# Patient Record
Sex: Female | Born: 1955
Health system: Southern US, Community
[De-identification: ages and names within clinical notes are randomized; demographics above are authoritative.]

## PROBLEM LIST (undated history)

## (undated) DIAGNOSIS — E559 Vitamin D deficiency, unspecified: Secondary | ICD-10-CM

## (undated) DIAGNOSIS — N6310 Unspecified lump in the right breast, unspecified quadrant: Secondary | ICD-10-CM

## (undated) DIAGNOSIS — F32A Depression, unspecified: Secondary | ICD-10-CM

## (undated) DIAGNOSIS — I739 Peripheral vascular disease, unspecified: Secondary | ICD-10-CM

## (undated) DIAGNOSIS — E039 Hypothyroidism, unspecified: Secondary | ICD-10-CM

## (undated) DIAGNOSIS — I10 Essential (primary) hypertension: Secondary | ICD-10-CM

## (undated) DIAGNOSIS — E119 Type 2 diabetes mellitus without complications: Secondary | ICD-10-CM

## (undated) DIAGNOSIS — F419 Anxiety disorder, unspecified: Secondary | ICD-10-CM

## (undated) DIAGNOSIS — C801 Malignant (primary) neoplasm, unspecified: Secondary | ICD-10-CM

## (undated) DIAGNOSIS — G629 Polyneuropathy, unspecified: Secondary | ICD-10-CM

## (undated) DIAGNOSIS — E785 Hyperlipidemia, unspecified: Secondary | ICD-10-CM

## (undated) DIAGNOSIS — F329 Major depressive disorder, single episode, unspecified: Secondary | ICD-10-CM

## (undated) DIAGNOSIS — K5792 Diverticulitis of intestine, part unspecified, without perforation or abscess without bleeding: Secondary | ICD-10-CM

## (undated) HISTORY — DX: Vitamin D deficiency, unspecified: E55.9

## (undated) HISTORY — DX: Polyneuropathy, unspecified: G62.9

## (undated) HISTORY — PX: BREAST EXCISIONAL BIOPSY: SUR124

## (undated) HISTORY — DX: Malignant (primary) neoplasm, unspecified: C80.1

## (undated) HISTORY — DX: Peripheral vascular disease, unspecified: I73.9

## (undated) HISTORY — PX: CHOLECYSTECTOMY: SHX55

## (undated) HISTORY — DX: Diverticulitis of intestine, part unspecified, without perforation or abscess without bleeding: K57.92

---

## 1997-07-28 ENCOUNTER — Ambulatory Visit (HOSPITAL_COMMUNITY): Admission: RE | Admit: 1997-07-28 | Discharge: 1997-07-28 | Payer: Self-pay | Admitting: Gynecology

## 1998-05-20 ENCOUNTER — Other Ambulatory Visit: Admission: RE | Admit: 1998-05-20 | Discharge: 1998-05-20 | Payer: Self-pay | Admitting: Gynecology

## 1999-05-26 ENCOUNTER — Encounter: Admission: RE | Admit: 1999-05-26 | Discharge: 1999-05-26 | Payer: Self-pay | Admitting: Gynecology

## 1999-05-26 ENCOUNTER — Encounter: Payer: Self-pay | Admitting: Gynecology

## 1999-05-26 ENCOUNTER — Other Ambulatory Visit: Admission: RE | Admit: 1999-05-26 | Discharge: 1999-05-26 | Payer: Self-pay | Admitting: Gynecology

## 1999-06-02 ENCOUNTER — Encounter: Admission: RE | Admit: 1999-06-02 | Discharge: 1999-06-02 | Payer: Self-pay | Admitting: Gynecology

## 1999-06-02 ENCOUNTER — Encounter: Payer: Self-pay | Admitting: Gynecology

## 2000-06-21 ENCOUNTER — Encounter: Payer: Self-pay | Admitting: Gynecology

## 2000-06-21 ENCOUNTER — Other Ambulatory Visit: Admission: RE | Admit: 2000-06-21 | Discharge: 2000-06-21 | Payer: Self-pay | Admitting: Gynecology

## 2000-06-21 ENCOUNTER — Encounter: Admission: RE | Admit: 2000-06-21 | Discharge: 2000-06-21 | Payer: Self-pay | Admitting: Gynecology

## 2001-08-01 ENCOUNTER — Encounter: Payer: Self-pay | Admitting: Gynecology

## 2001-08-01 ENCOUNTER — Encounter: Admission: RE | Admit: 2001-08-01 | Discharge: 2001-08-01 | Payer: Self-pay | Admitting: Gynecology

## 2001-08-01 ENCOUNTER — Other Ambulatory Visit: Admission: RE | Admit: 2001-08-01 | Discharge: 2001-08-01 | Payer: Self-pay | Admitting: Gynecology

## 2001-11-13 ENCOUNTER — Ambulatory Visit (HOSPITAL_COMMUNITY): Admission: RE | Admit: 2001-11-13 | Discharge: 2001-11-13 | Payer: Self-pay | Admitting: Gastroenterology

## 2002-08-21 ENCOUNTER — Encounter: Admission: RE | Admit: 2002-08-21 | Discharge: 2002-08-21 | Payer: Self-pay | Admitting: Gynecology

## 2002-08-21 ENCOUNTER — Encounter: Payer: Self-pay | Admitting: Gynecology

## 2002-08-21 ENCOUNTER — Other Ambulatory Visit: Admission: RE | Admit: 2002-08-21 | Discharge: 2002-08-21 | Payer: Self-pay | Admitting: Gynecology

## 2003-09-03 ENCOUNTER — Other Ambulatory Visit: Admission: RE | Admit: 2003-09-03 | Discharge: 2003-09-03 | Payer: Self-pay | Admitting: Gynecology

## 2003-09-03 ENCOUNTER — Encounter: Admission: RE | Admit: 2003-09-03 | Discharge: 2003-09-03 | Payer: Self-pay | Admitting: Gynecology

## 2004-09-08 ENCOUNTER — Other Ambulatory Visit: Admission: RE | Admit: 2004-09-08 | Discharge: 2004-09-08 | Payer: Self-pay | Admitting: Gynecology

## 2004-09-08 ENCOUNTER — Encounter: Admission: RE | Admit: 2004-09-08 | Discharge: 2004-09-08 | Payer: Self-pay | Admitting: Gynecology

## 2004-11-23 ENCOUNTER — Encounter: Admission: RE | Admit: 2004-11-23 | Discharge: 2004-11-23 | Payer: Self-pay | Admitting: Gynecology

## 2005-09-14 ENCOUNTER — Other Ambulatory Visit: Admission: RE | Admit: 2005-09-14 | Discharge: 2005-09-14 | Payer: Self-pay | Admitting: Gynecology

## 2005-09-21 ENCOUNTER — Encounter: Admission: RE | Admit: 2005-09-21 | Discharge: 2005-09-21 | Payer: Self-pay | Admitting: Gynecology

## 2005-10-05 ENCOUNTER — Encounter: Admission: RE | Admit: 2005-10-05 | Discharge: 2005-10-05 | Payer: Self-pay | Admitting: Gynecology

## 2006-10-04 ENCOUNTER — Encounter: Admission: RE | Admit: 2006-10-04 | Discharge: 2006-10-04 | Payer: Self-pay | Admitting: Gynecology

## 2006-10-04 ENCOUNTER — Other Ambulatory Visit: Admission: RE | Admit: 2006-10-04 | Discharge: 2006-10-04 | Payer: Self-pay | Admitting: Gynecology

## 2007-10-17 ENCOUNTER — Encounter: Admission: RE | Admit: 2007-10-17 | Discharge: 2007-10-17 | Payer: Self-pay | Admitting: Gynecology

## 2008-10-22 ENCOUNTER — Encounter: Admission: RE | Admit: 2008-10-22 | Discharge: 2008-10-22 | Payer: Self-pay | Admitting: Gynecology

## 2009-12-01 ENCOUNTER — Encounter: Admission: RE | Admit: 2009-12-01 | Discharge: 2009-12-01 | Payer: Self-pay | Admitting: Gynecology

## 2010-02-11 ENCOUNTER — Encounter: Payer: Self-pay | Admitting: Gynecology

## 2010-11-06 ENCOUNTER — Other Ambulatory Visit: Payer: Self-pay | Admitting: Gynecology

## 2010-11-06 DIAGNOSIS — Z1231 Encounter for screening mammogram for malignant neoplasm of breast: Secondary | ICD-10-CM

## 2010-12-08 ENCOUNTER — Ambulatory Visit
Admission: RE | Admit: 2010-12-08 | Discharge: 2010-12-08 | Disposition: A | Discharge status: Home / Self Care | Payer: BC Managed Care – PPO | Source: Ambulatory Visit | Attending: Gynecology | Admitting: Gynecology

## 2010-12-08 DIAGNOSIS — Z1231 Encounter for screening mammogram for malignant neoplasm of breast: Secondary | ICD-10-CM

## 2011-11-27 ENCOUNTER — Other Ambulatory Visit: Payer: Self-pay | Admitting: Gynecology

## 2011-11-27 DIAGNOSIS — Z1231 Encounter for screening mammogram for malignant neoplasm of breast: Secondary | ICD-10-CM

## 2011-12-14 ENCOUNTER — Ambulatory Visit
Admission: RE | Admit: 2011-12-14 | Discharge: 2011-12-14 | Disposition: A | Discharge status: Home / Self Care | Payer: BC Managed Care – PPO | Source: Ambulatory Visit | Attending: Gynecology | Admitting: Gynecology

## 2011-12-14 DIAGNOSIS — Z1231 Encounter for screening mammogram for malignant neoplasm of breast: Secondary | ICD-10-CM

## 2012-11-25 ENCOUNTER — Other Ambulatory Visit: Payer: Self-pay

## 2012-11-25 DIAGNOSIS — Z1231 Encounter for screening mammogram for malignant neoplasm of breast: Secondary | ICD-10-CM

## 2012-12-26 ENCOUNTER — Ambulatory Visit
Admission: RE | Admit: 2012-12-26 | Discharge: 2012-12-26 | Disposition: A | Discharge status: Home / Self Care | Payer: BC Managed Care – PPO | Source: Ambulatory Visit

## 2012-12-26 DIAGNOSIS — Z1231 Encounter for screening mammogram for malignant neoplasm of breast: Secondary | ICD-10-CM

## 2013-10-14 ENCOUNTER — Other Ambulatory Visit: Payer: Self-pay | Admitting: Legal Medicine

## 2013-10-14 DIAGNOSIS — K828 Other specified diseases of gallbladder: Secondary | ICD-10-CM

## 2013-10-16 ENCOUNTER — Ambulatory Visit
Admission: RE | Admit: 2013-10-16 | Discharge: 2013-10-16 | Disposition: A | Discharge status: Home / Self Care | Payer: BC Managed Care – PPO | Source: Ambulatory Visit | Attending: Legal Medicine | Admitting: Legal Medicine

## 2013-10-16 DIAGNOSIS — K828 Other specified diseases of gallbladder: Secondary | ICD-10-CM

## 2014-06-14 ENCOUNTER — Other Ambulatory Visit (INDEPENDENT_AMBULATORY_CARE_PROVIDER_SITE_OTHER): Payer: Self-pay

## 2014-06-14 DIAGNOSIS — N6320 Unspecified lump in the left breast, unspecified quadrant: Secondary | ICD-10-CM

## 2014-06-15 ENCOUNTER — Ambulatory Visit
Admission: RE | Admit: 2014-06-15 | Discharge: 2014-06-15 | Disposition: A | Discharge status: Home / Self Care | Payer: BLUE CROSS/BLUE SHIELD | Source: Ambulatory Visit | Attending: General Surgery | Admitting: General Surgery

## 2014-06-15 ENCOUNTER — Ambulatory Visit: Admission: RE | Admit: 2014-06-15 | Payer: BLUE CROSS/BLUE SHIELD | Source: Ambulatory Visit

## 2014-06-15 DIAGNOSIS — N6320 Unspecified lump in the left breast, unspecified quadrant: Secondary | ICD-10-CM

## 2014-12-31 ENCOUNTER — Other Ambulatory Visit: Payer: Self-pay | Admitting: Obstetrics and Gynecology

## 2014-12-31 ENCOUNTER — Other Ambulatory Visit (HOSPITAL_COMMUNITY)
Admission: RE | Admit: 2014-12-31 | Discharge: 2014-12-31 | Disposition: A | Discharge status: Home / Self Care | Payer: BLUE CROSS/BLUE SHIELD | Source: Ambulatory Visit | Attending: Obstetrics and Gynecology | Admitting: Obstetrics and Gynecology

## 2014-12-31 DIAGNOSIS — Z01419 Encounter for gynecological examination (general) (routine) without abnormal findings: Secondary | ICD-10-CM | POA: Insufficient documentation

## 2014-12-31 DIAGNOSIS — Z1151 Encounter for screening for human papillomavirus (HPV): Secondary | ICD-10-CM | POA: Diagnosis not present

## 2015-01-03 LAB — CYTOLOGY - PAP

## 2015-05-13 DIAGNOSIS — H16223 Keratoconjunctivitis sicca, not specified as Sjogren's, bilateral: Secondary | ICD-10-CM | POA: Diagnosis not present

## 2015-05-13 DIAGNOSIS — H40013 Open angle with borderline findings, low risk, bilateral: Secondary | ICD-10-CM | POA: Diagnosis not present

## 2015-05-13 DIAGNOSIS — H35362 Drusen (degenerative) of macula, left eye: Secondary | ICD-10-CM | POA: Diagnosis not present

## 2015-05-26 ENCOUNTER — Other Ambulatory Visit: Payer: Self-pay

## 2015-05-26 DIAGNOSIS — Z1231 Encounter for screening mammogram for malignant neoplasm of breast: Secondary | ICD-10-CM

## 2015-06-17 ENCOUNTER — Ambulatory Visit
Admission: RE | Admit: 2015-06-17 | Discharge: 2015-06-17 | Disposition: A | Discharge status: Home / Self Care | Payer: BLUE CROSS/BLUE SHIELD | Source: Ambulatory Visit

## 2015-06-17 DIAGNOSIS — I10 Essential (primary) hypertension: Secondary | ICD-10-CM | POA: Diagnosis not present

## 2015-06-17 DIAGNOSIS — E039 Hypothyroidism, unspecified: Secondary | ICD-10-CM | POA: Diagnosis not present

## 2015-06-17 DIAGNOSIS — E785 Hyperlipidemia, unspecified: Secondary | ICD-10-CM | POA: Diagnosis not present

## 2015-06-17 DIAGNOSIS — Z1231 Encounter for screening mammogram for malignant neoplasm of breast: Secondary | ICD-10-CM | POA: Diagnosis not present

## 2015-06-17 DIAGNOSIS — R7303 Prediabetes: Secondary | ICD-10-CM | POA: Diagnosis not present

## 2015-06-22 ENCOUNTER — Other Ambulatory Visit: Payer: Self-pay | Admitting: *Deleted

## 2015-06-22 ENCOUNTER — Other Ambulatory Visit: Payer: Self-pay | Admitting: General Surgery

## 2015-06-22 DIAGNOSIS — R928 Other abnormal and inconclusive findings on diagnostic imaging of breast: Secondary | ICD-10-CM

## 2015-06-24 ENCOUNTER — Ambulatory Visit
Admission: RE | Admit: 2015-06-24 | Discharge: 2015-06-24 | Disposition: A | Discharge status: Home / Self Care | Payer: BLUE CROSS/BLUE SHIELD | Source: Ambulatory Visit | Attending: General Surgery | Admitting: General Surgery

## 2015-06-24 ENCOUNTER — Other Ambulatory Visit: Payer: Self-pay | Admitting: General Surgery

## 2015-06-24 DIAGNOSIS — R928 Other abnormal and inconclusive findings on diagnostic imaging of breast: Secondary | ICD-10-CM

## 2015-06-24 DIAGNOSIS — N63 Unspecified lump in breast: Secondary | ICD-10-CM | POA: Diagnosis not present

## 2015-06-24 DIAGNOSIS — R922 Inconclusive mammogram: Secondary | ICD-10-CM | POA: Diagnosis not present

## 2015-07-01 ENCOUNTER — Other Ambulatory Visit: Payer: Self-pay | Admitting: General Surgery

## 2015-07-01 DIAGNOSIS — R928 Other abnormal and inconclusive findings on diagnostic imaging of breast: Secondary | ICD-10-CM

## 2015-07-04 ENCOUNTER — Ambulatory Visit
Admission: RE | Admit: 2015-07-04 | Discharge: 2015-07-04 | Disposition: A | Discharge status: Home / Self Care | Payer: BLUE CROSS/BLUE SHIELD | Source: Ambulatory Visit | Attending: General Surgery | Admitting: General Surgery

## 2015-07-04 ENCOUNTER — Other Ambulatory Visit: Payer: Self-pay | Admitting: Obstetrics & Gynecology

## 2015-07-04 DIAGNOSIS — N63 Unspecified lump in breast: Secondary | ICD-10-CM | POA: Diagnosis not present

## 2015-07-04 DIAGNOSIS — D241 Benign neoplasm of right breast: Secondary | ICD-10-CM | POA: Diagnosis not present

## 2015-07-04 DIAGNOSIS — N6011 Diffuse cystic mastopathy of right breast: Secondary | ICD-10-CM | POA: Diagnosis not present

## 2015-07-04 DIAGNOSIS — N6489 Other specified disorders of breast: Secondary | ICD-10-CM | POA: Diagnosis not present

## 2015-07-04 DIAGNOSIS — R928 Other abnormal and inconclusive findings on diagnostic imaging of breast: Secondary | ICD-10-CM

## 2015-07-05 ENCOUNTER — Other Ambulatory Visit: Payer: Self-pay | Admitting: Obstetrics & Gynecology

## 2015-07-05 DIAGNOSIS — N6310 Unspecified lump in the right breast, unspecified quadrant: Secondary | ICD-10-CM

## 2015-07-06 ENCOUNTER — Other Ambulatory Visit: Payer: Self-pay | Admitting: Obstetrics & Gynecology

## 2015-07-06 DIAGNOSIS — N6310 Unspecified lump in the right breast, unspecified quadrant: Secondary | ICD-10-CM

## 2015-07-07 ENCOUNTER — Ambulatory Visit
Admission: RE | Admit: 2015-07-07 | Discharge: 2015-07-07 | Disposition: A | Discharge status: Home / Self Care | Payer: BLUE CROSS/BLUE SHIELD | Source: Ambulatory Visit | Attending: Obstetrics & Gynecology | Admitting: Obstetrics & Gynecology

## 2015-07-07 DIAGNOSIS — N6310 Unspecified lump in the right breast, unspecified quadrant: Secondary | ICD-10-CM

## 2015-07-07 DIAGNOSIS — N6011 Diffuse cystic mastopathy of right breast: Secondary | ICD-10-CM | POA: Diagnosis not present

## 2015-07-07 DIAGNOSIS — N649 Disorder of breast, unspecified: Secondary | ICD-10-CM | POA: Diagnosis not present

## 2015-07-29 ENCOUNTER — Other Ambulatory Visit: Payer: Self-pay | Admitting: General Surgery

## 2015-07-29 DIAGNOSIS — N6489 Other specified disorders of breast: Secondary | ICD-10-CM

## 2015-08-08 ENCOUNTER — Other Ambulatory Visit: Payer: Self-pay | Admitting: General Surgery

## 2015-08-08 DIAGNOSIS — N6489 Other specified disorders of breast: Secondary | ICD-10-CM

## 2015-09-20 ENCOUNTER — Encounter (HOSPITAL_BASED_OUTPATIENT_CLINIC_OR_DEPARTMENT_OTHER): Payer: Self-pay | Admitting: *Deleted

## 2015-09-22 ENCOUNTER — Other Ambulatory Visit: Payer: Self-pay

## 2015-09-22 ENCOUNTER — Ambulatory Visit
Admission: RE | Admit: 2015-09-22 | Discharge: 2015-09-22 | Disposition: A | Discharge status: Home / Self Care | Payer: BLUE CROSS/BLUE SHIELD | Source: Ambulatory Visit | Attending: General Surgery | Admitting: General Surgery

## 2015-09-22 ENCOUNTER — Encounter (HOSPITAL_BASED_OUTPATIENT_CLINIC_OR_DEPARTMENT_OTHER)
Admission: RE | Admit: 2015-09-22 | Discharge: 2015-09-22 | Disposition: A | Discharge status: Home / Self Care | Payer: BLUE CROSS/BLUE SHIELD | Source: Ambulatory Visit | Attending: General Surgery | Admitting: General Surgery

## 2015-09-22 DIAGNOSIS — N6489 Other specified disorders of breast: Secondary | ICD-10-CM

## 2015-09-22 DIAGNOSIS — R928 Other abnormal and inconclusive findings on diagnostic imaging of breast: Secondary | ICD-10-CM | POA: Diagnosis not present

## 2015-09-22 DIAGNOSIS — E119 Type 2 diabetes mellitus without complications: Secondary | ICD-10-CM | POA: Diagnosis not present

## 2015-09-22 DIAGNOSIS — E7800 Pure hypercholesterolemia, unspecified: Secondary | ICD-10-CM | POA: Diagnosis not present

## 2015-09-22 DIAGNOSIS — F419 Anxiety disorder, unspecified: Secondary | ICD-10-CM | POA: Diagnosis not present

## 2015-09-22 DIAGNOSIS — E039 Hypothyroidism, unspecified: Secondary | ICD-10-CM | POA: Diagnosis not present

## 2015-09-22 DIAGNOSIS — N6021 Fibroadenosis of right breast: Secondary | ICD-10-CM | POA: Diagnosis not present

## 2015-09-22 DIAGNOSIS — Z7984 Long term (current) use of oral hypoglycemic drugs: Secondary | ICD-10-CM | POA: Diagnosis not present

## 2015-09-22 DIAGNOSIS — Z7982 Long term (current) use of aspirin: Secondary | ICD-10-CM | POA: Diagnosis not present

## 2015-09-22 DIAGNOSIS — Z85820 Personal history of malignant melanoma of skin: Secondary | ICD-10-CM | POA: Diagnosis not present

## 2015-09-22 LAB — BASIC METABOLIC PANEL
Anion gap: 9 (ref 5–15)
BUN: 12 mg/dL (ref 6–20)
CO2: 24 mmol/L (ref 22–32)
Calcium: 9.4 mg/dL (ref 8.9–10.3)
Chloride: 101 mmol/L (ref 101–111)
Creatinine, Ser: 0.85 mg/dL (ref 0.44–1.00)
GFR calc Af Amer: >60 mL/min (ref >60)
GFR calc non Af Amer: >60 mL/min (ref >60)
Glucose, Bld: 104 mg/dL — ABNORMAL HIGH (ref 65–99)
Potassium: 4.1 mmol/L (ref 3.5–5.1)
Sodium: 134 mmol/L — ABNORMAL LOW (ref 135–145)

## 2015-09-22 NOTE — Progress Notes (Signed)
Pt given 8 oz bottle of water with written and verbal instructions to drink at 530 morning of surgery and no other liquids after midnight.  Teach back and pt voiced understanding

## 2015-09-22 NOTE — H&P (Signed)
History of Present Illness   Patient words: Cassandra Fox right breast.  The patient is a 60 year old female who presents with a complaint of Breast problems. She returns to the office after recent breast biopsies. I saw her in May with some swelling and tenderness in the left breast consistent with fibrocystic disease. Subsequent imaging and ultrasound were negative and this resolved. However she recently presented for routine screening in early June. Possible distortion was seen in the right breast prompting diagnostic studies. Tomosynthesis showed an area of distortion in the outer right breast as well as a rounded mass in the lateral portion of the right breast. Subsequently these areas were biopsied with ultrasound and stereotactic biopsy and showed benign fibrocystic change and a fibroadenoma. Subsequently then diagnostic post biopsy mammogram was obtained showing a coil marker in the area of distortion and a ribbon marker at the site of the fibroadenoma. However a another area of distortion was seen about 2 cm posterior and medial to the coil shaped marker. On 6:15 the patient had an additional biopsy of this mower posterior area. The original result of fibrocystic change was also felt to be discordant. This was done with an X-shaped clip. Pathology has revealed return showing a complex sclerosing lesion. She is referred for consideration for excisional biopsy. As noted previously she has no previous history of breast biopsies or specific problems other than fibrocystic symptoms. No family history of breast cancer.   Problem List/Past Medical  LEFT BREAST MASS (N63)  Other Problems   Melanoma Thyroid Disease Hypercholesterolemia Cholelithiasis Hemorrhoids High blood pressure Anxiety Disorder  Past Surgical History  Gallbladder Surgery - Open  Diagnostic Studies History  Colonoscopy 1-5 years ago Mammogram 1-3 years ago  Allergies  Codeine Sulfate *ANALGESICS -  OPIOID* Nausea.   Medication History   Losartan Potassium-HCTZ (50-12.5MG  Tablet, Oral) Active. Simvastatin (20MG  Tablet, Oral daily) Active. Aspirin (81MG  Tablet, Oral) Active. Citalopram Hydrobromide (20MG  Tablet, Oral) Active. Levothyroxine Sodium (100MCG Tablet, Oral half a tablet a day) Active. Minivelle (0.075MG /24HR Patch TW, Transdermal) Active. Xanax (0.5MG  Tablet, Oral) Active. Progesterone Micronized (200MG  Capsule, Oral) Active. Multivitamin Adult (Oral) Active. MetFORMIN HCl (500MG  Tablet, Oral) Active. Claritin (10MG  Capsule, Oral) Active. Medications Reconciled  Social History   Caffeine use Carbonated beverages, Coffee, Tea. No drug use Tobacco use Current every day smoker. Alcohol use Occasional alcohol use.  Family History   Colon Cancer Mother. Colon Polyps Brother, Sister. Diabetes Mellitus Mother. Melanoma Daughter, Father. Thyroid problems Mother.  Pregnancy / Birth History  Age at menarche 80 years. Maternal age 43-30 Para 35 Age of menopause 58-50 Contraceptive History Oral contraceptives. Gravida 3  Vitals   Weight: 155.8 lb Height: 65in Body Surface Area: 1.78 m Body Mass Index: 25.93 kg/m  Temp.: 17F  Pulse: 62 (Regular)  BP: 128/72 (Sitting, Left Arm, Standard)       Physical Exam   The physical exam findings are as follows: Note:General: Alert, well-developed and well nourished Caucasian female, in no distress Skin: Warm and dry without rash or infection. HEENT: No palpable masses or thyromegaly. Sclera nonicteric. Lymph nodes: No cervical, supraclavicular, nodes palpable. Breasts: Some bruising and nodularity outer right breast likely postbiopsy change. No other palpable masses in the breast or skin changes or nipple discharge. Lungs: Breath sounds clear and equal. No wheezing or increased work of breathing. Cardiovascular: Regular rate and rhythm without murmer. No JVD or  edema. Extremities: No edema or joint swelling or deformity. No chronic venous stasis changes. Neurologic: Alert and  fully oriented. Gait normal. No focal weakness. Psychiatric: Normal mood and affect. Thought content appropriate with normal judgement and insight    Assessment & Plan   RADIAL SCAR OF BREAST (N64.89) Impression: Radial scar or complex sclerosing lesion right breast status post 3 recent core biopsies. The area of concern is posterior in the outer breast with an X shaped clip. I discussed the findings in detail with the patient. We discussed the small chance of an underlying malignancy and that the standard recommendation is excisional biopsy. We discussed options of clinical follow-up. She prefers excision. We discussed the nature of the surgery and recovery and risks of anesthetic complications, bleeding, infection or possible need for further surgery if a malignancy were found. All her questions were answered. Current Plans Pt Education - [facility] Breast Biopsy HCI: discussed with patient and provided information. Radioactive seed localized right breast lumpectomy under general anesthesia as an outpatient

## 2015-09-23 ENCOUNTER — Encounter (HOSPITAL_BASED_OUTPATIENT_CLINIC_OR_DEPARTMENT_OTHER)
Admission: RE | Disposition: A | Discharge status: Home / Self Care | Payer: Self-pay | Source: Ambulatory Visit | Attending: General Surgery

## 2015-09-23 ENCOUNTER — Ambulatory Visit
Admission: RE | Admit: 2015-09-23 | Discharge: 2015-09-23 | Disposition: A | Discharge status: Home / Self Care | Payer: BLUE CROSS/BLUE SHIELD | Source: Ambulatory Visit | Attending: General Surgery | Admitting: General Surgery

## 2015-09-23 ENCOUNTER — Ambulatory Visit (HOSPITAL_BASED_OUTPATIENT_CLINIC_OR_DEPARTMENT_OTHER)
Admission: RE | Admit: 2015-09-23 | Discharge: 2015-09-23 | Disposition: A | Discharge status: Home / Self Care | Payer: BLUE CROSS/BLUE SHIELD | Source: Ambulatory Visit | Attending: General Surgery | Admitting: General Surgery

## 2015-09-23 ENCOUNTER — Ambulatory Visit (HOSPITAL_BASED_OUTPATIENT_CLINIC_OR_DEPARTMENT_OTHER): Payer: BLUE CROSS/BLUE SHIELD | Admitting: Certified Registered"

## 2015-09-23 ENCOUNTER — Encounter (HOSPITAL_BASED_OUTPATIENT_CLINIC_OR_DEPARTMENT_OTHER): Payer: Self-pay | Admitting: Certified Registered"

## 2015-09-23 DIAGNOSIS — F419 Anxiety disorder, unspecified: Secondary | ICD-10-CM | POA: Diagnosis not present

## 2015-09-23 DIAGNOSIS — N6489 Other specified disorders of breast: Secondary | ICD-10-CM

## 2015-09-23 DIAGNOSIS — R928 Other abnormal and inconclusive findings on diagnostic imaging of breast: Secondary | ICD-10-CM | POA: Diagnosis not present

## 2015-09-23 DIAGNOSIS — E039 Hypothyroidism, unspecified: Secondary | ICD-10-CM | POA: Insufficient documentation

## 2015-09-23 DIAGNOSIS — N6021 Fibroadenosis of right breast: Secondary | ICD-10-CM | POA: Diagnosis not present

## 2015-09-23 DIAGNOSIS — E7800 Pure hypercholesterolemia, unspecified: Secondary | ICD-10-CM | POA: Diagnosis not present

## 2015-09-23 DIAGNOSIS — R920 Mammographic microcalcification found on diagnostic imaging of breast: Secondary | ICD-10-CM | POA: Diagnosis not present

## 2015-09-23 DIAGNOSIS — Z7984 Long term (current) use of oral hypoglycemic drugs: Secondary | ICD-10-CM | POA: Insufficient documentation

## 2015-09-23 DIAGNOSIS — Z7982 Long term (current) use of aspirin: Secondary | ICD-10-CM | POA: Insufficient documentation

## 2015-09-23 DIAGNOSIS — Z85820 Personal history of malignant melanoma of skin: Secondary | ICD-10-CM | POA: Diagnosis not present

## 2015-09-23 DIAGNOSIS — N6011 Diffuse cystic mastopathy of right breast: Secondary | ICD-10-CM | POA: Diagnosis not present

## 2015-09-23 DIAGNOSIS — E119 Type 2 diabetes mellitus without complications: Secondary | ICD-10-CM | POA: Insufficient documentation

## 2015-09-23 HISTORY — PX: BREAST LUMPECTOMY WITH RADIOACTIVE SEED LOCALIZATION: SHX6424

## 2015-09-23 HISTORY — DX: Unspecified lump in the right breast, unspecified quadrant: N63.10

## 2015-09-23 HISTORY — DX: Depression, unspecified: F32.A

## 2015-09-23 HISTORY — DX: Hypothyroidism, unspecified: E03.9

## 2015-09-23 HISTORY — DX: Hyperlipidemia, unspecified: E78.5

## 2015-09-23 HISTORY — DX: Anxiety disorder, unspecified: F41.9

## 2015-09-23 HISTORY — DX: Type 2 diabetes mellitus without complications: E11.9

## 2015-09-23 HISTORY — DX: Essential (primary) hypertension: I10

## 2015-09-23 HISTORY — DX: Major depressive disorder, single episode, unspecified: F32.9

## 2015-09-23 LAB — GLUCOSE, CAPILLARY
Glucose-Capillary: 107 mg/dL — ABNORMAL HIGH (ref 65–99)
Glucose-Capillary: 138 mg/dL — ABNORMAL HIGH (ref 65–99)

## 2015-09-23 SURGERY — BREAST LUMPECTOMY WITH RADIOACTIVE SEED LOCALIZATION
Anesthesia: General | Laterality: Right

## 2015-09-23 MED ORDER — PROPOFOL 10 MG/ML IV BOLUS
INTRAVENOUS | Status: DC | PRN
  Administered 2015-09-23: 150 mg via INTRAVENOUS

## 2015-09-23 MED ORDER — HYDROCODONE-ACETAMINOPHEN 5-325 MG PO TABS: 1.0000 | ORAL_TABLET | ORAL | 0 refills | Status: DC | PRN

## 2015-09-23 MED ORDER — LIDOCAINE 2% (20 MG/ML) 5 ML SYRINGE
INTRAMUSCULAR | Status: AC
Start: 2015-09-23 — End: 2015-09-23
  Filled 2015-09-23: qty 5

## 2015-09-23 MED ORDER — FENTANYL CITRATE (PF) 100 MCG/2ML IJ SOLN
50.0000 ug | INTRAMUSCULAR | Status: DC | PRN
  Administered 2015-09-23 (×2): 50 ug via INTRAVENOUS

## 2015-09-23 MED ORDER — MIDAZOLAM HCL 2 MG/2ML IJ SOLN: 1.0000 mg | INTRAMUSCULAR | Status: DC | PRN

## 2015-09-23 MED ORDER — GABAPENTIN 300 MG PO CAPS
300.0000 mg | ORAL_CAPSULE | ORAL | Status: AC
  Administered 2015-09-23: 300 mg via ORAL

## 2015-09-23 MED ORDER — CHLORHEXIDINE GLUCONATE CLOTH 2 % EX PADS: 6.0000 | MEDICATED_PAD | Freq: Once | CUTANEOUS | Status: DC

## 2015-09-23 MED ORDER — CELECOXIB 200 MG PO CAPS
ORAL_CAPSULE | ORAL | Status: AC
  Filled 2015-09-23: qty 2

## 2015-09-23 MED ORDER — PROPOFOL 10 MG/ML IV BOLUS
INTRAVENOUS | Status: AC
  Filled 2015-09-23: qty 20

## 2015-09-23 MED ORDER — ONDANSETRON HCL 4 MG/2ML IJ SOLN
INTRAMUSCULAR | Status: DC | PRN
  Administered 2015-09-23: 4 mg via INTRAVENOUS

## 2015-09-23 MED ORDER — FENTANYL CITRATE (PF) 100 MCG/2ML IJ SOLN
INTRAMUSCULAR | Status: AC
  Filled 2015-09-23: qty 2

## 2015-09-23 MED ORDER — SCOPOLAMINE 1 MG/3DAYS TD PT72: 1.0000 | MEDICATED_PATCH | Freq: Once | TRANSDERMAL | Status: DC | PRN

## 2015-09-23 MED ORDER — DEXAMETHASONE SODIUM PHOSPHATE 4 MG/ML IJ SOLN
INTRAMUSCULAR | Status: DC | PRN
  Administered 2015-09-23: 4 mg via INTRAVENOUS

## 2015-09-23 MED ORDER — GLYCOPYRROLATE 0.2 MG/ML IJ SOLN
0.2000 mg | Freq: Once | INTRAMUSCULAR | Status: AC | PRN
  Administered 2015-09-23: 0.1 mg via INTRAVENOUS

## 2015-09-23 MED ORDER — CELECOXIB 400 MG PO CAPS
400.0000 mg | ORAL_CAPSULE | ORAL | Status: AC
  Administered 2015-09-23: 400 mg via ORAL

## 2015-09-23 MED ORDER — BUPIVACAINE-EPINEPHRINE (PF) 0.25% -1:200000 IJ SOLN
INTRAMUSCULAR | Status: AC
  Filled 2015-09-23: qty 30

## 2015-09-23 MED ORDER — CEFAZOLIN SODIUM-DEXTROSE 2-4 GM/100ML-% IV SOLN
INTRAVENOUS | Status: AC
  Filled 2015-09-23: qty 100

## 2015-09-23 MED ORDER — OXYCODONE HCL 5 MG PO TABS
5.0000 mg | ORAL_TABLET | Freq: Once | ORAL | Status: AC
  Administered 2015-09-23: 5 mg via ORAL

## 2015-09-23 MED ORDER — BUPIVACAINE-EPINEPHRINE (PF) 0.25% -1:200000 IJ SOLN
INTRAMUSCULAR | Status: DC | PRN
  Administered 2015-09-23: 20 mL via PERINEURAL

## 2015-09-23 MED ORDER — DEXAMETHASONE SODIUM PHOSPHATE 10 MG/ML IJ SOLN
INTRAMUSCULAR | Status: AC
  Filled 2015-09-23: qty 1

## 2015-09-23 MED ORDER — LIDOCAINE HCL (CARDIAC) 20 MG/ML IV SOLN
INTRAVENOUS | Status: DC | PRN
  Administered 2015-09-23: 60 mg via INTRAVENOUS
  Administered 2015-09-23: 50 mg via INTRAVENOUS

## 2015-09-23 MED ORDER — ACETAMINOPHEN 500 MG PO TABS
ORAL_TABLET | ORAL | Status: AC
Start: 2015-09-23 — End: 2015-09-23
  Filled 2015-09-23: qty 2

## 2015-09-23 MED ORDER — FENTANYL CITRATE (PF) 100 MCG/2ML IJ SOLN
25.0000 ug | INTRAMUSCULAR | Status: DC | PRN
  Administered 2015-09-23: 50 ug via INTRAVENOUS

## 2015-09-23 MED ORDER — MIDAZOLAM HCL 2 MG/2ML IJ SOLN
INTRAMUSCULAR | Status: AC
  Filled 2015-09-23: qty 2

## 2015-09-23 MED ORDER — LACTATED RINGERS IV SOLN
INTRAVENOUS | Status: DC
  Administered 2015-09-23 (×2): via INTRAVENOUS

## 2015-09-23 MED ORDER — CEFAZOLIN SODIUM-DEXTROSE 2-4 GM/100ML-% IV SOLN
2.0000 g | INTRAVENOUS | Status: AC
Start: 2015-09-23 — End: 2015-09-23
  Administered 2015-09-23: 2 g via INTRAVENOUS

## 2015-09-23 MED ORDER — OXYCODONE HCL 5 MG PO TABS
ORAL_TABLET | ORAL | Status: AC
  Filled 2015-09-23: qty 1

## 2015-09-23 MED ORDER — ACETAMINOPHEN 500 MG PO TABS
1000.0000 mg | ORAL_TABLET | ORAL | Status: AC
  Administered 2015-09-23: 1000 mg via ORAL

## 2015-09-23 MED ORDER — PROMETHAZINE HCL 25 MG/ML IJ SOLN: 6.2500 mg | INTRAMUSCULAR | Status: DC | PRN

## 2015-09-23 MED ORDER — GABAPENTIN 300 MG PO CAPS
ORAL_CAPSULE | ORAL | Status: AC
Start: 2015-09-23 — End: 2015-09-23
  Filled 2015-09-23: qty 1

## 2015-09-23 MED ORDER — ONDANSETRON HCL 4 MG/2ML IJ SOLN
INTRAMUSCULAR | Status: AC
Start: 2015-09-23 — End: 2015-09-23
  Filled 2015-09-23: qty 2

## 2015-09-23 SURGICAL SUPPLY — 43 items
BINDER BREAST LRG (GAUZE/BANDAGES/DRESSINGS) IMPLANT
BINDER BREAST MEDIUM (GAUZE/BANDAGES/DRESSINGS) IMPLANT
BINDER BREAST XLRG (GAUZE/BANDAGES/DRESSINGS) IMPLANT
BINDER BREAST XXLRG (GAUZE/BANDAGES/DRESSINGS) IMPLANT
BLADE SURG 15 STRL LF DISP TIS (BLADE) ×1 IMPLANT
BLADE SURG 15 STRL SS (BLADE) ×1
CANISTER SUC SOCK COL 7IN (MISCELLANEOUS) IMPLANT
CANISTER SUCT 1200ML W/VALVE (MISCELLANEOUS) IMPLANT
CHLORAPREP W/TINT 26ML (MISCELLANEOUS) ×2 IMPLANT
CLIP TI WIDE RED SMALL 6 (CLIP) IMPLANT
COVER BACK TABLE 60X90IN (DRAPES) ×2 IMPLANT
COVER MAYO STAND STRL (DRAPES) ×2 IMPLANT
COVER PROBE W GEL 5X96 (DRAPES) ×2 IMPLANT
DECANTER SPIKE VIAL GLASS SM (MISCELLANEOUS) IMPLANT
DEVICE DUBIN W/COMP PLATE 8390 (MISCELLANEOUS) ×2 IMPLANT
DRAPE LAPAROSCOPIC ABDOMINAL (DRAPES) ×2 IMPLANT
DRAPE UTILITY XL STRL (DRAPES) ×2 IMPLANT
ELECT COATED BLADE 2.86 ST (ELECTRODE) ×2 IMPLANT
ELECT REM PT RETURN 9FT ADLT (ELECTROSURGICAL) ×2
ELECTRODE REM PT RTRN 9FT ADLT (ELECTROSURGICAL) ×1 IMPLANT
GLOVE BIOGEL PI IND STRL 8 (GLOVE) ×1 IMPLANT
GLOVE BIOGEL PI INDICATOR 8 (GLOVE) ×1
GLOVE ECLIPSE 7.5 STRL STRAW (GLOVE) ×2 IMPLANT
GOWN STRL REUS W/ TWL LRG LVL3 (GOWN DISPOSABLE) ×1 IMPLANT
GOWN STRL REUS W/ TWL XL LVL3 (GOWN DISPOSABLE) ×1 IMPLANT
GOWN STRL REUS W/TWL LRG LVL3 (GOWN DISPOSABLE) ×1
GOWN STRL REUS W/TWL XL LVL3 (GOWN DISPOSABLE) ×1
ILLUMINATOR WAVEGUIDE N/F (MISCELLANEOUS) ×2 IMPLANT
KIT MARKER MARGIN INK (KITS) ×2 IMPLANT
LIQUID BAND (GAUZE/BANDAGES/DRESSINGS) ×2 IMPLANT
NEEDLE HYPO 25X1 1.5 SAFETY (NEEDLE) ×2 IMPLANT
NS IRRIG 1000ML POUR BTL (IV SOLUTION) IMPLANT
PACK BASIN DAY SURGERY FS (CUSTOM PROCEDURE TRAY) ×2 IMPLANT
PENCIL BUTTON HOLSTER BLD 10FT (ELECTRODE) ×2 IMPLANT
SLEEVE SCD COMPRESS KNEE MED (MISCELLANEOUS) ×2 IMPLANT
SPONGE LAP 4X18 X RAY DECT (DISPOSABLE) ×2 IMPLANT
SUT MON AB 5-0 PS2 18 (SUTURE) ×2 IMPLANT
SUT VICRYL 3-0 CR8 SH (SUTURE) ×2 IMPLANT
SYR CONTROL 10ML LL (SYRINGE) ×2 IMPLANT
TOWEL OR 17X24 6PK STRL BLUE (TOWEL DISPOSABLE) ×2 IMPLANT
TOWEL OR NON WOVEN STRL DISP B (DISPOSABLE) ×2 IMPLANT
TUBE CONNECTING 20X1/4 (TUBING) IMPLANT
YANKAUER SUCT BULB TIP NO VENT (SUCTIONS) IMPLANT

## 2015-09-23 NOTE — Transfer of Care (Signed)
Immediate Anesthesia Transfer of Care Note  Patient: Cassandra Fox  Procedure(s) Performed: Procedure(s): RIGHT BREAST LUMPECTOMY WITH RADIOACTIVE SEED LOCALIZATION (Right)  Patient Location: PACU  Anesthesia Type:General  Level of Consciousness: awake, alert , oriented and patient cooperative  Airway & Oxygen Therapy: Patient Spontanous Breathing and Patient connected to face mask oxygen  Post-op Assessment: Report given to RN and Post -op Vital signs reviewed and stable  Post vital signs: Reviewed and stable  Last Vitals:  Vitals:   09/23/15 0636  BP: 137/67  Pulse: (!) 54  Resp: 18  Temp: 36.8 C    Last Pain:  Vitals:   09/23/15 0636  TempSrc: Oral      Patients Stated Pain Goal: 0 ([phone] [id number])  Complications: No apparent anesthesia complications

## 2015-09-23 NOTE — Discharge Instructions (Signed)
[facility] °Office Phone Number [phone] ° °BREAST BIOPSY/ PARTIAL MASTECTOMY: POST OP INSTRUCTIONS ° °Always review your discharge instruction sheet given to you by the facility where your surgery was performed. ° °IF YOU HAVE DISABILITY OR FAMILY LEAVE FORMS, YOU MUST BRING THEM TO THE OFFICE FOR PROCESSING.  DO NOT GIVE THEM TO YOUR DOCTOR. ° °1. A prescription for pain medication may be given to you upon discharge.  Take your pain medication as prescribed, if needed.  If narcotic pain medicine is not needed, then you may take acetaminophen (Tylenol) or ibuprofen (Advil) as needed. °2. Take your usually prescribed medications unless otherwise directed °3. If you need a refill on your pain medication, please contact your pharmacy.  They will contact our office to request authorization.  Prescriptions will not be filled after 5pm or on week-ends. °4. You should eat very light the first 24 hours after surgery, such as soup, crackers, pudding, etc.  Resume your normal diet the day after surgery. °5. Most patients will experience some swelling and bruising in the breast.  Ice packs and a good support bra will help.  Swelling and bruising can take several days to resolve.  °6. It is common to experience some constipation if taking pain medication after surgery.  Increasing fluid intake and taking a stool softener will usually help or prevent this problem from occurring.  A mild laxative (Milk of Magnesia or Miralax) should be taken according to package directions if there are no bowel movements after 48 hours. °7. Unless discharge instructions indicate otherwise, you may remove your bandages 24-48 hours after surgery, and you may shower at that time.  You may have steri-strips (small skin tapes) in place directly over the incision.  These strips should be left on the skin for 7-10 days.  If your surgeon used skin glue on the incision, you may shower in 24 hours.  The glue will flake off over the  next 2-3 weeks.  Any sutures or staples will be removed at the office during your follow-up visit. °8. ACTIVITIES:  You may resume regular daily activities (gradually increasing) beginning the next day.  Wearing a good support bra or sports bra minimizes pain and swelling.  You may have sexual intercourse when it is comfortable. °a. You may drive when you no longer are taking prescription pain medication, you can comfortably wear a seatbelt, and you can safely maneuver your car and apply brakes. °b. RETURN TO WORK:  ______________________________________________________________________________________ °9. You should see your doctor in the office for a follow-up appointment approximately two weeks after your surgery.  Your doctor’s nurse will typically make your follow-up appointment when she calls you with your pathology report.  Expect your pathology report 2-3 business days after your surgery.  You may call to check if you do not hear from us after three days. °10. OTHER INSTRUCTIONS: _______________________________________________________________________________________________ _____________________________________________________________________________________________________________________________________ °_____________________________________________________________________________________________________________________________________ °_____________________________________________________________________________________________________________________________________ ° °WHEN TO CALL YOUR DOCTOR: °1. Fever over 101.0 °2. Nausea and/or vomiting. °3. Extreme swelling or bruising. °4. Continued bleeding from incision. °5. Increased pain, redness, or drainage from the incision. ° °The clinic staff is available to answer your questions during regular business hours.  Please don’t hesitate to call and ask to speak to one of the nurses for clinical concerns.  If you have a medical emergency, go to the nearest  emergency room or call 911.  A surgeon from [facility] is always on call at the hospital. ° °For further questions, please visit [email or web address]  ° ° ° °  Post Anesthesia Home Care Instructions ° °Activity: °Get plenty of rest for the remainder of the day. A responsible adult should stay with you for 24 hours following the procedure.  °For the next 24 hours, DO NOT: °-Drive a car °-Operate machinery °-Drink alcoholic beverages °-Take any medication unless instructed by your physician °-Make any legal decisions or sign important papers. ° °Meals: °Start with liquid foods such as gelatin or soup. Progress to regular foods as tolerated. Avoid greasy, spicy, heavy foods. If nausea and/or vomiting occur, drink only clear liquids until the nausea and/or vomiting subsides. Call your physician if vomiting continues. ° °Special Instructions/Symptoms: °Your throat may feel dry or sore from the anesthesia or the breathing tube placed in your throat during surgery. If this causes discomfort, gargle with warm salt water. The discomfort should disappear within 24 hours. ° °If you had a scopolamine patch placed behind your ear for the management of post- operative nausea and/or vomiting: ° °1. The medication in the patch is effective for 72 hours, after which it should be removed.  Wrap patch in a tissue and discard in the trash. Wash hands thoroughly with soap and water. °2. You may remove the patch earlier than 72 hours if you experience unpleasant side effects which may include dry mouth, dizziness or visual disturbances. °3. Avoid touching the patch. Wash your hands with soap and water after contact with the patch. °  ° °

## 2015-09-23 NOTE — Anesthesia Preprocedure Evaluation (Signed)
Anesthesia Evaluation  Patient identified by MRN, date of birth, ID band Patient awake    Reviewed: Allergy & Precautions, NPO status , Patient's Chart, lab work & pertinent test results  Airway        Dental   Pulmonary Current Smoker,           Cardiovascular hypertension, Pt. on medications      Neuro/Psych PSYCHIATRIC DISORDERS Anxiety Depression    GI/Hepatic   Endo/Other  diabetes, Type 2, Oral Hypoglycemic AgentsHypothyroidism   Renal/GU      Musculoskeletal   Abdominal   Peds  Hematology   Anesthesia Other Findings   Reproductive/Obstetrics                             Anesthesia Physical Anesthesia Plan Anesthesia Quick Evaluation

## 2015-09-23 NOTE — Interval H&P Note (Signed)
History and Physical Interval Note:  09/23/2015 7:35 AM  Cassandra Fox  has presented today for surgery, with the diagnosis of complex sclerosing lesion right breast  The various methods of treatment have been discussed with the patient and family. After consideration of risks, benefits and other options for treatment, the patient has consented to  Procedure(s): RIGHT BREAST LUMPECTOMY WITH RADIOACTIVE SEED LOCALIZATION (Right) as a surgical intervention .  The patient's history has been reviewed, patient examined, no change in status, stable for surgery.  I have reviewed the patient's chart and labs.  Questions were answered to the patient's satisfaction.     Sevrin Sally T

## 2015-09-23 NOTE — Op Note (Signed)
Preoperative Diagnosis: complex sclerosing lesion right breast  Postoprative Diagnosis: complex sclerosing lesion right breast  Procedure: Procedure(s): RIGHT BREAST LUMPECTOMY WITH RADIOACTIVE SEED LOCALIZATION   Surgeon: Excell Seltzer T   Assistants: None  Anesthesia:  General LMA anesthesia  Indications: Patient is a 60 year old female with a recent abnormal screening mammogram and subsequently 3 more biopsies of the right breast. 2 showed benign fibrocystic change but the most posterior lateral biopsy revealed a complex sclerosing lesion. We discussed the diagnosis and options and she has elected to proceed with excisional biopsy. We discussed the nature of the procedure and indications and risks detailed elsewhere    Procedure Detail:  Patient was examined in the holding area and seed placement confirmed with the neoprobe. She was taken to the operating room, placed in the supine position on the operating table and laryngeal mask general anesthesia induced. She received preoperative IV antibiotics. PAS replace. The right breast was widely sterilely prepped and draped. Patient timeout was performed and correct procedure verified. The probe was used to identify the location of the seed in the lateral right breast. I made a lateral circumareolar incision and using the Invuity retractor a skin's subcutaneous flap was raised laterally over the seed conferred with the neoprobe. The area was well exposed and then I excised approximately 2.5 cm globular specimen of breast tissue around the seed and extending this a little more superior as the seed appeared to be slightly inferior to the marking clip. The specimen was excised with cautery and margins inked. Specimen x-ray showed the seed and biopsy clip within the specimen with the biopsy clip and area of distortion appearing centrally located. The soft tissue was infiltrated with Marcaine. Complete hemostasis was obtained. The lumpectomy cavity  was marked with clips. The breast and simultaneous tissue was closed with interrupted 3-0 Vicryl and the skin with running subcuticular 5-0 Monocryl and Liquiban. Sponge needle and instrument counts were correct.    Findings: As above  Estimated Blood Loss:  Minimal         Drains: None  Blood Given: none          Specimens: Right breast tissue        Complications:  * No complications entered in OR log *         Disposition: PACU - hemodynamically stable.         Condition: stable

## 2015-09-23 NOTE — Anesthesia Procedure Notes (Signed)
Procedure Name: LMA Insertion Date/Time: 09/23/2015 7:41 AM Performed by: Chayton Murata D Pre-anesthesia Checklist: Patient identified, Emergency Drugs available, Suction available and Patient being monitored Patient Re-evaluated:Patient Re-evaluated prior to inductionOxygen Delivery Method: Circle system utilized Preoxygenation: Pre-oxygenation with 100% oxygen Intubation Type: IV induction Ventilation: Mask ventilation without difficulty LMA: LMA inserted LMA Size: 4.0 Number of attempts: 1 Airway Equipment and Method: Bite block Placement Confirmation: positive ETCO2 Tube secured with: Tape Dental Injury: Teeth and Oropharynx as per pre-operative assessment

## 2015-09-23 NOTE — Anesthesia Preprocedure Evaluation (Addendum)
Anesthesia Evaluation  Patient identified by MRN, date of birth, ID band Patient awake    Reviewed: Allergy & Precautions, NPO status , Patient's Chart, lab work & pertinent test results  Airway Mallampati: II  TM Distance: >3 FB Neck ROM: Full    Dental   Pulmonary Current Smoker,    breath sounds clear to auscultation       Cardiovascular hypertension, Pt. on medications  Rhythm:Regular Rate:Normal     Neuro/Psych Anxiety Depression negative neurological ROS     GI/Hepatic negative GI ROS, Neg liver ROS,   Endo/Other  diabetes, Type 2, Oral Hypoglycemic AgentsHypothyroidism   Renal/GU negative Renal ROS     Musculoskeletal negative musculoskeletal ROS (+)   Abdominal   Peds  Hematology negative hematology ROS (+)   Anesthesia Other Findings   Reproductive/Obstetrics                            Anesthesia Physical Anesthesia Plan  ASA: II  Anesthesia Plan: General   Post-op Pain Management:    Induction: Intravenous  Airway Management Planned: LMA  Additional Equipment:   Intra-op Plan:   Post-operative Plan: Extubation in OR  Informed Consent: I have reviewed the patients History and Physical, chart, labs and discussed the procedure including the risks, benefits and alternatives for the proposed anesthesia with the patient or authorized representative who has indicated his/her understanding and acceptance.   Dental advisory given  Plan Discussed with: CRNA  Anesthesia Plan Comments:         Anesthesia Quick Evaluation

## 2015-09-23 NOTE — Anesthesia Postprocedure Evaluation (Signed)
Anesthesia Post Note  Patient: Cassandra Fox  Procedure(s) Performed: Procedure(s) (LRB): RIGHT BREAST LUMPECTOMY WITH RADIOACTIVE SEED LOCALIZATION (Right)  Patient location during evaluation: PACU Anesthesia Type: General Level of consciousness: sedated Pain management: pain level controlled Vital Signs Assessment: post-procedure vital signs reviewed and stable Respiratory status: spontaneous breathing and respiratory function stable Cardiovascular status: stable Anesthetic complications: no    Last Vitals:  Vitals:   09/23/15 0924 09/23/15 0927  BP:    Pulse: 79 74  Resp: 13 10  Temp:      Last Pain:  Vitals:   09/23/15 0915  TempSrc:   PainSc: [place]

## 2015-09-28 ENCOUNTER — Encounter (HOSPITAL_BASED_OUTPATIENT_CLINIC_OR_DEPARTMENT_OTHER): Payer: Self-pay | Admitting: General Surgery

## 2015-10-21 DIAGNOSIS — E039 Hypothyroidism, unspecified: Secondary | ICD-10-CM | POA: Diagnosis not present

## 2015-10-21 DIAGNOSIS — Z23 Encounter for immunization: Secondary | ICD-10-CM | POA: Diagnosis not present

## 2015-10-21 DIAGNOSIS — R7303 Prediabetes: Secondary | ICD-10-CM | POA: Diagnosis not present

## 2015-10-21 DIAGNOSIS — E785 Hyperlipidemia, unspecified: Secondary | ICD-10-CM | POA: Diagnosis not present

## 2015-10-21 DIAGNOSIS — I10 Essential (primary) hypertension: Secondary | ICD-10-CM | POA: Diagnosis not present

## 2015-12-28 DIAGNOSIS — R5081 Fever presenting with conditions classified elsewhere: Secondary | ICD-10-CM | POA: Diagnosis not present

## 2015-12-28 DIAGNOSIS — J060 Acute laryngopharyngitis: Secondary | ICD-10-CM | POA: Diagnosis not present

## 2015-12-30 DIAGNOSIS — Z01419 Encounter for gynecological examination (general) (routine) without abnormal findings: Secondary | ICD-10-CM | POA: Diagnosis not present

## 2016-03-07 DIAGNOSIS — J060 Acute laryngopharyngitis: Secondary | ICD-10-CM | POA: Diagnosis not present

## 2016-03-23 DIAGNOSIS — F5101 Primary insomnia: Secondary | ICD-10-CM | POA: Diagnosis not present

## 2016-03-23 DIAGNOSIS — I10 Essential (primary) hypertension: Secondary | ICD-10-CM | POA: Diagnosis not present

## 2016-03-23 DIAGNOSIS — E782 Mixed hyperlipidemia: Secondary | ICD-10-CM | POA: Diagnosis not present

## 2016-03-23 DIAGNOSIS — I1310 Hypertensive heart and chronic kidney disease without heart failure, with stage 1 through stage 4 chronic kidney disease, or unspecified chronic kidney disease: Secondary | ICD-10-CM | POA: Diagnosis not present

## 2016-03-23 DIAGNOSIS — E038 Other specified hypothyroidism: Secondary | ICD-10-CM | POA: Diagnosis not present

## 2016-03-23 DIAGNOSIS — R7303 Prediabetes: Secondary | ICD-10-CM | POA: Diagnosis not present

## 2016-05-04 DIAGNOSIS — L814 Other melanin hyperpigmentation: Secondary | ICD-10-CM | POA: Diagnosis not present

## 2016-05-04 DIAGNOSIS — L812 Freckles: Secondary | ICD-10-CM | POA: Diagnosis not present

## 2016-05-04 DIAGNOSIS — Z85828 Personal history of other malignant neoplasm of skin: Secondary | ICD-10-CM | POA: Diagnosis not present

## 2016-05-04 DIAGNOSIS — D1801 Hemangioma of skin and subcutaneous tissue: Secondary | ICD-10-CM | POA: Diagnosis not present

## 2016-05-08 DIAGNOSIS — R9431 Abnormal electrocardiogram [ECG] [EKG]: Secondary | ICD-10-CM | POA: Diagnosis not present

## 2016-05-08 DIAGNOSIS — I10 Essential (primary) hypertension: Secondary | ICD-10-CM | POA: Diagnosis not present

## 2016-05-10 DIAGNOSIS — I10 Essential (primary) hypertension: Secondary | ICD-10-CM | POA: Diagnosis not present

## 2016-05-10 DIAGNOSIS — R9431 Abnormal electrocardiogram [ECG] [EKG]: Secondary | ICD-10-CM | POA: Diagnosis not present

## 2016-06-13 DIAGNOSIS — J180 Bronchopneumonia, unspecified organism: Secondary | ICD-10-CM | POA: Diagnosis not present

## 2016-08-03 DIAGNOSIS — E038 Other specified hypothyroidism: Secondary | ICD-10-CM | POA: Diagnosis not present

## 2016-08-03 DIAGNOSIS — E782 Mixed hyperlipidemia: Secondary | ICD-10-CM | POA: Diagnosis not present

## 2016-08-03 DIAGNOSIS — I10 Essential (primary) hypertension: Secondary | ICD-10-CM | POA: Diagnosis not present

## 2016-08-03 DIAGNOSIS — R7301 Impaired fasting glucose: Secondary | ICD-10-CM | POA: Diagnosis not present

## 2016-08-03 DIAGNOSIS — R7303 Prediabetes: Secondary | ICD-10-CM | POA: Diagnosis not present

## 2016-08-31 DIAGNOSIS — H5203 Hypermetropia, bilateral: Secondary | ICD-10-CM | POA: Diagnosis not present

## 2016-08-31 DIAGNOSIS — H35362 Drusen (degenerative) of macula, left eye: Secondary | ICD-10-CM | POA: Diagnosis not present

## 2016-08-31 DIAGNOSIS — H40013 Open angle with borderline findings, low risk, bilateral: Secondary | ICD-10-CM | POA: Diagnosis not present

## 2016-09-06 DIAGNOSIS — M5432 Sciatica, left side: Secondary | ICD-10-CM | POA: Diagnosis not present

## 2016-09-26 DIAGNOSIS — M9903 Segmental and somatic dysfunction of lumbar region: Secondary | ICD-10-CM | POA: Diagnosis not present

## 2016-09-26 DIAGNOSIS — M545 Low back pain: Secondary | ICD-10-CM | POA: Diagnosis not present

## 2016-09-26 DIAGNOSIS — M9905 Segmental and somatic dysfunction of pelvic region: Secondary | ICD-10-CM | POA: Diagnosis not present

## 2016-09-26 DIAGNOSIS — M9902 Segmental and somatic dysfunction of thoracic region: Secondary | ICD-10-CM | POA: Diagnosis not present

## 2016-10-01 DIAGNOSIS — M9903 Segmental and somatic dysfunction of lumbar region: Secondary | ICD-10-CM | POA: Diagnosis not present

## 2016-10-01 DIAGNOSIS — M545 Low back pain: Secondary | ICD-10-CM | POA: Diagnosis not present

## 2016-10-01 DIAGNOSIS — M9902 Segmental and somatic dysfunction of thoracic region: Secondary | ICD-10-CM | POA: Diagnosis not present

## 2016-10-01 DIAGNOSIS — M9905 Segmental and somatic dysfunction of pelvic region: Secondary | ICD-10-CM | POA: Diagnosis not present

## 2016-10-04 DIAGNOSIS — M9903 Segmental and somatic dysfunction of lumbar region: Secondary | ICD-10-CM | POA: Diagnosis not present

## 2016-10-04 DIAGNOSIS — M9905 Segmental and somatic dysfunction of pelvic region: Secondary | ICD-10-CM | POA: Diagnosis not present

## 2016-10-04 DIAGNOSIS — M545 Low back pain: Secondary | ICD-10-CM | POA: Diagnosis not present

## 2016-10-04 DIAGNOSIS — M9902 Segmental and somatic dysfunction of thoracic region: Secondary | ICD-10-CM | POA: Diagnosis not present

## 2016-10-10 DIAGNOSIS — M9902 Segmental and somatic dysfunction of thoracic region: Secondary | ICD-10-CM | POA: Diagnosis not present

## 2016-10-10 DIAGNOSIS — M545 Low back pain: Secondary | ICD-10-CM | POA: Diagnosis not present

## 2016-10-10 DIAGNOSIS — M9903 Segmental and somatic dysfunction of lumbar region: Secondary | ICD-10-CM | POA: Diagnosis not present

## 2016-10-10 DIAGNOSIS — M9905 Segmental and somatic dysfunction of pelvic region: Secondary | ICD-10-CM | POA: Diagnosis not present

## 2016-10-15 DIAGNOSIS — M545 Low back pain: Secondary | ICD-10-CM | POA: Diagnosis not present

## 2016-10-15 DIAGNOSIS — M9905 Segmental and somatic dysfunction of pelvic region: Secondary | ICD-10-CM | POA: Diagnosis not present

## 2016-10-15 DIAGNOSIS — M9902 Segmental and somatic dysfunction of thoracic region: Secondary | ICD-10-CM | POA: Diagnosis not present

## 2016-10-15 DIAGNOSIS — M9903 Segmental and somatic dysfunction of lumbar region: Secondary | ICD-10-CM | POA: Diagnosis not present

## 2016-10-22 DIAGNOSIS — M9905 Segmental and somatic dysfunction of pelvic region: Secondary | ICD-10-CM | POA: Diagnosis not present

## 2016-10-22 DIAGNOSIS — M9903 Segmental and somatic dysfunction of lumbar region: Secondary | ICD-10-CM | POA: Diagnosis not present

## 2016-10-22 DIAGNOSIS — M9902 Segmental and somatic dysfunction of thoracic region: Secondary | ICD-10-CM | POA: Diagnosis not present

## 2016-10-22 DIAGNOSIS — M545 Low back pain: Secondary | ICD-10-CM | POA: Diagnosis not present

## 2016-11-14 DIAGNOSIS — Z23 Encounter for immunization: Secondary | ICD-10-CM | POA: Diagnosis not present

## 2016-11-22 ENCOUNTER — Other Ambulatory Visit: Payer: Self-pay | Admitting: Obstetrics & Gynecology

## 2016-11-22 ENCOUNTER — Other Ambulatory Visit: Payer: Self-pay | Admitting: General Surgery

## 2016-11-22 DIAGNOSIS — Z1231 Encounter for screening mammogram for malignant neoplasm of breast: Secondary | ICD-10-CM

## 2016-11-23 DIAGNOSIS — H40013 Open angle with borderline findings, low risk, bilateral: Secondary | ICD-10-CM | POA: Diagnosis not present

## 2016-11-23 DIAGNOSIS — H04202 Unspecified epiphora, left lacrimal gland: Secondary | ICD-10-CM | POA: Diagnosis not present

## 2016-11-23 DIAGNOSIS — H25041 Posterior subcapsular polar age-related cataract, right eye: Secondary | ICD-10-CM | POA: Diagnosis not present

## 2016-11-30 DIAGNOSIS — I10 Essential (primary) hypertension: Secondary | ICD-10-CM | POA: Diagnosis not present

## 2016-11-30 DIAGNOSIS — F330 Major depressive disorder, recurrent, mild: Secondary | ICD-10-CM | POA: Diagnosis not present

## 2016-11-30 DIAGNOSIS — R7303 Prediabetes: Secondary | ICD-10-CM | POA: Diagnosis not present

## 2016-11-30 DIAGNOSIS — E038 Other specified hypothyroidism: Secondary | ICD-10-CM | POA: Diagnosis not present

## 2016-11-30 DIAGNOSIS — E782 Mixed hyperlipidemia: Secondary | ICD-10-CM | POA: Diagnosis not present

## 2016-12-28 ENCOUNTER — Ambulatory Visit
Admission: RE | Admit: 2016-12-28 | Discharge: 2016-12-28 | Disposition: A | Discharge status: Home / Self Care | Payer: BLUE CROSS/BLUE SHIELD | Source: Ambulatory Visit | Attending: Obstetrics & Gynecology | Admitting: Obstetrics & Gynecology

## 2016-12-28 DIAGNOSIS — Z1231 Encounter for screening mammogram for malignant neoplasm of breast: Secondary | ICD-10-CM | POA: Diagnosis not present

## 2016-12-28 DIAGNOSIS — Z85828 Personal history of other malignant neoplasm of skin: Secondary | ICD-10-CM | POA: Diagnosis not present

## 2016-12-28 DIAGNOSIS — L820 Inflamed seborrheic keratosis: Secondary | ICD-10-CM | POA: Diagnosis not present

## 2016-12-28 DIAGNOSIS — L821 Other seborrheic keratosis: Secondary | ICD-10-CM | POA: Diagnosis not present

## 2016-12-28 DIAGNOSIS — L853 Xerosis cutis: Secondary | ICD-10-CM | POA: Diagnosis not present

## 2017-01-07 DIAGNOSIS — B9681 Helicobacter pylori [H. pylori] as the cause of diseases classified elsewhere: Secondary | ICD-10-CM | POA: Diagnosis not present

## 2017-01-31 ENCOUNTER — Other Ambulatory Visit: Payer: Self-pay | Admitting: Obstetrics & Gynecology

## 2017-02-02 ENCOUNTER — Other Ambulatory Visit: Payer: Self-pay | Admitting: Obstetrics & Gynecology

## 2017-02-02 DIAGNOSIS — R5381 Other malaise: Secondary | ICD-10-CM

## 2017-02-14 ENCOUNTER — Other Ambulatory Visit: Payer: Self-pay | Admitting: Obstetrics & Gynecology

## 2017-02-14 DIAGNOSIS — R5381 Other malaise: Secondary | ICD-10-CM

## 2017-02-16 IMAGING — MG 2D DIGITAL DIAGNOSTIC UNILATERAL RIGHT MAMMOGRAM WITH CAD AND AD
6 series · 6 of 14 positions shown · non-contrast
Comparison: Previous exam(s).

CLINICAL DATA: Status post stereotactic core biopsy right breast
asymmetry.

EXAM:
DIAGNOSTIC RIGHT MAMMOGRAM POST ULTRASOUND BIOPSY

[R CC synth-2D]
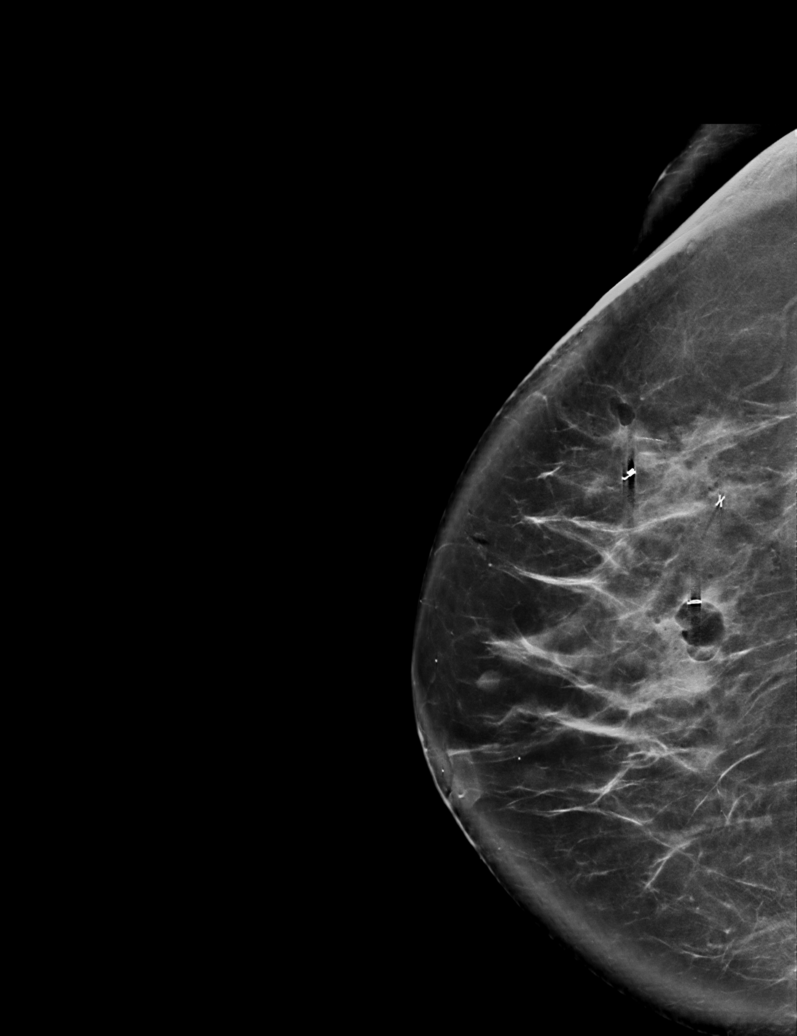

[R CC]
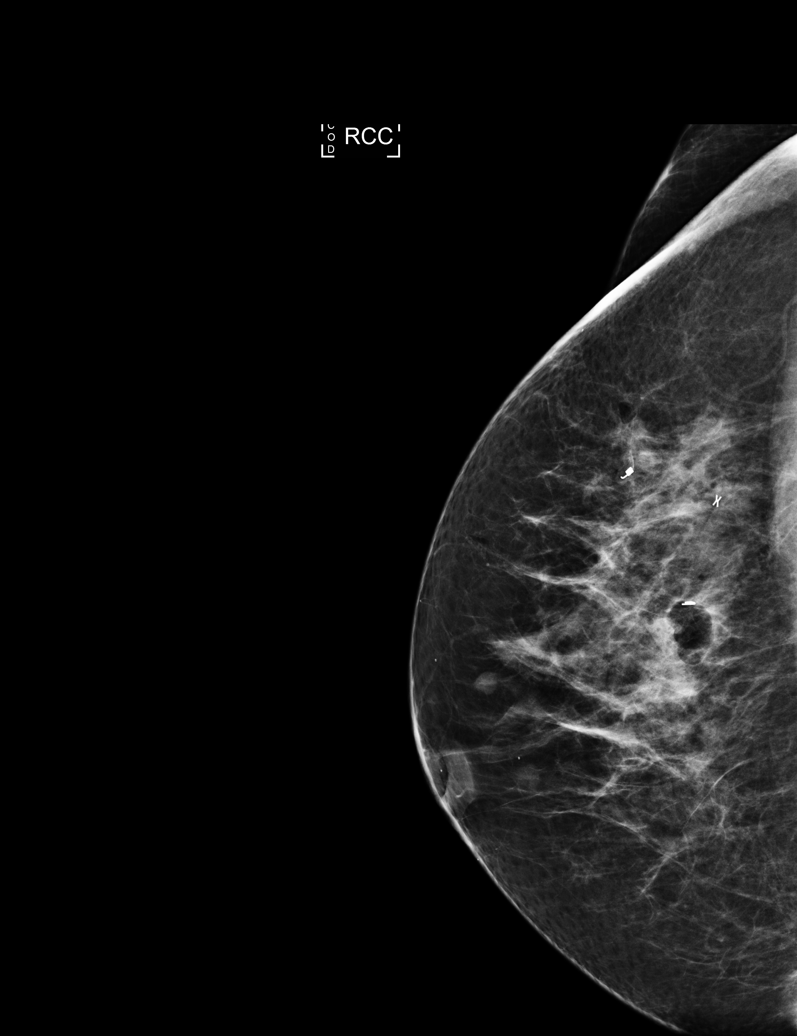

[R MLO]
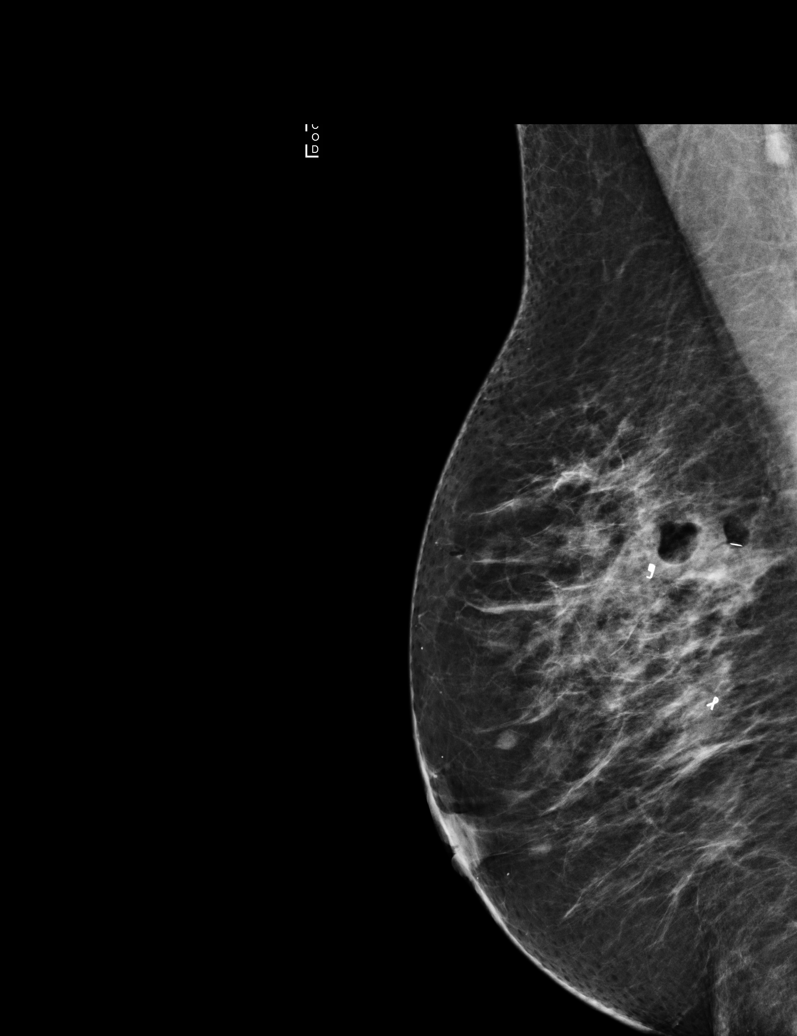

[R MLO synth-2D]
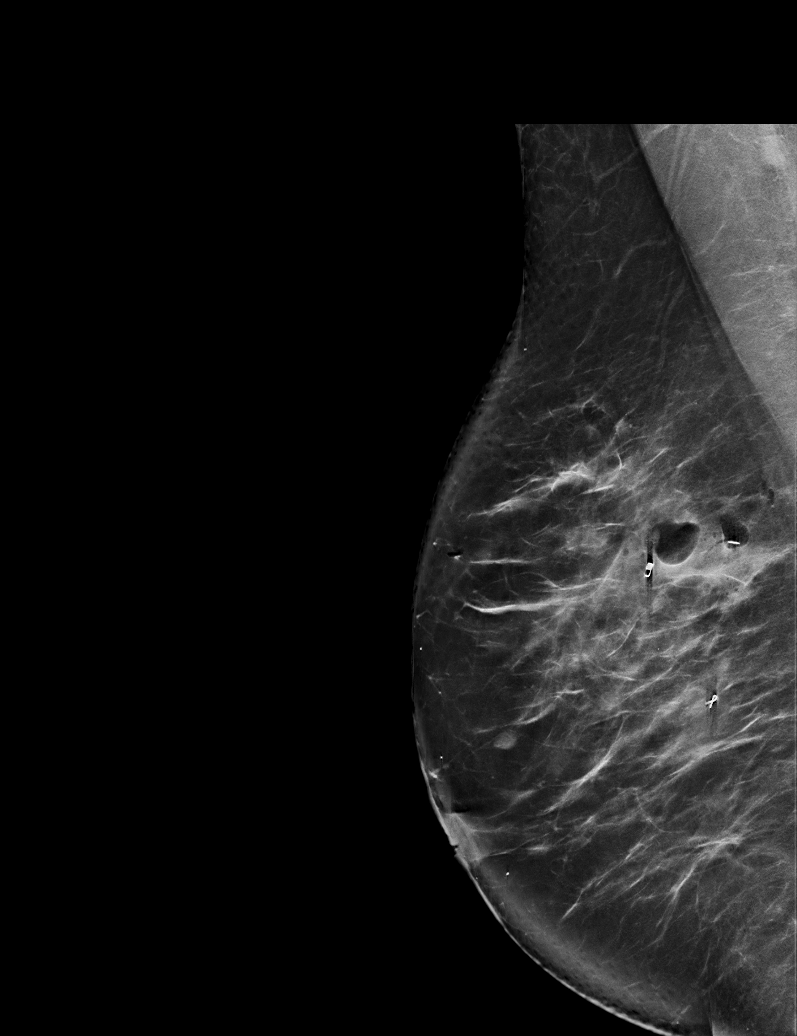

[R CC tomo · tomo slice 43/84.0]
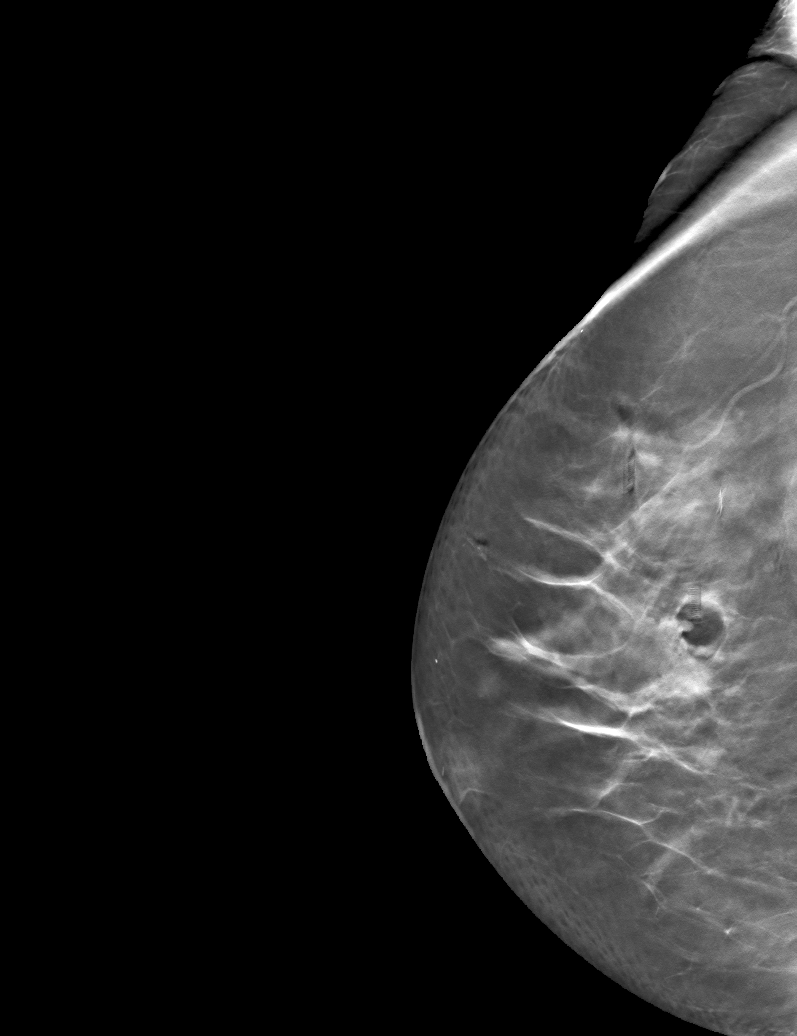

[R MLO tomo · tomo slice 43/84.0]
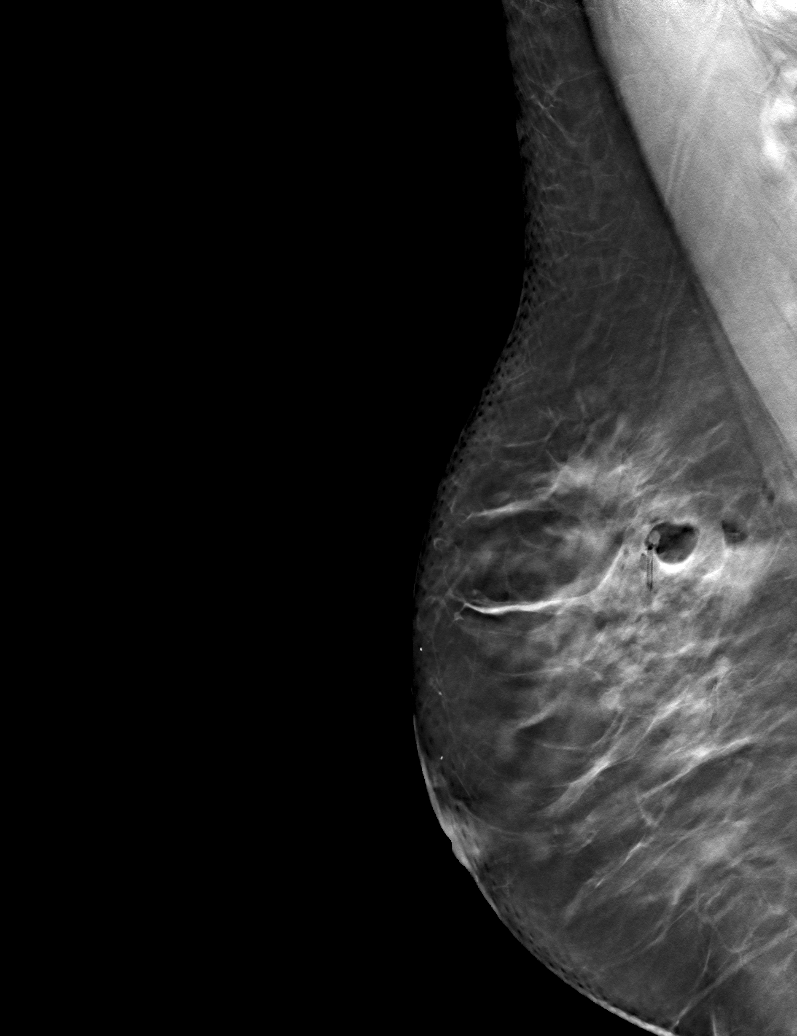

[6 of 14 positions shown; findings below may reference images not displayed]

FINDINGS: Mammographic images were obtained following right breast
stereotactic 3D tomographic guided biopsy of posterior upper right
breast asymmetry. Cc and MLO views of the right breast demonstrate X
shaped biopsy clip in the area of concern.
IMPRESSION: Post biopsy mammogram demonstrating biopsy clip in the area of
concern.

Final Assessment: Post Procedure Mammograms for Marker Placement

## 2017-02-20 ENCOUNTER — Other Ambulatory Visit: Payer: Self-pay | Admitting: Obstetrics & Gynecology

## 2017-02-20 DIAGNOSIS — E2839 Other primary ovarian failure: Secondary | ICD-10-CM

## 2017-05-10 DIAGNOSIS — I10 Essential (primary) hypertension: Secondary | ICD-10-CM | POA: Diagnosis not present

## 2017-05-10 DIAGNOSIS — F330 Major depressive disorder, recurrent, mild: Secondary | ICD-10-CM | POA: Diagnosis not present

## 2017-05-10 DIAGNOSIS — E038 Other specified hypothyroidism: Secondary | ICD-10-CM | POA: Diagnosis not present

## 2017-05-10 DIAGNOSIS — E782 Mixed hyperlipidemia: Secondary | ICD-10-CM | POA: Diagnosis not present

## 2017-05-10 DIAGNOSIS — R7309 Other abnormal glucose: Secondary | ICD-10-CM | POA: Diagnosis not present

## 2017-09-06 DIAGNOSIS — I10 Essential (primary) hypertension: Secondary | ICD-10-CM | POA: Diagnosis not present

## 2017-09-06 DIAGNOSIS — E038 Other specified hypothyroidism: Secondary | ICD-10-CM | POA: Diagnosis not present

## 2017-09-06 DIAGNOSIS — E1169 Type 2 diabetes mellitus with other specified complication: Secondary | ICD-10-CM | POA: Diagnosis not present

## 2017-09-06 DIAGNOSIS — E782 Mixed hyperlipidemia: Secondary | ICD-10-CM | POA: Diagnosis not present

## 2017-11-08 DIAGNOSIS — H25043 Posterior subcapsular polar age-related cataract, bilateral: Secondary | ICD-10-CM | POA: Diagnosis not present

## 2017-11-08 DIAGNOSIS — H3530 Unspecified macular degeneration: Secondary | ICD-10-CM | POA: Diagnosis not present

## 2017-11-08 DIAGNOSIS — H40013 Open angle with borderline findings, low risk, bilateral: Secondary | ICD-10-CM | POA: Diagnosis not present

## 2017-11-08 DIAGNOSIS — H5203 Hypermetropia, bilateral: Secondary | ICD-10-CM | POA: Diagnosis not present

## 2017-12-04 ENCOUNTER — Other Ambulatory Visit: Payer: Self-pay | Admitting: Obstetrics & Gynecology

## 2017-12-04 DIAGNOSIS — Z1231 Encounter for screening mammogram for malignant neoplasm of breast: Secondary | ICD-10-CM

## 2018-01-10 DIAGNOSIS — E038 Other specified hypothyroidism: Secondary | ICD-10-CM | POA: Diagnosis not present

## 2018-01-10 DIAGNOSIS — E782 Mixed hyperlipidemia: Secondary | ICD-10-CM | POA: Diagnosis not present

## 2018-01-10 DIAGNOSIS — I10 Essential (primary) hypertension: Secondary | ICD-10-CM | POA: Diagnosis not present

## 2018-01-10 DIAGNOSIS — E1169 Type 2 diabetes mellitus with other specified complication: Secondary | ICD-10-CM | POA: Diagnosis not present

## 2018-01-17 ENCOUNTER — Ambulatory Visit: Payer: BLUE CROSS/BLUE SHIELD

## 2018-02-21 ENCOUNTER — Ambulatory Visit
Admission: RE | Admit: 2018-02-21 | Discharge: 2018-02-21 | Disposition: A | Discharge status: Home / Self Care | Payer: BLUE CROSS/BLUE SHIELD | Source: Ambulatory Visit | Attending: Obstetrics & Gynecology | Admitting: Obstetrics & Gynecology

## 2018-02-21 DIAGNOSIS — Z1231 Encounter for screening mammogram for malignant neoplasm of breast: Secondary | ICD-10-CM | POA: Diagnosis not present

## 2018-07-18 DIAGNOSIS — Z1159 Encounter for screening for other viral diseases: Secondary | ICD-10-CM | POA: Diagnosis not present

## 2018-07-18 DIAGNOSIS — J301 Allergic rhinitis due to pollen: Secondary | ICD-10-CM | POA: Diagnosis not present

## 2018-07-18 DIAGNOSIS — H6502 Acute serous otitis media, left ear: Secondary | ICD-10-CM | POA: Diagnosis not present

## 2018-07-22 DIAGNOSIS — E782 Mixed hyperlipidemia: Secondary | ICD-10-CM | POA: Diagnosis not present

## 2018-07-22 DIAGNOSIS — E1169 Type 2 diabetes mellitus with other specified complication: Secondary | ICD-10-CM | POA: Diagnosis not present

## 2018-07-22 DIAGNOSIS — I10 Essential (primary) hypertension: Secondary | ICD-10-CM | POA: Diagnosis not present

## 2018-07-22 DIAGNOSIS — E038 Other specified hypothyroidism: Secondary | ICD-10-CM | POA: Diagnosis not present

## 2018-07-22 DIAGNOSIS — Z6826 Body mass index (BMI) 26.0-26.9, adult: Secondary | ICD-10-CM | POA: Diagnosis not present

## 2018-07-22 DIAGNOSIS — Z1159 Encounter for screening for other viral diseases: Secondary | ICD-10-CM | POA: Diagnosis not present

## 2018-07-24 DIAGNOSIS — E1169 Type 2 diabetes mellitus with other specified complication: Secondary | ICD-10-CM | POA: Diagnosis not present

## 2018-11-10 DIAGNOSIS — Z23 Encounter for immunization: Secondary | ICD-10-CM | POA: Diagnosis not present

## 2018-11-21 DIAGNOSIS — E038 Other specified hypothyroidism: Secondary | ICD-10-CM | POA: Diagnosis not present

## 2018-11-21 DIAGNOSIS — Z1159 Encounter for screening for other viral diseases: Secondary | ICD-10-CM | POA: Diagnosis not present

## 2018-11-21 DIAGNOSIS — E1169 Type 2 diabetes mellitus with other specified complication: Secondary | ICD-10-CM | POA: Diagnosis not present

## 2018-11-21 DIAGNOSIS — I10 Essential (primary) hypertension: Secondary | ICD-10-CM | POA: Diagnosis not present

## 2018-11-21 DIAGNOSIS — E782 Mixed hyperlipidemia: Secondary | ICD-10-CM | POA: Diagnosis not present

## 2018-11-21 DIAGNOSIS — Z6826 Body mass index (BMI) 26.0-26.9, adult: Secondary | ICD-10-CM | POA: Diagnosis not present

## 2019-01-06 DIAGNOSIS — E038 Other specified hypothyroidism: Secondary | ICD-10-CM | POA: Diagnosis not present

## 2019-02-05 ENCOUNTER — Other Ambulatory Visit: Payer: Self-pay | Admitting: Nurse Practitioner

## 2019-02-05 ENCOUNTER — Other Ambulatory Visit: Payer: Self-pay | Admitting: Obstetrics & Gynecology

## 2019-02-05 DIAGNOSIS — E2839 Other primary ovarian failure: Secondary | ICD-10-CM

## 2019-02-05 DIAGNOSIS — Z1231 Encounter for screening mammogram for malignant neoplasm of breast: Secondary | ICD-10-CM

## 2019-02-18 DIAGNOSIS — Z01419 Encounter for gynecological examination (general) (routine) without abnormal findings: Secondary | ICD-10-CM | POA: Diagnosis not present

## 2019-03-18 DIAGNOSIS — E1159 Type 2 diabetes mellitus with other circulatory complications: Secondary | ICD-10-CM | POA: Insufficient documentation

## 2019-03-18 DIAGNOSIS — E669 Obesity, unspecified: Secondary | ICD-10-CM | POA: Insufficient documentation

## 2019-03-18 DIAGNOSIS — F330 Major depressive disorder, recurrent, mild: Secondary | ICD-10-CM | POA: Insufficient documentation

## 2019-03-18 DIAGNOSIS — I10 Essential (primary) hypertension: Secondary | ICD-10-CM | POA: Insufficient documentation

## 2019-03-18 DIAGNOSIS — E039 Hypothyroidism, unspecified: Secondary | ICD-10-CM

## 2019-03-18 DIAGNOSIS — E1169 Type 2 diabetes mellitus with other specified complication: Secondary | ICD-10-CM

## 2019-03-18 DIAGNOSIS — E782 Mixed hyperlipidemia: Secondary | ICD-10-CM

## 2019-03-18 DIAGNOSIS — I152 Hypertension secondary to endocrine disorders: Secondary | ICD-10-CM | POA: Insufficient documentation

## 2019-03-18 HISTORY — DX: Mixed hyperlipidemia: E78.2

## 2019-03-18 HISTORY — DX: Type 2 diabetes mellitus with other specified complication: E11.69

## 2019-03-18 HISTORY — DX: Essential (primary) hypertension: I10

## 2019-03-18 HISTORY — DX: Major depressive disorder, recurrent, mild: F33.0

## 2019-03-18 HISTORY — DX: Hypothyroidism, unspecified: E03.9

## 2019-03-19 ENCOUNTER — Other Ambulatory Visit: Payer: Self-pay

## 2019-03-19 ENCOUNTER — Encounter: Payer: Self-pay | Admitting: Legal Medicine

## 2019-03-19 ENCOUNTER — Ambulatory Visit (INDEPENDENT_AMBULATORY_CARE_PROVIDER_SITE_OTHER): Payer: PPO | Admitting: Legal Medicine

## 2019-03-19 DIAGNOSIS — E669 Obesity, unspecified: Secondary | ICD-10-CM | POA: Diagnosis not present

## 2019-03-19 DIAGNOSIS — E782 Mixed hyperlipidemia: Secondary | ICD-10-CM

## 2019-03-19 DIAGNOSIS — I152 Hypertension secondary to endocrine disorders: Secondary | ICD-10-CM

## 2019-03-19 DIAGNOSIS — E1169 Type 2 diabetes mellitus with other specified complication: Secondary | ICD-10-CM | POA: Diagnosis not present

## 2019-03-19 DIAGNOSIS — I10 Essential (primary) hypertension: Secondary | ICD-10-CM | POA: Diagnosis not present

## 2019-03-19 DIAGNOSIS — F330 Major depressive disorder, recurrent, mild: Secondary | ICD-10-CM | POA: Diagnosis not present

## 2019-03-19 DIAGNOSIS — E039 Hypothyroidism, unspecified: Secondary | ICD-10-CM | POA: Diagnosis not present

## 2019-03-19 DIAGNOSIS — E1159 Type 2 diabetes mellitus with other circulatory complications: Secondary | ICD-10-CM | POA: Diagnosis not present

## 2019-03-19 LAB — POCT UA - MICROALBUMIN: Microalbumin Ur, POC: 30 mg/L

## 2019-03-19 NOTE — Assessment & Plan Note (Signed)
AN INDIVIDUAL CARE PLAN was established and reinforced today.  The patient's status was assessed using clinical findings on exam, labs, and other diagnostic testing. Patient's success at meeting treatment goals based on disease specific evidence-bassed guidelines and found to be in good control. RECOMMENDATIONS include maintaining present medicines and treatment. 

## 2019-03-19 NOTE — Progress Notes (Signed)
Established Patient Office Visit  Subjective:  Patient ID: Cassandra Fox, female    DOB: 08-Oct-1955  Age: 63 y.o. MRN: [id number]  CC:  Chief Complaint  Patient presents with  . Hyperlipidemia  . Hypothyroidism  . Diabetes Mellitus  . Hypertension    HPI Cassandra Fox presents for chronic visit.  Patient presents for follow up of hypertension.  Patient tolerating losartan/hctz well with side effects.  Patient was diagnosed with hypertension 2000 so has been treated for hypertension for 20 years.Patient is working on maintaining diet and exercise regimen and follows up as directed. Complication include none.  Patient present with type 2 diabetes.  Specifically, this is type 2, nonisulin requiring diabetes, complicated by hypertension and hypercholesterolemia.  Compliance with treatment has been good; patient take medicines as directed, maintains diet and exercise regimen, follows up as directed, and is keeping glucose diary.  Date of  diagnosis 2010.  Depression screen has been performed.Tobacco screen nonsmoker. Current medicines for diabetes losartan.  Patient is on losartan for renal protection and simvastatin for cholesterol control.  Patient performs foot exams daily and last ophthalmologic exam was one year ago.  Patient presents with hyperlipidemia.  Compliance with treatment has been good; patient takes medicines as directed, maintains low cholesterol diet, follows up as directed, and maintains exercise regimen.  Patient is using simvastatin without problems.  Patient has hypothroidism and is on levothyroxine 24mcg  Past Medical History:  Diagnosis Date  . Anxiety   . Breast mass, right   . Depression   . Diabetes mellitus without complication ([place])   . Hyperlipidemia   . Hypertension   . Hypothyroidism     Past Surgical History:  Procedure Laterality Date  . BREAST EXCISIONAL BIOPSY Right    benign  . BREAST LUMPECTOMY WITH RADIOACTIVE SEED LOCALIZATION Right  09/23/2015   Procedure: RIGHT BREAST LUMPECTOMY WITH RADIOACTIVE SEED LOCALIZATION;  Surgeon: Excell Seltzer, MD;  Location: [facility];  Service: General;  Laterality: Right;    Family History  Problem Relation Age of Onset  . Hyperlipidemia Mother   . Diabetes Mother   . Hyperlipidemia Father   . Hypertension Father     Social History   Socioeconomic History  . Marital status: Married    Spouse name: Not on file  . Number of children: 3  . Years of education: Not on file  . Highest education level: Not on file  Occupational History  . Not on file  Tobacco Use  . Smoking status: Current Every Day Smoker    Packs/day: 0.50    Types: Cigarettes  . Smokeless tobacco: Never Used  Substance and Sexual Activity  . Alcohol use: Yes    Comment: social  . Drug use: No  . Sexual activity: Not on file  Other Topics Concern  . Not on file  Social History Narrative  . Not on file   Social Determinants of Health   Financial Resource Strain:   . Difficulty of Paying Living Expenses: Not on file  Food Insecurity:   . Worried About Charity fundraiser in the Last Year: Not on file  . Ran Out of Food in the Last Year: Not on file  Transportation Needs:   . Lack of Transportation (Medical): Not on file  . Lack of Transportation (Non-Medical): Not on file  Physical Activity:   . Days of Exercise per Week: Not on file  . Minutes of Exercise per Session: Not on file  Stress:   .  Feeling of Stress : Not on file  Social Connections:   . Frequency of Communication with Friends and Family: Not on file  . Frequency of Social Gatherings with Friends and Family: Not on file  . Attends Religious Services: Not on file  . Active Member of Clubs or Organizations: Not on file  . Attends Archivist Meetings: Not on file  . Marital Status: Not on file  Intimate Partner Violence:   . Fear of Current or Ex-Partner: Not on file  . Emotionally Abused: Not on file  .  Physically Abused: Not on file  . Sexually Abused: Not on file    Outpatient Medications Prior to Visit  Medication Sig Dispense Refill  . ALPRAZolam (XANAX) 0.5 MG tablet Take 0.5 mg by mouth at bedtime as needed for anxiety.    Marland Kitchen aspirin EC 81 MG tablet Take 81 mg by mouth daily.    . citalopram (CELEXA) 20 MG tablet Take 20 mg by mouth daily.    Marland Kitchen levothyroxine (SYNTHROID) 75 MCG tablet Take 75 mcg by mouth daily before breakfast.     . losartan-hydrochlorothiazide (HYZAAR) 50-12.5 MG tablet Take 1 tablet by mouth daily.    . metFORMIN (GLUCOPHAGE) 500 MG tablet Take 500 mg by mouth 2 (two) times daily with a meal.     . ONETOUCH VERIO test strip     . simvastatin (ZOCOR) 20 MG tablet Take 20 mg by mouth daily.    . Vitamin D, Cholecalciferol, 1000 units CAPS Take by mouth.    . estradiol (VIVELLE-DOT) 0.075 MG/24HR Place 1 patch onto the skin 2 (two) times a week.    . hydrochlorothiazide (HYDRODIURIL) 25 MG tablet     . HYDROcodone-acetaminophen (NORCO/VICODIN) 5-325 MG tablet Take 1-2 tablets by mouth every 4 (four) hours as needed for moderate pain or severe pain. 10 tablet 0  . losartan (COZAAR) 100 MG tablet     . Omega-3 Fatty Acids (FISH OIL) 1000 MG CPDR Take by mouth.    Marland Kitchen PREVNAR 13 SUSP injection     . progesterone (PROMETRIUM) 200 MG capsule Take 200 mg by mouth daily.     No facility-administered medications prior to visit.    Allergies  Allergen Reactions  . Codeine Nausea Only    ROS Review of Systems  Constitutional: Negative.   HENT: Negative.   Eyes: Negative.   Respiratory: Negative.   Cardiovascular: Negative.   Gastrointestinal: Negative.   Endocrine: Negative.   Genitourinary: Negative.   Musculoskeletal: Negative.   Neurological: Negative.       Objective:    Physical Exam  Constitutional: Cassandra Fox is oriented to person, place, and time. Cassandra Fox appears well-developed and well-nourished.  HENT:  Head: Normocephalic and atraumatic.  Eyes:  Conjunctivae and EOM are normal.  Cardiovascular: Normal rate, regular rhythm and normal heart sounds.  Pulmonary/Chest: Effort normal and breath sounds normal.  Abdominal: Soft. Bowel sounds are normal.  Musculoskeletal:        General: Normal range of motion.     Cervical back: Normal range of motion and neck supple.  Neurological: Cassandra Fox is alert and oriented to person, place, and time.  Skin: Skin is warm and dry.  Psychiatric: Cassandra Fox has a normal mood and affect.  Vitals reviewed.   BP 130/70   Pulse 67   Temp 97.6 F (36.4 C)   Ht 5\' 5"  (1.651 m)   Wt 160 lb 12.8 oz (72.9 kg)   SpO2 97%   BMI 26.76 kg/m  Wt Readings from Last 3 Encounters:  03/19/19 160 lb 12.8 oz (72.9 kg)  09/23/15 160 lb (72.6 kg)     Health Maintenance Due  Topic Date Due  . HEMOGLOBIN A1C  May 25, 1955  . Hepatitis C Screening  03-20-1955  . OPHTHALMOLOGY EXAM  05/04/1963  . HIV Screening  05/03/1968  . TETANUS/TDAP  05/03/1972  . COLONOSCOPY  05/04/2003  . PAP SMEAR-Modifier  12/30/2017  . DEXA SCAN  05/04/2018  . PNA vac Low Risk Adult (1 of 2 - PCV13) 05/04/2018    There are no preventive care reminders to display for this patient.  No results found for: TSH No results found for: WBC, HGB, HCT, MCV, PLT Lab Results  Component Value Date   NA 134 (L) 09/22/2015   K 4.1 09/22/2015   CO2 24 09/22/2015   GLUCOSE 104 (H) 09/22/2015   BUN 12 09/22/2015   CREATININE 0.85 09/22/2015   CALCIUM 9.4 09/22/2015   ANIONGAP 9 09/22/2015   No results found for: CHOL No results found for: HDL No results found for: LDLCALC No results found for: TRIG No results found for: CHOLHDL No results found for: HGBA1C    Assessment & Plan:   Problem List Items Addressed This Visit      Cardiovascular and Mediastinum   Obesity, diabetes, and hypertension syndrome ([place])    An individual care plan was established and reinforced today.  The patient's status was assessed using clinical findings on exam,  labs and diagnostic testing. Patient success at meeting goals based on disease specific evidence-based guidelines and found to be good controlled. Medications were assessed and patient's understanding of the medical issues , including barriers were assessed. Recommend adherence to a diabetic diet, a graduated exercise program, HgbA1c level is checked quarterly, and urine microalbumin performed yearly .  Annual mono-filament sensation testing performed. Lower blood pressure and control hyperlipidemia is important. Get annual eye exams and annual flu shots and smoking cessation discussed.  Self management goals were discussed.      Relevant Orders   Hemoglobin A1c   Benign hypertension    An individual care plan was established and reinforced today.  The patient's status was assessed using clinical findings on exam and labs or diagnostic tests. The patient's success at meeting treatment goals on disease specific evidence-based guidelines and found to be well controlled. SELF MANAGEMENT: The patient and I together assessed ways to personally work towards obtaining the recommended goals. RECOMMENDATIONS: avoid decongestants found in common cold remedies, decrease consumption of alcohol, perform routine monitoring of BP with home BP cuff, exercise, reduction of dietary salt, take medicines as prescribed, try not to miss doses and quit smoking.  Regular exercise and maintaining a healthy weight is needed.  Stress reduction may help. A CLINICAL SUMMARY including written plan identify barriers to care unique to individual due to social or financial issues.  We attempt to mutually creat solutions for individual and family understanding.      Relevant Orders   POCT UA - Microalbumin (Completed)   CBC with Differential   Comprehensive metabolic panel     Endocrine   Hypothyroidism, adult    AN INDIVIDUAL CARE PLAN was established and reinforced today.  The patient's status was assessed using clinical  findings on exam, labs, and other diagnostic testing. Patient's success at meeting treatment goals based on disease specific evidence-bassed guidelines and found to be in good control. RECOMMENDATIONS include maintaining present medicines and treatment.      Relevant Orders  TSH     Other   Mixed hyperlipidemia    AN INDIVIDUAL CARE PLAN was established and reinforced today.  The patient's status was assessed using clinical findings on exam, lab and other diagnostic tests. The patient's disease status was assessed based on evidence-based guidelines and found to be well controlled. MEDICATIONS were reviewed. SELF MANAGEMENT GOALS have been discussed and patient's success at attaining the goal of low cholesterol was assessed. RECOMMENDATION given include regular exercise 3 days a week and low cholesterol/low fat diet. CLINICAL SUMMARY including written plan to identify barriers unique to the patient due to social or economic  reasons was discussed.      Relevant Orders   Lipid Panel   Major depressive disorder, recurrent episode, mild ([place])    Patient's depression is controlled with citalopram.   Anhedonia better.  PHQ 9 was not performed,  score na. An individual care plan was established or reinforced today.  The patient's disease status was assessed using clinical findings on exam, labs, and or other diagnostic testing to determine patient's success in meeting treatment goals based on disease specific evidence-based guidelines and found to be improving Recommendations include stay on medicines         No orders of the defined types were placed in this encounter.   Follow-up: Return in about 4 months (around 07/17/2019) for fasting.    Reinaldo Meeker, MD

## 2019-03-19 NOTE — Assessment & Plan Note (Signed)

## 2019-03-19 NOTE — Assessment & Plan Note (Signed)

## 2019-03-19 NOTE — Assessment & Plan Note (Signed)
Patient's depression is controlled with citalopram.   Anhedonia better.  PHQ 9 was not performed,  score na. An individual care plan was established or reinforced today.  The patient's disease status was assessed using clinical findings on exam, labs, and or other diagnostic testing to determine patient's success in meeting treatment goals based on disease specific evidence-based guidelines and found to be improving Recommendations include stay on medicines

## 2019-03-19 NOTE — Patient Instructions (Signed)

## 2019-03-19 NOTE — Assessment & Plan Note (Signed)

## 2019-03-20 LAB — COMPREHENSIVE METABOLIC PANEL
ALT: 14 IU/L (ref 0–32)
AST: 15 IU/L (ref 0–40)
Albumin/Globulin Ratio: 1.5 (ref 1.2–2.2)
Albumin: 4.3 g/dL (ref 3.8–4.8)
Alkaline Phosphatase: 97 IU/L (ref 39–117)
BUN/Creatinine Ratio: 12 (ref 12–28)
BUN: 10 mg/dL (ref 8–27)
Bilirubin Total: 0.4 mg/dL (ref 0.0–1.2)
CO2: 24 mmol/L (ref 20–29)
Calcium: 9.5 mg/dL (ref 8.7–10.3)
Chloride: 96 mmol/L (ref 96–106)
Creatinine, Ser: 0.82 mg/dL (ref 0.57–1.00)
GFR calc Af Amer: 87 mL/min/{1.73_m2} (ref >59)
GFR calc non Af Amer: 75 mL/min/{1.73_m2} (ref >59)
Globulin, Total: 2.8 g/dL (ref 1.5–4.5)
Glucose: 93 mg/dL (ref 65–99)
Potassium: 4.1 mmol/L (ref 3.5–5.2)
Sodium: 135 mmol/L (ref 134–144)
Total Protein: 7.1 g/dL (ref 6.0–8.5)

## 2019-03-20 LAB — CBC WITH DIFFERENTIAL/PLATELET
Basophils Absolute: 0.2 10*3/uL (ref 0.0–0.2)
Basos: 1 %
EOS (ABSOLUTE): 0.6 10*3/uL — ABNORMAL HIGH (ref 0.0–0.4)
Eos: 5 %
Hematocrit: 39 % (ref 34.0–46.6)
Hemoglobin: 13.1 g/dL (ref 11.1–15.9)
Immature Grans (Abs): 0.1 10*3/uL (ref 0.0–0.1)
Immature Granulocytes: 1 %
Lymphocytes Absolute: 2.8 10*3/uL (ref 0.7–3.1)
Lymphs: 22 %
MCH: 29.7 pg (ref 26.6–33.0)
MCHC: 33.6 g/dL (ref 31.5–35.7)
MCV: 88 fL (ref 79–97)
Monocytes Absolute: 1.2 10*3/uL — ABNORMAL HIGH (ref 0.1–0.9)
Monocytes: 10 %
Neutrophils Absolute: 7.8 10*3/uL — ABNORMAL HIGH (ref 1.4–7.0)
Neutrophils: 61 %
Platelets: 269 10*3/uL (ref 150–450)
RBC: 4.41 x10E6/uL (ref 3.77–5.28)
RDW: 12.7 % (ref 11.7–15.4)
WBC: 12.6 10*3/uL — ABNORMAL HIGH (ref 3.4–10.8)

## 2019-03-20 LAB — HEMOGLOBIN A1C
Est. average glucose Bld gHb Est-mCnc: 128 mg/dL
Hgb A1c MFr Bld: 6.1 % — ABNORMAL HIGH (ref 4.8–5.6)

## 2019-03-20 LAB — LIPID PANEL
Chol/HDL Ratio: 3.5 ratio (ref 0.0–4.4)
Cholesterol, Total: 142 mg/dL (ref 100–199)
HDL: 41 mg/dL (ref >39)
LDL Chol Calc (NIH): 69 mg/dL (ref 0–99)
Triglycerides: 195 mg/dL — ABNORMAL HIGH (ref 0–149)
VLDL Cholesterol Cal: 32 mg/dL (ref 5–40)

## 2019-03-20 LAB — TSH: TSH: 1.79 u[IU]/mL (ref 0.450–4.500)

## 2019-03-20 LAB — CARDIOVASCULAR RISK ASSESSMENT

## 2019-03-20 NOTE — Progress Notes (Signed)
Wbc 12.6 is she sick, no anemia, kidney and liver tests OK, triglycerides high- watch sugars and fats in diet, A1c 6.1 good lp

## 2019-04-01 DIAGNOSIS — H40053 Ocular hypertension, bilateral: Secondary | ICD-10-CM | POA: Diagnosis not present

## 2019-04-01 DIAGNOSIS — H25043 Posterior subcapsular polar age-related cataract, bilateral: Secondary | ICD-10-CM | POA: Diagnosis not present

## 2019-04-01 DIAGNOSIS — H5203 Hypermetropia, bilateral: Secondary | ICD-10-CM | POA: Diagnosis not present

## 2019-04-01 DIAGNOSIS — H2513 Age-related nuclear cataract, bilateral: Secondary | ICD-10-CM | POA: Diagnosis not present

## 2019-04-01 DIAGNOSIS — H524 Presbyopia: Secondary | ICD-10-CM | POA: Diagnosis not present

## 2019-04-01 DIAGNOSIS — H52223 Regular astigmatism, bilateral: Secondary | ICD-10-CM | POA: Diagnosis not present

## 2019-04-02 DIAGNOSIS — L814 Other melanin hyperpigmentation: Secondary | ICD-10-CM | POA: Diagnosis not present

## 2019-04-02 DIAGNOSIS — L821 Other seborrheic keratosis: Secondary | ICD-10-CM | POA: Diagnosis not present

## 2019-04-02 DIAGNOSIS — D1801 Hemangioma of skin and subcutaneous tissue: Secondary | ICD-10-CM | POA: Diagnosis not present

## 2019-04-02 DIAGNOSIS — Z85828 Personal history of other malignant neoplasm of skin: Secondary | ICD-10-CM | POA: Diagnosis not present

## 2019-04-02 DIAGNOSIS — D2272 Melanocytic nevi of left lower limb, including hip: Secondary | ICD-10-CM | POA: Diagnosis not present

## 2019-04-02 DIAGNOSIS — D225 Melanocytic nevi of trunk: Secondary | ICD-10-CM | POA: Diagnosis not present

## 2019-04-02 DIAGNOSIS — L570 Actinic keratosis: Secondary | ICD-10-CM | POA: Diagnosis not present

## 2019-04-06 ENCOUNTER — Other Ambulatory Visit: Payer: Self-pay | Admitting: Legal Medicine

## 2019-04-23 ENCOUNTER — Ambulatory Visit: Payer: PPO

## 2019-04-23 ENCOUNTER — Other Ambulatory Visit: Payer: PPO

## 2019-04-24 ENCOUNTER — Other Ambulatory Visit: Payer: Self-pay

## 2019-04-24 MED ORDER — LOSARTAN POTASSIUM-HCTZ 100-25 MG PO TABS: 1.0000 | ORAL_TABLET | Freq: Every day | ORAL | 2 refills | Status: DC

## 2019-05-29 ENCOUNTER — Ambulatory Visit
Admission: RE | Admit: 2019-05-29 | Discharge: 2019-05-29 | Disposition: A | Discharge status: Home / Self Care | Payer: PPO | Source: Ambulatory Visit | Attending: Obstetrics & Gynecology | Admitting: Obstetrics & Gynecology

## 2019-05-29 ENCOUNTER — Other Ambulatory Visit: Payer: Self-pay

## 2019-05-29 DIAGNOSIS — E2839 Other primary ovarian failure: Secondary | ICD-10-CM

## 2019-05-29 DIAGNOSIS — Z1231 Encounter for screening mammogram for malignant neoplasm of breast: Secondary | ICD-10-CM | POA: Diagnosis not present

## 2019-05-29 DIAGNOSIS — Z780 Asymptomatic menopausal state: Secondary | ICD-10-CM | POA: Diagnosis not present

## 2019-05-29 DIAGNOSIS — M85851 Other specified disorders of bone density and structure, right thigh: Secondary | ICD-10-CM | POA: Diagnosis not present

## 2019-06-14 ENCOUNTER — Other Ambulatory Visit: Payer: Self-pay | Admitting: Legal Medicine

## 2019-06-24 ENCOUNTER — Other Ambulatory Visit: Payer: Self-pay

## 2019-06-24 MED ORDER — CITALOPRAM HYDROBROMIDE 20 MG PO TABS: 20.0000 mg | ORAL_TABLET | Freq: Every day | ORAL | 2 refills | Status: DC

## 2019-07-06 ENCOUNTER — Other Ambulatory Visit: Payer: Self-pay | Admitting: Legal Medicine

## 2019-07-17 ENCOUNTER — Ambulatory Visit (INDEPENDENT_AMBULATORY_CARE_PROVIDER_SITE_OTHER): Payer: PPO | Admitting: Legal Medicine

## 2019-07-17 ENCOUNTER — Encounter: Payer: Self-pay | Admitting: Legal Medicine

## 2019-07-17 ENCOUNTER — Other Ambulatory Visit: Payer: Self-pay

## 2019-07-17 VITALS — BP 130/60 | HR 69 | Temp 97.2°F | Resp 16 | Ht 65.0 in | Wt 158.8 lb

## 2019-07-17 DIAGNOSIS — I10 Essential (primary) hypertension: Secondary | ICD-10-CM

## 2019-07-17 DIAGNOSIS — E669 Obesity, unspecified: Secondary | ICD-10-CM

## 2019-07-17 DIAGNOSIS — F330 Major depressive disorder, recurrent, mild: Secondary | ICD-10-CM | POA: Diagnosis not present

## 2019-07-17 DIAGNOSIS — I152 Hypertension secondary to endocrine disorders: Secondary | ICD-10-CM

## 2019-07-17 DIAGNOSIS — E1169 Type 2 diabetes mellitus with other specified complication: Secondary | ICD-10-CM

## 2019-07-17 DIAGNOSIS — Z136 Encounter for screening for cardiovascular disorders: Secondary | ICD-10-CM | POA: Diagnosis not present

## 2019-07-17 DIAGNOSIS — E782 Mixed hyperlipidemia: Secondary | ICD-10-CM

## 2019-07-17 DIAGNOSIS — E039 Hypothyroidism, unspecified: Secondary | ICD-10-CM | POA: Diagnosis not present

## 2019-07-17 DIAGNOSIS — E1159 Type 2 diabetes mellitus with other circulatory complications: Secondary | ICD-10-CM

## 2019-07-17 NOTE — Progress Notes (Signed)
Subjective:  Patient ID: Cassandra Fox, female    DOB: January 27, 1955  Age: 64 y.o. MRN: [id number]  Chief Complaint  Patient presents with  . Hypertension  . Hyperlipidemia  . Hypothyroidism    : chronic visit  Patient presents for follow up of hypertension.  Patient tolerating losartan hctz well with side effects.  Patient was diagnosed with hypertension 2010 so has been treated for hypertension for 10 years.Patient is working on maintaining diet and exercise regimen and follows up as directed. Complication include none.  Patient presents with hyperlipidemia.  Compliance with treatment has been good; patient takes medicines as directed, maintains low cholesterol diet, follows up as directed, and maintains exercise regimen.  Patient is using simvastatin without problems.  Patient has HYPOTHYROIDISM.  Diagnosed 10 years ago.  Patient has stable thyroid readings.  Patient is having normal.  Last TSH was 1.79.  continue dosage of thyroid medicine.   Current Outpatient Medications on File Prior to Visit  Medication Sig Dispense Refill  . ALPRAZolam (XANAX) 0.5 MG tablet TAKE 1 TABLET BY MOUTH EVERY DAY AS NEEDED FOR PANIC ATTACKS. MAX OF 3 TABLETS DAILY 30 tablet 3  . aspirin EC 81 MG tablet Take 81 mg by mouth daily.    . citalopram (CELEXA) 20 MG tablet Take 1 tablet (20 mg total) by mouth daily. 30 tablet 2  . levothyroxine (SYNTHROID) 75 MCG tablet Take 75 mcg by mouth daily before breakfast.     . losartan-hydrochlorothiazide (HYZAAR) 100-25 MG tablet Take 1 tablet by mouth daily. 90 tablet 2  . metFORMIN (GLUCOPHAGE) 500 MG tablet TAKE 1 TABLET BY MOUTH TWICE DAILY. 180 tablet 2  . ONETOUCH VERIO test strip     . simvastatin (ZOCOR) 20 MG tablet Take 20 mg by mouth daily.    . Vitamin D, Cholecalciferol, 1000 units CAPS Take by mouth.    . Zinc Sulfate (ZINC 15 PO) Take by mouth.     No current facility-administered medications on file prior to visit.   Past Medical History:    Diagnosis Date  . Anxiety   . Breast mass, right   . Depression   . Diabetes mellitus without complication ([place])   . Hyperlipidemia   . Hypertension   . Hypothyroidism    Past Surgical History:  Procedure Laterality Date  . BREAST EXCISIONAL BIOPSY Right    benign  . BREAST LUMPECTOMY WITH RADIOACTIVE SEED LOCALIZATION Right 09/23/2015   Procedure: RIGHT BREAST LUMPECTOMY WITH RADIOACTIVE SEED LOCALIZATION;  Surgeon: Excell Seltzer, MD;  Location: [facility];  Service: General;  Laterality: Right;    Family History  Problem Relation Age of Onset  . Hyperlipidemia Mother   . Diabetes Mother   . Hyperlipidemia Father   . Hypertension Father    Social History   Socioeconomic History  . Marital status: Married    Spouse name: Not on file  . Number of children: 3  . Years of education: Not on file  . Highest education level: Not on file  Occupational History  . Not on file  Tobacco Use  . Smoking status: Current Every Day Smoker    Packs/day: 0.50    Types: Cigarettes  . Smokeless tobacco: Never Used  Vaping Use  . Vaping Use: Never used  Substance and Sexual Activity  . Alcohol use: Yes    Comment: social  . Drug use: No  . Sexual activity: Not on file  Other Topics Concern  . Not on file  Social History Narrative  . Not on file   Social Determinants of Health   Financial Resource Strain:   . Difficulty of Paying Living Expenses:   Food Insecurity:   . Worried About Charity fundraiser in the Last Year:   . Arboriculturist in the Last Year:   Transportation Needs:   . Film/video editor (Medical):   Marland Kitchen Lack of Transportation (Non-Medical):   Physical Activity:   . Days of Exercise per Week:   . Minutes of Exercise per Session:   Stress:   . Feeling of Stress :   Social Connections:   . Frequency of Communication with Friends and Family:   . Frequency of Social Gatherings with Friends and Family:   . Attends Religious Services:   .  Active Member of Clubs or Organizations:   . Attends Archivist Meetings:   Marland Kitchen Marital Status:   Review of Systems  Constitutional: Negative.   HENT: Negative.   Eyes: Negative.   Respiratory: Negative.   Cardiovascular: Negative.   Gastrointestinal: Negative.   Musculoskeletal: Negative.   Skin: Negative.   Neurological: Negative.   Psychiatric/Behavioral: Negative.       Objective:  BP 130/60   Pulse 69   Temp (!) 97.2 F (36.2 C)   Resp 16   Ht 5\' 5"  (1.651 m)   Wt 158 lb 12.8 oz (72 kg)   SpO2 98%   BMI 26.43 kg/m   BP/Weight 07/17/2019 9/48/5462 7/0/3500  Systolic BP 938 182 993  Diastolic BP 60 70 78  Wt. (Lbs) 158.8 160.8 160  BMI 26.43 26.76 27.46    Physical Exam Vitals reviewed.  Constitutional:      Appearance: Normal appearance.  HENT:     Head: Normocephalic and atraumatic.     Right Ear: Tympanic membrane normal.     Left Ear: Tympanic membrane normal.     Nose: Nose normal.     Mouth/Throat:     Mouth: Mucous membranes are dry.  Eyes:     Extraocular Movements: Extraocular movements intact.     Conjunctiva/sclera: Conjunctivae normal.     Pupils: Pupils are equal, round, and reactive to light.  Cardiovascular:     Rate and Rhythm: Normal rate and regular rhythm.     Pulses: Normal pulses.     Heart sounds: Normal heart sounds.  Pulmonary:     Effort: Pulmonary effort is normal.     Breath sounds: Normal breath sounds.  Abdominal:     General: Abdomen is flat. Bowel sounds are normal.     Palpations: Abdomen is soft.  Musculoskeletal:        General: Normal range of motion.     Cervical back: Normal range of motion and neck supple.  Skin:    General: Skin is warm.  Neurological:     General: No focal deficit present.     Mental Status: She is alert and oriented to person, place, and time.  Psychiatric:        Mood and Affect: Mood normal.        Behavior: Behavior normal.     Depression screen [facility] 2/9 07/17/2019    Decreased Interest 0  Down, Depressed, Hopeless 1  PHQ - 2 Score 1  Altered sleeping 1  Tired, decreased energy 1  Change in appetite 0  Feeling bad or failure about yourself  0  Trouble concentrating 0  Moving slowly or fidgety/restless 0  Suicidal thoughts 0  PHQ-9 Score 3  Difficult doing work/chores Not difficult at all    Lab Results  Component Value Date   WBC 11.8 (H) 07/17/2019   HGB 12.8 07/17/2019   HCT 39.5 07/17/2019   PLT 227 07/17/2019   GLUCOSE 105 (H) 07/17/2019   CHOL 151 07/17/2019   TRIG 171 (H) 07/17/2019   HDL 43 07/17/2019   LDLCALC 79 07/17/2019   ALT 20 07/17/2019   AST 20 07/17/2019   NA 131 (L) 07/17/2019   K 3.9 07/17/2019   CL 93 (L) 07/17/2019   CREATININE 0.83 07/17/2019   BUN 8 07/17/2019   CO2 24 07/17/2019   TSH 4.380 07/17/2019   HGBA1C 6.2 (H) 07/17/2019   MICROALBUR 30 03/19/2019      Assessment & Plan:   1. Mixed hyperlipidemia - Lipid panel AN INDIVIDUAL CARE PLAN for hyperlipidemia/ cholesterol was established and reinforced today.  The patient's status was assessed using clinical findings on exam, lab and other diagnostic tests. The patient's disease status was assessed based on evidence-based guidelines and found to be well controlled. MEDICATIONS were reviewed. SELF MANAGEMENT GOALS have been discussed and patient's success at attaining the goal of low cholesterol was assessed. RECOMMENDATION given include regular exercise 3 days a week and low cholesterol/low fat diet. CLINICAL SUMMARY including written plan to identify barriers unique to the patient due to social or economic  reasons was discussed.  2. Hypothyroidism, adult - TSH Patient is known to have hypothryoid and is n treatment with levothyroxine.  Patient was diagnosed 10 years ago.  Other treatment includes none.  Patient is compliant with medicines and last TSH 6 months ago.  Last TSH was 1.79.  3. Major depressive disorder, recurrent episode, mild  (HCC) Patient's depression is controlled with citalopram.   Anhedonia better.  PHQ 9 was performed score 4. An individual care plan was established or reinforced today.  The patient's disease status was assessed using clinical findings on exam, labs, and or other diagnostic testing to determine patient's success in meeting treatment goals based on disease specific evidence-based guidelines and found to be improving Recommendations include stay on medicines  4. Benign hypertension - CBC with Differential/Platelet - Comprehensive metabolic panel An individual hypertension care plan was established and reinforced today.  The patient's status was assessed using clinical findings on exam and labs or diagnostic tests. The patient's success at meeting treatment goals on disease specific evidence-based guidelines and found to be well controlled. SELF MANAGEMENT: The patient and I together assessed ways to personally work towards obtaining the recommended goals. RECOMMENDATIONS: avoid decongestants found in common cold remedies, decrease consumption of alcohol, perform routine monitoring of BP with home BP cuff, exercise, reduction of dietary salt, take medicines as prescribed, try not to miss doses and quit smoking.  Regular exercise and maintaining a healthy weight is needed.  Stress reduction may help. A CLINICAL SUMMARY including written plan identify barriers to care unique to individual due to social or financial issues.  We attempt to mutually creat solutions for individual and family understanding.  5. Obesity, diabetes, and hypertension syndrome (HCC) - Hemoglobin A1c An individual care plan for diabetes was established and reinforced today.  The patient's status was assessed using clinical findings on exam, labs and diagnostic testing. Patient success at meeting goals based on disease specific evidence-based guidelines and found to be good controlled. Medications were assessed and patient's  understanding of the medical issues , including barriers were assessed. Recommend adherence to a diabetic diet,  a graduated exercise program, HgbA1c level is checked quarterly, and urine microalbumin performed yearly .  Annual mono-filament sensation testing performed. Lower blood pressure and control hyperlipidemia is important. Get annual eye exams and annual flu shots and smoking cessation discussed.  Self management goals were discussed.  6. Screening for AAA (abdominal aortic aneurysm) - US AORTA This is stable and being followed.    No orders of the defined types were placed in this encounter.   Orders Placed This Encounter  Procedures  . US AORTA  . CBC with Differential/Platelet  . Comprehensive metabolic panel  . Lipid panel  . TSH  . Hemoglobin A1c  . Cardiovascular Risk Assessment     Follow-up: Return in about 4 months (around 11/16/2019) for fasting.  An After Visit Summary was printed and given to the patient.  [place] [phone]

## 2019-07-18 LAB — COMPREHENSIVE METABOLIC PANEL
ALT: 20 IU/L (ref 0–32)
AST: 20 IU/L (ref 0–40)
Albumin/Globulin Ratio: 1.4 (ref 1.2–2.2)
Albumin: 4.1 g/dL (ref 3.8–4.8)
Alkaline Phosphatase: 89 IU/L (ref 48–121)
BUN/Creatinine Ratio: 10 — ABNORMAL LOW (ref 12–28)
BUN: 8 mg/dL (ref 8–27)
Bilirubin Total: 0.3 mg/dL (ref 0.0–1.2)
CO2: 24 mmol/L (ref 20–29)
Calcium: 9.4 mg/dL (ref 8.7–10.3)
Chloride: 93 mmol/L — ABNORMAL LOW (ref 96–106)
Creatinine, Ser: 0.83 mg/dL (ref 0.57–1.00)
GFR calc Af Amer: 85 mL/min/{1.73_m2} (ref >59)
GFR calc non Af Amer: 74 mL/min/{1.73_m2} (ref >59)
Globulin, Total: 2.9 g/dL (ref 1.5–4.5)
Glucose: 105 mg/dL — ABNORMAL HIGH (ref 65–99)
Potassium: 3.9 mmol/L (ref 3.5–5.2)
Sodium: 131 mmol/L — ABNORMAL LOW (ref 134–144)
Total Protein: 7 g/dL (ref 6.0–8.5)

## 2019-07-18 LAB — CBC WITH DIFFERENTIAL/PLATELET
Basophils Absolute: 0.1 10*3/uL (ref 0.0–0.2)
Basos: 1 %
EOS (ABSOLUTE): 0.8 10*3/uL — ABNORMAL HIGH (ref 0.0–0.4)
Eos: 7 %
Hematocrit: 39.5 % (ref 34.0–46.6)
Hemoglobin: 12.8 g/dL (ref 11.1–15.9)
Immature Grans (Abs): 0.1 10*3/uL (ref 0.0–0.1)
Immature Granulocytes: 1 %
Lymphocytes Absolute: 2.6 10*3/uL (ref 0.7–3.1)
Lymphs: 22 %
MCH: 28.4 pg (ref 26.6–33.0)
MCHC: 32.4 g/dL (ref 31.5–35.7)
MCV: 88 fL (ref 79–97)
Monocytes Absolute: 1 10*3/uL — ABNORMAL HIGH (ref 0.1–0.9)
Monocytes: 9 %
Neutrophils Absolute: 7.2 10*3/uL — ABNORMAL HIGH (ref 1.4–7.0)
Neutrophils: 60 %
Platelets: 227 10*3/uL (ref 150–450)
RBC: 4.5 x10E6/uL (ref 3.77–5.28)
RDW: 12.9 % (ref 11.7–15.4)
WBC: 11.8 10*3/uL — ABNORMAL HIGH (ref 3.4–10.8)

## 2019-07-18 LAB — CARDIOVASCULAR RISK ASSESSMENT

## 2019-07-18 LAB — HEMOGLOBIN A1C
Est. average glucose Bld gHb Est-mCnc: 131 mg/dL
Hgb A1c MFr Bld: 6.2 % — ABNORMAL HIGH (ref 4.8–5.6)

## 2019-07-18 LAB — LIPID PANEL
Chol/HDL Ratio: 3.5 ratio (ref 0.0–4.4)
Cholesterol, Total: 151 mg/dL (ref 100–199)
HDL: 43 mg/dL (ref >39)
LDL Chol Calc (NIH): 79 mg/dL (ref 0–99)
Triglycerides: 171 mg/dL — ABNORMAL HIGH (ref 0–149)
VLDL Cholesterol Cal: 29 mg/dL (ref 5–40)

## 2019-07-18 LAB — TSH: TSH: 4.38 u[IU]/mL (ref 0.450–4.500)

## 2019-07-18 NOTE — Progress Notes (Signed)
WBC still high- chronic, glucose 105, kidney and liver tests normal, Triglycerides high- watch diet, TSH 4.38 normal, A1c 6.2 lp

## 2019-07-31 ENCOUNTER — Other Ambulatory Visit: Payer: Self-pay

## 2019-07-31 DIAGNOSIS — I10 Essential (primary) hypertension: Secondary | ICD-10-CM | POA: Diagnosis not present

## 2019-07-31 DIAGNOSIS — I708 Atherosclerosis of other arteries: Secondary | ICD-10-CM | POA: Diagnosis not present

## 2019-08-04 ENCOUNTER — Telehealth: Payer: Self-pay

## 2019-08-04 NOTE — Telephone Encounter (Signed)
I left detailed message on her voicemail about the results of Complete duplex arterial study.

## 2019-09-22 ENCOUNTER — Other Ambulatory Visit: Payer: Self-pay | Admitting: Legal Medicine

## 2019-10-07 ENCOUNTER — Other Ambulatory Visit: Payer: Self-pay | Admitting: Legal Medicine

## 2019-10-09 ENCOUNTER — Ambulatory Visit (INDEPENDENT_AMBULATORY_CARE_PROVIDER_SITE_OTHER): Payer: PPO

## 2019-10-09 ENCOUNTER — Other Ambulatory Visit: Payer: Self-pay

## 2019-10-09 DIAGNOSIS — Z23 Encounter for immunization: Secondary | ICD-10-CM

## 2019-10-22 ENCOUNTER — Other Ambulatory Visit: Payer: Self-pay | Admitting: Legal Medicine

## 2019-10-30 ENCOUNTER — Encounter: Payer: Self-pay | Admitting: Legal Medicine

## 2019-10-30 ENCOUNTER — Other Ambulatory Visit: Payer: Self-pay

## 2019-10-30 ENCOUNTER — Ambulatory Visit (INDEPENDENT_AMBULATORY_CARE_PROVIDER_SITE_OTHER): Payer: PPO | Admitting: Legal Medicine

## 2019-10-30 VITALS — BP 130/70 | HR 74 | Temp 97.6°F | Resp 16 | Ht 65.0 in | Wt 158.0 lb

## 2019-10-30 DIAGNOSIS — N3090 Cystitis, unspecified without hematuria: Secondary | ICD-10-CM | POA: Diagnosis not present

## 2019-10-30 LAB — POCT URINALYSIS DIP (CLINITEK)
Bilirubin, UA: NEGATIVE
Blood, UA: NEGATIVE
Glucose, UA: NEGATIVE mg/dL
Ketones, POC UA: NEGATIVE mg/dL
Nitrite, UA: NEGATIVE
POC PROTEIN,UA: NEGATIVE
Spec Grav, UA: <=1.005 — AB (ref 1.010–1.025)
Urobilinogen, UA: 0.2 E.U./dL
pH, UA: 7.5 (ref 5.0–8.0)

## 2019-10-30 MED ORDER — SULFAMETHOXAZOLE-TRIMETHOPRIM 800-160 MG PO TABS: 1.0000 | ORAL_TABLET | Freq: Two times a day (BID) | ORAL | 0 refills | Status: DC

## 2019-10-30 NOTE — Progress Notes (Signed)
Subjective:  Patient ID: Cassandra Fox, female    DOB: 30-Aug-1955  Age: 64 y.o. MRN: [id number]  Chief Complaint  Patient presents with  . Urinary Tract Infection    Patient has cramps on her abdomen since 4 days ago    HPI: patient having dysuria  And cramping for 3 days.  No fever or chills   Current Outpatient Medications on File Prior to Visit  Medication Sig Dispense Refill  . ALPRAZolam (XANAX) 0.5 MG tablet TAKE 1 TABLET BY MOUTH EVERY DAY AS NEEDED FOR PANIC ATTACKS. MAX OF 3 TABLETS DAILY 30 tablet 3  . aspirin EC 81 MG tablet Take 81 mg by mouth daily.    . citalopram (CELEXA) 20 MG tablet TAKE 1 TABLET(20 MG) BY MOUTH DAILY 30 tablet 6  . glucose blood (ONETOUCH VERIO) test strip USE TO CHECK BLOOD SUGAR TWICE DAILY 100 strip 4  . levothyroxine (SYNTHROID) 75 MCG tablet Take 75 mcg by mouth daily before breakfast.     . losartan-hydrochlorothiazide (HYZAAR) 100-25 MG tablet Take 1 tablet by mouth daily. 90 tablet 2  . metFORMIN (GLUCOPHAGE) 500 MG tablet TAKE 1 TABLET BY MOUTH TWICE DAILY. 180 tablet 2  . simvastatin (ZOCOR) 20 MG tablet TAKE 1 TABLET BY MOUTH AT BEDTIME 90 tablet 2  . Vitamin D, Cholecalciferol, 1000 units CAPS Take by mouth.    . Zinc Sulfate (ZINC 15 PO) Take by mouth.     No current facility-administered medications on file prior to visit.   Past Medical History:  Diagnosis Date  . Anxiety   . Breast mass, right   . Depression   . Diabetes mellitus without complication ([place])   . Hyperlipidemia   . Hypertension   . Hypothyroidism    Past Surgical History:  Procedure Laterality Date  . BREAST EXCISIONAL BIOPSY Right    benign  . BREAST LUMPECTOMY WITH RADIOACTIVE SEED LOCALIZATION Right 09/23/2015   Procedure: RIGHT BREAST LUMPECTOMY WITH RADIOACTIVE SEED LOCALIZATION;  Surgeon: Excell Seltzer, MD;  Location: [facility];  Service: General;  Laterality: Right;    Family History  Problem Relation Age of Onset  .  Hyperlipidemia Mother   . Diabetes Mother   . Hyperlipidemia Father   . Hypertension Father    Social History   Socioeconomic History  . Marital status: Married    Spouse name: Not on file  . Number of children: 3  . Years of education: Not on file  . Highest education level: Not on file  Occupational History  . Not on file  Tobacco Use  . Smoking status: Current Every Day Smoker    Packs/day: 0.50    Types: Cigarettes  . Smokeless tobacco: Never Used  Vaping Use  . Vaping Use: Never used  Substance and Sexual Activity  . Alcohol use: Not Currently    Comment: social  . Drug use: No  . Sexual activity: Not Currently  Other Topics Concern  . Not on file  Social History Narrative  . Not on file   Social Determinants of Health   Financial Resource Strain:   . Difficulty of Paying Living Expenses: Not on file  Food Insecurity:   . Worried About Charity fundraiser in the Last Year: Not on file  . Ran Out of Food in the Last Year: Not on file  Transportation Needs:   . Lack of Transportation (Medical): Not on file  . Lack of Transportation (Non-Medical): Not on file  Physical Activity:   .  Days of Exercise per Week: Not on file  . Minutes of Exercise per Session: Not on file  Stress:   . Feeling of Stress : Not on file  Social Connections:   . Frequency of Communication with Friends and Family: Not on file  . Frequency of Social Gatherings with Friends and Family: Not on file  . Attends Religious Services: Not on file  . Active Member of Clubs or Organizations: Not on file  . Attends Archivist Meetings: Not on file  . Marital Status: Not on file    Review of Systems  Constitutional: Negative.   HENT: Negative.   Eyes: Negative.   Respiratory: Negative for cough and shortness of breath.   Cardiovascular: Negative for chest pain, palpitations and leg swelling.  Genitourinary: Positive for difficulty urinating and dysuria.  Musculoskeletal: Negative.    Skin: Negative.   Neurological: Negative.      Objective:  BP 130/70   Pulse 74   Temp 97.6 F (36.4 C)   Resp 16   Ht 5\' 5"  (1.651 m)   Wt 158 lb (71.7 kg)   BMI 26.29 kg/m   BP/Weight 10/30/2019 07/17/2019 0/93/2671  Systolic BP 245 809 983  Diastolic BP 70 60 70  Wt. (Lbs) 158 158.8 160.8  BMI 26.29 26.43 26.76    Physical Exam Vitals reviewed.  Constitutional:      Appearance: Normal appearance.  HENT:     Head: Normocephalic.     Right Ear: Tympanic membrane, ear canal and external ear normal.     Left Ear: Tympanic membrane, ear canal and external ear normal.     Mouth/Throat:     Mouth: Mucous membranes are moist.     Pharynx: Oropharynx is clear.  Eyes:     Extraocular Movements: Extraocular movements intact.     Conjunctiva/sclera: Conjunctivae normal.     Pupils: Pupils are equal, round, and reactive to light.  Cardiovascular:     Rate and Rhythm: Normal rate and regular rhythm.     Pulses: Normal pulses.     Heart sounds: Normal heart sounds.  Pulmonary:     Effort: Pulmonary effort is normal.     Breath sounds: Normal breath sounds.  Abdominal:     General: Abdomen is flat. Bowel sounds are normal.     Palpations: Abdomen is soft.     Comments: No CVA pain  Neurological:     General: No focal deficit present.     Mental Status: She is alert.       Lab Results  Component Value Date   WBC 11.8 (H) 07/17/2019   HGB 12.8 07/17/2019   HCT 39.5 07/17/2019   PLT 227 07/17/2019   GLUCOSE 105 (H) 07/17/2019   CHOL 151 07/17/2019   TRIG 171 (H) 07/17/2019   HDL 43 07/17/2019   LDLCALC 79 07/17/2019   ALT 20 07/17/2019   AST 20 07/17/2019   NA 131 (L) 07/17/2019   K 3.9 07/17/2019   CL 93 (L) 07/17/2019   CREATININE 0.83 07/17/2019   BUN 8 07/17/2019   CO2 24 07/17/2019   TSH 4.380 07/17/2019   HGBA1C 6.2 (H) 07/17/2019   MICROALBUR 30 03/19/2019      Assessment & Plan:   1. Cystitis - POCT URINALYSIS DIP (CLINITEK) Patient has  cystitis  symptoms  Meds ordered this encounter  Medications  . sulfamethoxazole-trimethoprim (BACTRIM DS) 800-160 MG tablet    Sig: Take 1 tablet by mouth 2 (two) times daily.  Dispense:  14 tablet    Refill:  0    Orders Placed This Encounter  Procedures  . Urine Culture  . POCT URINALYSIS DIP (CLINITEK)     Follow-up: No follow-ups on file.  An After Visit Summary was printed and given to the patient.  [place] [phone]

## 2019-11-01 LAB — URINE CULTURE

## 2019-11-01 NOTE — Progress Notes (Signed)
Only 10,00 organism in urine culture, officailly neg culture lp

## 2019-11-02 ENCOUNTER — Other Ambulatory Visit: Payer: Self-pay | Admitting: Legal Medicine

## 2019-11-02 ENCOUNTER — Telehealth: Payer: Self-pay

## 2019-11-02 DIAGNOSIS — N3000 Acute cystitis without hematuria: Secondary | ICD-10-CM

## 2019-11-02 MED ORDER — NITROFURANTOIN MONOHYD MACRO 100 MG PO CAPS: 100.0000 mg | ORAL_CAPSULE | Freq: Two times a day (BID) | ORAL | 0 refills | Status: DC

## 2019-11-02 NOTE — Telephone Encounter (Signed)
Macrobid sent in lp

## 2019-11-02 NOTE — Telephone Encounter (Signed)
Pt sts that she believe that the Bactrim is making her sick and wanted to know if she should cont. taking it or can you call her in another medication for her UTI.

## 2019-11-04 NOTE — Telephone Encounter (Signed)
I left message on voicemail to call me back.

## 2019-11-05 ENCOUNTER — Other Ambulatory Visit: Payer: Self-pay | Admitting: Legal Medicine

## 2019-11-06 ENCOUNTER — Other Ambulatory Visit: Payer: Self-pay | Admitting: Legal Medicine

## 2019-11-06 MED ORDER — ALPRAZOLAM 0.5 MG PO TABS: 0.5000 mg | ORAL_TABLET | Freq: Every evening | ORAL | 3 refills | Status: DC | PRN

## 2019-11-10 ENCOUNTER — Other Ambulatory Visit: Payer: Self-pay | Admitting: Legal Medicine

## 2019-11-10 DIAGNOSIS — F330 Major depressive disorder, recurrent, mild: Secondary | ICD-10-CM

## 2019-11-10 MED ORDER — ALPRAZOLAM 0.5 MG PO TABS: 0.5000 mg | ORAL_TABLET | Freq: Every evening | ORAL | 3 refills | Status: DC | PRN

## 2019-11-20 ENCOUNTER — Ambulatory Visit (INDEPENDENT_AMBULATORY_CARE_PROVIDER_SITE_OTHER): Payer: PPO | Admitting: Legal Medicine

## 2019-11-20 ENCOUNTER — Encounter: Payer: Self-pay | Admitting: Legal Medicine

## 2019-11-20 ENCOUNTER — Other Ambulatory Visit: Payer: Self-pay

## 2019-11-20 VITALS — BP 124/60 | HR 74 | Temp 97.2°F | Resp 16 | Ht 65.0 in | Wt 157.0 lb

## 2019-11-20 DIAGNOSIS — N3000 Acute cystitis without hematuria: Secondary | ICD-10-CM | POA: Diagnosis not present

## 2019-11-20 DIAGNOSIS — F330 Major depressive disorder, recurrent, mild: Secondary | ICD-10-CM

## 2019-11-20 DIAGNOSIS — I10 Essential (primary) hypertension: Secondary | ICD-10-CM

## 2019-11-20 DIAGNOSIS — I152 Hypertension secondary to endocrine disorders: Secondary | ICD-10-CM | POA: Diagnosis not present

## 2019-11-20 DIAGNOSIS — E782 Mixed hyperlipidemia: Secondary | ICD-10-CM

## 2019-11-20 DIAGNOSIS — E669 Obesity, unspecified: Secondary | ICD-10-CM

## 2019-11-20 DIAGNOSIS — E039 Hypothyroidism, unspecified: Secondary | ICD-10-CM

## 2019-11-20 DIAGNOSIS — E1159 Type 2 diabetes mellitus with other circulatory complications: Secondary | ICD-10-CM | POA: Diagnosis not present

## 2019-11-20 DIAGNOSIS — E1169 Type 2 diabetes mellitus with other specified complication: Secondary | ICD-10-CM

## 2019-11-20 LAB — POCT URINALYSIS DIP (CLINITEK)
Bilirubin, UA: NEGATIVE
Blood, UA: NEGATIVE
Glucose, UA: NEGATIVE mg/dL
Ketones, POC UA: NEGATIVE mg/dL
Nitrite, UA: NEGATIVE
POC PROTEIN,UA: NEGATIVE
Spec Grav, UA: 1.015 (ref 1.010–1.025)
Urobilinogen, UA: 0.2 E.U./dL
pH, UA: 7 (ref 5.0–8.0)

## 2019-11-20 NOTE — Progress Notes (Signed)
Acute Office Visit  Subjective:    Patient ID: Cassandra GRIEVES, female    DOB: 1955/08/07, 64 y.o.   MRN: [id number]  Chief Complaint  Patient presents with  . Diabetes  . Hyperlipidemia    HPI : chronic visit  Patient presents for follow up of hypertension.  Patient tolerating losartan/hctz well with side effects.  Patient was diagnosed with hypertension 2010 so has been treated for hypertension for 10 years.Patient is working on maintaining diet and exercise regimen and follows up as directed. Complication include none.  Patient present with type 2 diabetes.  Specifically, this is type 2, noninsulin requiring diabetes, complicated by hypertension and hyperlipidemia.  Compliance with treatment has been good; patient take medicines as directed, maintains diet and exercise regimen, follows up as directed, and is keeping glucose diary.  Date of  diagnosis 2010.  Depression screen has been performed.Tobacco screen nonsmoker. Current medicines for diabetes metformin.  Patient is on losartan for renal protection and simvastatin for cholesterol control.  Patient performs foot exams daily and last ophthalmologic exam was yes.  Patient has HYPOTHYROIDISM.  Diagnosed 10 years ago.  Patient has stable thyroid readings.  Patient is having no symptoms.  Last TSH was normal.  continue dosage of thyroid medicine.  Patient presents with hyperlipidemia.  Compliance with treatment has been good; patient takes medicines as directed, maintains low cholesterol diet, follows up as directed, and maintains exercise regimen.  Patient is using simvastatin without problems.   Past Medical History:  Diagnosis Date  . Anxiety   . Breast mass, right   . Depression   . Diabetes mellitus without complication ([place])   . Hyperlipidemia   . Hypertension   . Hypothyroidism     Past Surgical History:  Procedure Laterality Date  . BREAST EXCISIONAL BIOPSY Right    benign  . BREAST LUMPECTOMY WITH RADIOACTIVE  SEED LOCALIZATION Right 09/23/2015   Procedure: RIGHT BREAST LUMPECTOMY WITH RADIOACTIVE SEED LOCALIZATION;  Surgeon: Excell Seltzer, MD;  Location: [facility];  Service: General;  Laterality: Right;    Family History  Problem Relation Age of Onset  . Hyperlipidemia Mother   . Diabetes Mother   . Hyperlipidemia Father   . Hypertension Father     Social History   Socioeconomic History  . Marital status: Married    Spouse name: Not on file  . Number of children: 3  . Years of education: Not on file  . Highest education level: Not on file  Occupational History  . Not on file  Tobacco Use  . Smoking status: Current Every Day Smoker    Packs/day: 0.50    Types: Cigarettes  . Smokeless tobacco: Never Used  Vaping Use  . Vaping Use: Never used  Substance and Sexual Activity  . Alcohol use: Not Currently    Comment: social  . Drug use: No  . Sexual activity: Not Currently  Other Topics Concern  . Not on file  Social History Narrative  . Not on file   Social Determinants of Health   Financial Resource Strain:   . Difficulty of Paying Living Expenses: Not on file  Food Insecurity:   . Worried About Charity fundraiser in the Last Year: Not on file  . Ran Out of Food in the Last Year: Not on file  Transportation Needs:   . Lack of Transportation (Medical): Not on file  . Lack of Transportation (Non-Medical): Not on file  Physical Activity:   . Days  of Exercise per Week: Not on file  . Minutes of Exercise per Session: Not on file  Stress:   . Feeling of Stress : Not on file  Social Connections:   . Frequency of Communication with Friends and Family: Not on file  . Frequency of Social Gatherings with Friends and Family: Not on file  . Attends Religious Services: Not on file  . Active Member of Clubs or Organizations: Not on file  . Attends Archivist Meetings: Not on file  . Marital Status: Not on file  Intimate Partner Violence:   . Fear of  Current or Ex-Partner: Not on file  . Emotionally Abused: Not on file  . Physically Abused: Not on file  . Sexually Abused: Not on file    Outpatient Medications Prior to Visit  Medication Sig Dispense Refill  . ALPRAZolam (XANAX) 0.5 MG tablet Take 1 tablet (0.5 mg total) by mouth at bedtime as needed for anxiety. 30 tablet 3  . aspirin EC 81 MG tablet Take 81 mg by mouth daily.    . citalopram (CELEXA) 20 MG tablet TAKE 1 TABLET(20 MG) BY MOUTH DAILY 30 tablet 6  . glucose blood (ONETOUCH VERIO) test strip USE TO CHECK BLOOD SUGAR TWICE DAILY 100 strip 4  . levothyroxine (SYNTHROID) 75 MCG tablet Take 75 mcg by mouth daily before breakfast.     . losartan-hydrochlorothiazide (HYZAAR) 100-25 MG tablet Take 1 tablet by mouth daily. 90 tablet 2  . metFORMIN (GLUCOPHAGE) 500 MG tablet TAKE 1 TABLET BY MOUTH TWICE DAILY. 180 tablet 2  . simvastatin (ZOCOR) 20 MG tablet TAKE 1 TABLET BY MOUTH AT BEDTIME 90 tablet 2  . Vitamin D, Cholecalciferol, 1000 units CAPS Take by mouth.    . Zinc Sulfate (ZINC 15 PO) Take by mouth.    . nitrofurantoin, macrocrystal-monohydrate, (MACROBID) 100 MG capsule Take 1 capsule (100 mg total) by mouth 2 (two) times daily. 20 capsule 0  . sulfamethoxazole-trimethoprim (BACTRIM DS) 800-160 MG tablet Take 1 tablet by mouth 2 (two) times daily. 14 tablet 0   No facility-administered medications prior to visit.    Allergies  Allergen Reactions  . Codeine Nausea Only    Review of Systems  Constitutional: Negative.   Eyes: Negative.   Respiratory: Negative.  Negative for cough, choking and shortness of breath.   Cardiovascular: Negative for chest pain, palpitations and leg swelling.  Gastrointestinal: Negative.   Endocrine: Negative.  Negative for polydipsia, polyphagia and polyuria.  Genitourinary: Negative.   Musculoskeletal: Negative.   Skin: Negative.   Neurological: Negative.   Psychiatric/Behavioral: Negative.        Objective:    Physical  Exam Vitals reviewed.  Constitutional:      Appearance: Normal appearance.  HENT:     Head: Normocephalic and atraumatic.     Right Ear: Tympanic membrane, ear canal and external ear normal.     Left Ear: Tympanic membrane, ear canal and external ear normal.     Mouth/Throat:     Mouth: Mucous membranes are moist.     Pharynx: Oropharynx is clear.  Eyes:     Extraocular Movements: Extraocular movements intact.     Conjunctiva/sclera: Conjunctivae normal.     Pupils: Pupils are equal, round, and reactive to light.  Cardiovascular:     Rate and Rhythm: Normal rate and regular rhythm.     Pulses: Normal pulses.     Heart sounds: Normal heart sounds.  Pulmonary:     Effort: Pulmonary effort  is normal.     Breath sounds: Normal breath sounds.  Abdominal:     General: Abdomen is flat. Bowel sounds are normal.     Palpations: Abdomen is soft.  Musculoskeletal:        General: Normal range of motion.     Cervical back: Normal range of motion and neck supple.  Skin:    General: Skin is warm and dry.     Capillary Refill: Capillary refill takes less than 2 seconds.  Neurological:     General: No focal deficit present.     Mental Status: She is alert and oriented to person, place, and time. Mental status is at baseline.  Psychiatric:        Mood and Affect: Mood normal.        Behavior: Behavior normal.        Thought Content: Thought content normal.        Judgment: Judgment normal.    Depression screen [facility] 2/9 11/20/2019 07/17/2019  Decreased Interest 0 0  Down, Depressed, Hopeless 1 1  PHQ - 2 Score 1 1  Altered sleeping 1 1  Tired, decreased energy 1 1  Change in appetite 0 0  Feeling bad or failure about yourself  0 0  Trouble concentrating 0 0  Moving slowly or fidgety/restless 0 0  Suicidal thoughts 0 0  PHQ-9 Score 3 3  Difficult doing work/chores Not difficult at all Not difficult at all     BP 124/60   Pulse 74   Temp (!) 97.2 F (36.2 C)   Resp 16   Ht 5'  5" (1.651 m)   Wt 157 lb (71.2 kg)   SpO2 95%   BMI 26.13 kg/m  Wt Readings from Last 3 Encounters:  11/20/19 157 lb (71.2 kg)  10/30/19 158 lb (71.7 kg)  07/17/19 158 lb 12.8 oz (72 kg)    Health Maintenance Due  Topic Date Due  . Hepatitis C Screening  Never done  . TETANUS/TDAP  Never done  . COLONOSCOPY  Never done    There are no preventive care reminders to display for this patient.   Lab Results  Component Value Date   TSH 4.380 07/17/2019   Lab Results  Component Value Date   WBC 11.0 (H) 11/20/2019   HGB 12.5 11/20/2019   HCT 37.0 11/20/2019   MCV 89 11/20/2019   PLT 257 11/20/2019   Lab Results  Component Value Date   NA 138 11/20/2019   K 4.1 11/20/2019   CO2 25 11/20/2019   GLUCOSE 98 11/20/2019   BUN 12 11/20/2019   CREATININE 0.89 11/20/2019   BILITOT 0.3 11/20/2019   ALKPHOS 87 11/20/2019   AST 18 11/20/2019   ALT 18 11/20/2019   PROT 7.2 11/20/2019   ALBUMIN 4.3 11/20/2019   CALCIUM 9.4 11/20/2019   ANIONGAP 9 09/22/2015   Lab Results  Component Value Date   CHOL 154 11/20/2019   Lab Results  Component Value Date   HDL 40 11/20/2019   Lab Results  Component Value Date   LDLCALC 84 11/20/2019   Lab Results  Component Value Date   TRIG 178 (H) 11/20/2019   Lab Results  Component Value Date   CHOLHDL 3.9 11/20/2019   Lab Results  Component Value Date   HGBA1C 6.4 (H) 11/20/2019       Assessment & Plan:  1. Major depressive disorder, recurrent episode, mild ([place]) Patient's depression is controlled with celexa.   Anhedonia better.  PHQ 9 was performed score 3. An individual care plan was established or reinforced today.  The patient's disease status was assessed using clinical findings on exam, labs, and or other diagnostic testing to determine patient's success in meeting treatment goals based on disease specific evidence-based guidelines and found to be improving Recommendations include stay on medicine  2. Mixed  hyperlipidemia AN INDIVIDUAL CARE PLAN for hyperlipidemia/ cholesterol was established and reinforced today.  The patient's status was assessed using clinical findings on exam, lab and other diagnostic tests. The patient's disease status was assessed based on evidence-based guidelines and found to be well controlled. MEDICATIONS were reviewed. SELF MANAGEMENT GOALS have been discussed and patient's success at attaining the goal of low cholesterol was assessed. RECOMMENDATION given include regular exercise 3 days a week and low cholesterol/low fat diet. CLINICAL SUMMARY including written plan to identify barriers unique to the patient due to social or economic  reasons was discussed.  3. Hypothyroidism, adult Patient is known to have hypothyroidism and is n treatment with levothyroxine 66mcg.  Patient was diagnosed 10 years ago.  Other treatment includes none.  Patient is compliant with medicines and last TSH 6 months ago.  Last TSH was normal.  4. Benign hypertension An individual hypertension care plan was established and reinforced today.  The patient's status was assessed using clinical findings on exam and labs or diagnostic tests. The patient's success at meeting treatment goals on disease specific evidence-based guidelines and found to be well controlled. SELF MANAGEMENT: The patient and I together assessed ways to personally work towards obtaining the recommended goals. RECOMMENDATIONS: avoid decongestants found in common cold remedies, decrease consumption of alcohol, perform routine monitoring of BP with home BP cuff, exercise, reduction of dietary salt, take medicines as prescribed, try not to miss doses and quit smoking.  Regular exercise and maintaining a healthy weight is needed.  Stress reduction may help. A CLINICAL SUMMARY including written plan identify barriers to care unique to individual due to social or financial issues.  We attempt to mutually creat solutions for individual and  family understanding.  5. Obesity, diabetes, and hypertension syndrome ([place]) An individual care plan for diabetes was established and reinforced today.  The patient's status was assessed using clinical findings on exam, labs and diagnostic testing. Patient success at meeting goals based on disease specific evidence-based guidelines and found to be good controlled. Medications were assessed and patient's understanding of the medical issues , including barriers were assessed. Recommend adherence to a diabetic diet, a graduated exercise program, HgbA1c level is checked quarterly, and urine microalbumin performed yearly .  Annual mono-filament sensation testing performed. Lower blood pressure and control hyperlipidemia is important. Get annual eye exams and annual flu shots and smoking cessation discussed.  Self management goals were discussed.         Follow-up: Return in about 4 months (around 03/21/2020) for fasting.  An After Visit Summary was printed and given to the patient.  [place] [phone]

## 2019-11-21 LAB — HEMOGLOBIN A1C
Est. average glucose Bld gHb Est-mCnc: 137 mg/dL
Hgb A1c MFr Bld: 6.4 % — ABNORMAL HIGH (ref 4.8–5.6)

## 2019-11-21 LAB — COMPREHENSIVE METABOLIC PANEL
ALT: 18 IU/L (ref 0–32)
AST: 18 IU/L (ref 0–40)
Albumin/Globulin Ratio: 1.5 (ref 1.2–2.2)
Albumin: 4.3 g/dL (ref 3.8–4.8)
Alkaline Phosphatase: 87 IU/L (ref 44–121)
BUN/Creatinine Ratio: 13 (ref 12–28)
BUN: 12 mg/dL (ref 8–27)
Bilirubin Total: 0.3 mg/dL (ref 0.0–1.2)
CO2: 25 mmol/L (ref 20–29)
Calcium: 9.4 mg/dL (ref 8.7–10.3)
Chloride: 99 mmol/L (ref 96–106)
Creatinine, Ser: 0.89 mg/dL (ref 0.57–1.00)
GFR calc Af Amer: 78 mL/min/{1.73_m2} (ref >59)
GFR calc non Af Amer: 68 mL/min/{1.73_m2} (ref >59)
Globulin, Total: 2.9 g/dL (ref 1.5–4.5)
Glucose: 98 mg/dL (ref 65–99)
Potassium: 4.1 mmol/L (ref 3.5–5.2)
Sodium: 138 mmol/L (ref 134–144)
Total Protein: 7.2 g/dL (ref 6.0–8.5)

## 2019-11-21 LAB — CBC WITH DIFFERENTIAL/PLATELET
Basophils Absolute: 0.2 10*3/uL (ref 0.0–0.2)
Basos: 1 %
EOS (ABSOLUTE): 0.7 10*3/uL — ABNORMAL HIGH (ref 0.0–0.4)
Eos: 6 %
Hematocrit: 37 % (ref 34.0–46.6)
Hemoglobin: 12.5 g/dL (ref 11.1–15.9)
Immature Grans (Abs): 0.1 10*3/uL (ref 0.0–0.1)
Immature Granulocytes: 1 %
Lymphocytes Absolute: 2.4 10*3/uL (ref 0.7–3.1)
Lymphs: 22 %
MCH: 30.1 pg (ref 26.6–33.0)
MCHC: 33.8 g/dL (ref 31.5–35.7)
MCV: 89 fL (ref 79–97)
Monocytes Absolute: 1 10*3/uL — ABNORMAL HIGH (ref 0.1–0.9)
Monocytes: 9 %
Neutrophils Absolute: 6.7 10*3/uL (ref 1.4–7.0)
Neutrophils: 61 %
Platelets: 257 10*3/uL (ref 150–450)
RBC: 4.15 x10E6/uL (ref 3.77–5.28)
RDW: 13 % (ref 11.7–15.4)
WBC: 11 10*3/uL — ABNORMAL HIGH (ref 3.4–10.8)

## 2019-11-21 LAB — LIPID PANEL
Chol/HDL Ratio: 3.9 ratio (ref 0.0–4.4)
Cholesterol, Total: 154 mg/dL (ref 100–199)
HDL: 40 mg/dL (ref >39)
LDL Chol Calc (NIH): 84 mg/dL (ref 0–99)
Triglycerides: 178 mg/dL — ABNORMAL HIGH (ref 0–149)
VLDL Cholesterol Cal: 30 mg/dL (ref 5–40)

## 2019-11-21 LAB — CARDIOVASCULAR RISK ASSESSMENT

## 2019-11-22 LAB — URINE CULTURE

## 2019-11-22 NOTE — Progress Notes (Signed)
No changes in CBC, glucose 105, sodium 131, slightly low, liver tests normal, A1c 6.4, Triglycerides high watch diet. lp

## 2019-11-23 NOTE — Progress Notes (Signed)
Urine culture negative ?lp

## 2019-11-29 ENCOUNTER — Other Ambulatory Visit: Payer: Self-pay | Admitting: Legal Medicine

## 2019-12-07 ENCOUNTER — Other Ambulatory Visit: Payer: Self-pay | Admitting: Legal Medicine

## 2019-12-10 DIAGNOSIS — H40013 Open angle with borderline findings, low risk, bilateral: Secondary | ICD-10-CM | POA: Diagnosis not present

## 2019-12-10 DIAGNOSIS — H3530 Unspecified macular degeneration: Secondary | ICD-10-CM | POA: Diagnosis not present

## 2019-12-10 DIAGNOSIS — H2513 Age-related nuclear cataract, bilateral: Secondary | ICD-10-CM | POA: Diagnosis not present

## 2020-01-12 ENCOUNTER — Other Ambulatory Visit: Payer: Self-pay

## 2020-01-12 ENCOUNTER — Encounter: Payer: Self-pay | Admitting: Physician Assistant

## 2020-01-12 ENCOUNTER — Ambulatory Visit (INDEPENDENT_AMBULATORY_CARE_PROVIDER_SITE_OTHER): Payer: PPO | Admitting: Physician Assistant

## 2020-01-12 VITALS — BP 144/64 | HR 67 | Temp 97.7°F | Resp 16 | Ht 65.0 in | Wt 160.2 lb

## 2020-01-12 DIAGNOSIS — L509 Urticaria, unspecified: Secondary | ICD-10-CM | POA: Diagnosis not present

## 2020-01-12 MED ORDER — TRIAMCINOLONE ACETONIDE 40 MG/ML IJ SUSP
60.0000 mg | Freq: Once | INTRAMUSCULAR | Status: AC
  Administered 2020-01-12: 60 mg via INTRAMUSCULAR

## 2020-01-12 MED ORDER — PREDNISONE 20 MG PO TABS: ORAL_TABLET | ORAL | 0 refills | Status: AC

## 2020-01-12 NOTE — Progress Notes (Signed)
Acute Office Visit  Subjective:    Patient ID: Cassandra Fox, female    DOB: 1955-11-14, 64 y.o.   MRN: [id number]  Chief Complaint  Patient presents with   Rash    On chest, Started 12/16, states she has been taking Claritin and benadryl and using OTC HC cream. States rash was itchy but medications have helped.    HPI Patient is in today for hives - states that she has had problems intermittently for years with flare ups - thinks mostly due to anxiety - this reaction is on her breasts - raised, itchy  Past Medical History:  Diagnosis Date   Anxiety    Breast mass, right    Depression    Diabetes mellitus without complication ([place])    Hyperlipidemia    Hypertension    Hypothyroidism     Past Surgical History:  Procedure Laterality Date   BREAST EXCISIONAL BIOPSY Right    benign   BREAST LUMPECTOMY WITH RADIOACTIVE SEED LOCALIZATION Right 09/23/2015   Procedure: RIGHT BREAST LUMPECTOMY WITH RADIOACTIVE SEED LOCALIZATION;  Surgeon: Excell Seltzer, MD;  Location: [facility];  Service: General;  Laterality: Right;    Family History  Problem Relation Age of Onset   Hyperlipidemia Mother    Diabetes Mother    Hyperlipidemia Father    Hypertension Father     Social History   Socioeconomic History   Marital status: Married    Spouse name: Not on file   Number of children: 3   Years of education: Not on file   Highest education level: Not on file  Occupational History   Not on file  Tobacco Use   Smoking status: Current Every Day Smoker    Packs/day: 0.50    Types: Cigarettes   Smokeless tobacco: Never Used  Vaping Use   Vaping Use: Never used  Substance and Sexual Activity   Alcohol use: Not Currently    Comment: social   Drug use: No   Sexual activity: Not Currently  Other Topics Concern   Not on file  Social History Narrative   Not on file   Social Determinants of Health   Financial Resource Strain:  Not on file  Food Insecurity: Not on file  Transportation Needs: Not on file  Physical Activity: Not on file  Stress: Not on file  Social Connections: Not on file  Intimate Partner Violence: Not on file    Outpatient Medications Prior to Visit  Medication Sig Dispense Refill   ALPRAZolam (XANAX) 0.5 MG tablet Take 1 tablet (0.5 mg total) by mouth at bedtime as needed for anxiety. 30 tablet 3   aspirin EC 81 MG tablet Take 81 mg by mouth daily.     citalopram (CELEXA) 20 MG tablet TAKE 1 TABLET(20 MG) BY MOUTH DAILY 30 tablet 6   glucose blood (ONETOUCH VERIO) test strip USE TO CHECK BLOOD SUGAR TWICE DAILY 100 strip 4   levothyroxine (SYNTHROID) 75 MCG tablet TAKE 1 TABLET(75 MCG) BY MOUTH EVERY DAY 30 tablet 6   losartan-hydrochlorothiazide (HYZAAR) 100-25 MG tablet Take 1 tablet by mouth daily. 90 tablet 2   metFORMIN (GLUCOPHAGE) 500 MG tablet TAKE 1 TABLET BY MOUTH TWICE DAILY. 180 tablet 2   PNEUMOVAX 23 25 MCG/0.5ML injection      simvastatin (ZOCOR) 20 MG tablet TAKE 1 TABLET BY MOUTH AT BEDTIME 90 tablet 2   Vitamin D, Cholecalciferol, 1000 units CAPS Take by mouth.     Zinc Sulfate (ZINC  15 PO) Take by mouth.     No facility-administered medications prior to visit.    Allergies  Allergen Reactions   Codeine Nausea Only    Review of Systems CONSTITUTIONAL: Negative for chills, fatigue, fever, unintentional weight gain and unintentional weight loss.  CARDIOVASCULAR: Negative for chest pain, dizziness, palpitations and pedal edema.  RESPIRATORY: Negative for recent cough and dyspnea.  INTEGUMENTARY: see HPI  PSYCHIATRIC: Negative for sleep disturbance and to question depression screen.  Negative for depression, negative for anhedonia.         Objective:    Physical Exam PHYSICAL EXAM:   VS: BP (!) 144/64 (BP Location: Left Arm, Patient Position: Sitting, Cuff Size: Normal)    Pulse 67    Temp 97.7 F (36.5 C) (Temporal)    Resp 16    Ht 5\' 5"  (1.651 m)     Wt 160 lb 3.2 oz (72.7 kg)    SpO2 97%    BMI 26.66 kg/m   GEN: Well nourished, well developed, in no acute distress   Cardiac: RRR; no murmurs, rubs, or gallops,no edema - no significant varicosities Respiratory:  normal respiratory rate and pattern with no distress - normal breath sounds with no rales, rhonchi, wheezes or rubs Skin: light pink urticaria noted on both breasts   BP (!) 144/64 (BP Location: Left Arm, Patient Position: Sitting, Cuff Size: Normal)    Pulse 67    Temp 97.7 F (36.5 C) (Temporal)    Resp 16    Ht 5\' 5"  (1.651 m)    Wt 160 lb 3.2 oz (72.7 kg)    SpO2 97%    BMI 26.66 kg/m  Wt Readings from Last 3 Encounters:  01/12/20 160 lb 3.2 oz (72.7 kg)  11/20/19 157 lb (71.2 kg)  10/30/19 158 lb (71.7 kg)    Health Maintenance Due  Topic Date Due   Hepatitis C Screening  Never done   TETANUS/TDAP  Never done   COLONOSCOPY  Never done   COVID-19 Vaccine (3 - Booster for Moderna series) 10/05/2019    There are no preventive care reminders to display for this patient.   Lab Results  Component Value Date   TSH 4.380 07/17/2019   Lab Results  Component Value Date   WBC 11.0 (H) 11/20/2019   HGB 12.5 11/20/2019   HCT 37.0 11/20/2019   MCV 89 11/20/2019   PLT 257 11/20/2019   Lab Results  Component Value Date   NA 138 11/20/2019   K 4.1 11/20/2019   CO2 25 11/20/2019   GLUCOSE 98 11/20/2019   BUN 12 11/20/2019   CREATININE 0.89 11/20/2019   BILITOT 0.3 11/20/2019   ALKPHOS 87 11/20/2019   AST 18 11/20/2019   ALT 18 11/20/2019   PROT 7.2 11/20/2019   ALBUMIN 4.3 11/20/2019   CALCIUM 9.4 11/20/2019   ANIONGAP 9 09/22/2015   Lab Results  Component Value Date   CHOL 154 11/20/2019   Lab Results  Component Value Date   HDL 40 11/20/2019   Lab Results  Component Value Date   LDLCALC 84 11/20/2019   Lab Results  Component Value Date   TRIG 178 (H) 11/20/2019   Lab Results  Component Value Date   CHOLHDL 3.9 11/20/2019   Lab  Results  Component Value Date   HGBA1C 6.4 (H) 11/20/2019       Assessment & Plan:  1. Hives - triamcinolone acetonide (KENALOG-40) injection 60 mg - predniSONE (DELTASONE) 20 MG tablet; Take 3  tablets (60 mg total) by mouth daily with breakfast for 3 days, THEN 2 tablets (40 mg total) daily with breakfast for 3 days, THEN 1 tablet (20 mg total) daily with breakfast for 3 days.  Dispense: 18 tablet; Refill: 0    Meds ordered this encounter  Medications   triamcinolone acetonide (KENALOG-40) injection 60 mg   predniSONE (DELTASONE) 20 MG tablet    Sig: Take 3 tablets (60 mg total) by mouth daily with breakfast for 3 days, THEN 2 tablets (40 mg total) daily with breakfast for 3 days, THEN 1 tablet (20 mg total) daily with breakfast for 3 days.    Dispense:  18 tablet    Refill:  0    Order Specific Question:   Supervising Provider    Answer:   Shelton Silvas    No orders of the defined types were placed in this encounter.     Follow-up: Return if symptoms worsen or fail to improve.  An After Visit Summary was printed and given to the patient.  Yetta Flock [facility] [phone]

## 2020-01-17 ENCOUNTER — Other Ambulatory Visit: Payer: Self-pay | Admitting: Legal Medicine

## 2020-01-25 ENCOUNTER — Telehealth: Payer: Self-pay | Admitting: Legal Medicine

## 2020-01-25 NOTE — Progress Notes (Signed)
  Chronic Care Management   Outreach Note  01/25/2020 Name: Cassandra Fox MRN: [id number] DOB: 1955-09-03  Referred by: Abigail Miyamoto, MD Reason for referral : Chronic Care Management   An unsuccessful telephone outreach was attempted today. The patient was referred to the pharmacist for assistance with care management and care coordination.   Follow Up Plan:   Aggie Hacker  Upstream Scheduler

## 2020-02-02 ENCOUNTER — Telehealth: Payer: Self-pay | Admitting: Legal Medicine

## 2020-02-02 NOTE — Progress Notes (Signed)
°  Chronic Care Management   Outreach Note  02/02/2020 Name: Cassandra Fox MRN: [id number] DOB: [birth year]  Referred by: Lillard Anes, MD Reason for referral : Chronic Care Management   A second unsuccessful telephone outreach was attempted today. The patient was referred to pharmacist for assistance with care management and care coordination.  Follow Up Plan:   Hilario Quarry  Upstream Scheduler

## 2020-02-24 ENCOUNTER — Telehealth: Payer: Self-pay | Admitting: Legal Medicine

## 2020-02-24 NOTE — Progress Notes (Signed)
°  Chronic Care Management   Outreach Note  02/24/2020 Name: Cassandra Fox MRN: [id number] DOB: 1955/09/19  Referred by: Lillard Anes, MD Reason for referral : Chronic Care Management   Third unsuccessful telephone outreach was attempted today. The patient was referred to the pharmacist for assistance with care management and care coordination.   Follow Up Plan:   Cassandra Fox  Upstream Scheduler

## 2020-03-04 ENCOUNTER — Other Ambulatory Visit: Payer: Self-pay | Admitting: Legal Medicine

## 2020-03-04 DIAGNOSIS — F330 Major depressive disorder, recurrent, mild: Secondary | ICD-10-CM

## 2020-03-23 ENCOUNTER — Other Ambulatory Visit: Payer: Self-pay | Admitting: Legal Medicine

## 2020-03-23 ENCOUNTER — Ambulatory Visit: Payer: PPO | Admitting: Legal Medicine

## 2020-03-30 ENCOUNTER — Encounter: Payer: Self-pay | Admitting: Legal Medicine

## 2020-03-30 ENCOUNTER — Ambulatory Visit (INDEPENDENT_AMBULATORY_CARE_PROVIDER_SITE_OTHER): Payer: PPO | Admitting: Legal Medicine

## 2020-03-30 ENCOUNTER — Other Ambulatory Visit: Payer: Self-pay

## 2020-03-30 VITALS — BP 118/60 | HR 73 | Temp 97.8°F | Resp 18 | Ht 65.0 in | Wt 159.4 lb

## 2020-03-30 DIAGNOSIS — I10 Essential (primary) hypertension: Secondary | ICD-10-CM | POA: Diagnosis not present

## 2020-03-30 DIAGNOSIS — I152 Hypertension secondary to endocrine disorders: Secondary | ICD-10-CM

## 2020-03-30 DIAGNOSIS — E669 Obesity, unspecified: Secondary | ICD-10-CM

## 2020-03-30 DIAGNOSIS — E1169 Type 2 diabetes mellitus with other specified complication: Secondary | ICD-10-CM | POA: Diagnosis not present

## 2020-03-30 DIAGNOSIS — E1159 Type 2 diabetes mellitus with other circulatory complications: Secondary | ICD-10-CM

## 2020-03-30 DIAGNOSIS — F330 Major depressive disorder, recurrent, mild: Secondary | ICD-10-CM

## 2020-03-30 DIAGNOSIS — E039 Hypothyroidism, unspecified: Secondary | ICD-10-CM | POA: Diagnosis not present

## 2020-03-30 DIAGNOSIS — F419 Anxiety disorder, unspecified: Secondary | ICD-10-CM | POA: Insufficient documentation

## 2020-03-30 DIAGNOSIS — E782 Mixed hyperlipidemia: Secondary | ICD-10-CM

## 2020-03-30 NOTE — Progress Notes (Signed)
Subjective:  Patient ID: Cassandra Fox, female    DOB: 25-Jul-1955  Age: 65 y.o. MRN: [id number]  Chief Complaint  Patient presents with  . Depression  . Hyperlipidemia  . Hypothyroidism    HPI: chronic visit   This patient has major depression for 1 year.  PHQ9 =4.  Patient is having less anhedonia.  The patient has improved future plans and prospects.  The depression is worse with stress.  The patient is  exercising and working on behavior to improve mental health.  Patient is not seeing a therapist or psychiatrist.  na  Patient is on citalopram  Patient presents with hyperlipidemia.  Compliance with treatment has been good; patient takes medicines as directed, maintains low cholesterol diet, follows up as directed, and maintains exercise regimen.  Patient is using simvastatin without problems..  Patient has HYPOTHYROIDISM.  Diagnosed 10 years ago.  Patient has stable thyroid readings.  Patient is having no symptoms.  Last TSH was normal.  continue dosage of thyroid medicine. Current Outpatient Medications on File Prior to Visit  Medication Sig Dispense Refill  . ALPRAZolam (XANAX) 0.5 MG tablet TAKE 1 TABLET(0.5 MG) BY MOUTH AT BEDTIME AS NEEDED FOR ANXIETY 30 tablet 3  . aspirin EC 81 MG tablet Take 81 mg by mouth daily.    . citalopram (CELEXA) 20 MG tablet TAKE 1 TABLET(20 MG) BY MOUTH DAILY 30 tablet 6  . glucose blood (ONETOUCH VERIO) test strip USE TO CHECK BLOOD SUGAR TWICE DAILY 100 strip 4  . levothyroxine (SYNTHROID) 75 MCG tablet TAKE 1 TABLET(75 MCG) BY MOUTH EVERY DAY 30 tablet 6  . losartan-hydrochlorothiazide (HYZAAR) 100-25 MG tablet TAKE 1 TABLET BY MOUTH DAILY 90 tablet 2  . metFORMIN (GLUCOPHAGE) 500 MG tablet TAKE 1 TABLET BY MOUTH TWICE DAILY. 180 tablet 2  . PNEUMOVAX 23 25 MCG/0.5ML injection     . simvastatin (ZOCOR) 20 MG tablet TAKE 1 TABLET BY MOUTH AT BEDTIME 90 tablet 2  . triamcinolone (NASACORT) 55 MCG/ACT AERO nasal inhaler 1 puff in each nostril     . Vitamin D, Cholecalciferol, 1000 units CAPS Take by mouth.    . Zinc Sulfate (ZINC 15 PO) Take by mouth.     No current facility-administered medications on file prior to visit.   Past Medical History:  Diagnosis Date  . Anxiety   . Breast mass, right   . Depression   . Diabetes mellitus without complication ([place])   . Hyperlipidemia   . Hypertension   . Hypothyroidism    Past Surgical History:  Procedure Laterality Date  . BREAST EXCISIONAL BIOPSY Right    benign  . BREAST LUMPECTOMY WITH RADIOACTIVE SEED LOCALIZATION Right 09/23/2015   Procedure: RIGHT BREAST LUMPECTOMY WITH RADIOACTIVE SEED LOCALIZATION;  Surgeon: Excell Seltzer, MD;  Location: [facility];  Service: General;  Laterality: Right;    Family History  Problem Relation Age of Onset  . Hyperlipidemia Mother   . Diabetes Mother   . Hyperlipidemia Father   . Hypertension Father    Social History   Socioeconomic History  . Marital status: Married    Spouse name: Not on file  . Number of children: 3  . Years of education: Not on file  . Highest education level: Not on file  Occupational History  . Not on file  Tobacco Use  . Smoking status: Current Every Day Smoker    Packs/day: 0.50    Types: Cigarettes  . Smokeless tobacco: Never Used  Vaping  Use  . Vaping Use: Never used  Substance and Sexual Activity  . Alcohol use: Not Currently    Comment: social  . Drug use: No  . Sexual activity: Not Currently  Other Topics Concern  . Not on file  Social History Narrative  . Not on file   Social Determinants of Health   Financial Resource Strain: Not on file  Food Insecurity: Not on file  Transportation Needs: Not on file  Physical Activity: Not on file  Stress: Not on file  Social Connections: Not on file    Review of Systems  Constitutional: Negative for activity change, diaphoresis and unexpected weight change.  HENT: Negative for congestion, rhinorrhea and sinus pain.   Eyes:  Negative for visual disturbance.  Respiratory: Negative for chest tightness and shortness of breath.   Cardiovascular: Negative for chest pain, palpitations and leg swelling.  Gastrointestinal: Negative for abdominal distention and abdominal pain.  Endocrine: Negative for polyuria.  Genitourinary: Negative for difficulty urinating, dysuria and urgency.  Musculoskeletal: Negative for arthralgias and back pain.  Skin: Negative.   Neurological: Negative.   Psychiatric/Behavioral: Negative.      Objective:  BP 118/60 (BP Location: Right Arm, Patient Position: Sitting, Cuff Size: Normal)   Pulse 73   Temp 97.8 F (36.6 C) (Temporal)   Resp 18   Ht 5\' 5"  (1.651 m)   Wt 159 lb 6.4 oz (72.3 kg)   SpO2 97%   BMI 26.53 kg/m   BP/Weight 03/30/2020 01/12/2020 24/82/5003  Systolic BP 704 888 916  Diastolic BP 60 64 60  Wt. (Lbs) 159.4 160.2 157  BMI 26.53 26.66 26.13    Physical Exam Vitals reviewed.  Constitutional:      Appearance: Normal appearance.  HENT:     Head: Normocephalic.     Right Ear: Tympanic membrane, ear canal and external ear normal.     Left Ear: Tympanic membrane, ear canal and external ear normal.     Nose: Nose normal.     Mouth/Throat:     Mouth: Mucous membranes are moist.     Pharynx: Oropharynx is clear.  Eyes:     Extraocular Movements: Extraocular movements intact.     Conjunctiva/sclera: Conjunctivae normal.     Pupils: Pupils are equal, round, and reactive to light.  Cardiovascular:     Rate and Rhythm: Normal rate and regular rhythm.     Pulses: Normal pulses.     Heart sounds: Normal heart sounds. No murmur heard. No gallop.   Pulmonary:     Effort: Pulmonary effort is normal. No respiratory distress.     Breath sounds: Normal breath sounds. No rales.  Abdominal:     General: Abdomen is flat. Bowel sounds are normal. There is no distension.     Palpations: Abdomen is soft.     Tenderness: There is no abdominal tenderness.  Musculoskeletal:         General: No tenderness. Normal range of motion.     Cervical back: Normal range of motion and neck supple.  Skin:    General: Skin is warm and dry.     Capillary Refill: Capillary refill takes less than 2 seconds.  Neurological:     General: No focal deficit present.     Mental Status: She is alert and oriented to person, place, and time.  Psychiatric:        Mood and Affect: Mood normal.        Thought Content: Thought content normal.  Judgment: Judgment normal.      Depression screen [facility] 2/9 03/30/2020 03/30/2020 11/20/2019 07/17/2019  Decreased Interest 1 1 0 0  Down, Depressed, Hopeless 0 0 1 1  PHQ - 2 Score 1 1 1 1   Altered sleeping 3 - 1 1  Tired, decreased energy 0 - 1 1  Change in appetite 0 - 0 0  Feeling bad or failure about yourself  0 - 0 0  Trouble concentrating 0 - 0 0  Moving slowly or fidgety/restless 0 - 0 0  Suicidal thoughts 0 - 0 0  PHQ-9 Score 4 - 3 3  Difficult doing work/chores Not difficult at all - Not difficult at all Not difficult at all    Lab Results  Component Value Date   WBC 11.0 (H) 11/20/2019   HGB 12.5 11/20/2019   HCT 37.0 11/20/2019   PLT 257 11/20/2019   GLUCOSE 98 11/20/2019   CHOL 154 11/20/2019   TRIG 178 (H) 11/20/2019   HDL 40 11/20/2019   LDLCALC 84 11/20/2019   ALT 18 11/20/2019   AST 18 11/20/2019   NA 138 11/20/2019   K 4.1 11/20/2019   CL 99 11/20/2019   CREATININE 0.89 11/20/2019   BUN 12 11/20/2019   CO2 25 11/20/2019   TSH 4.380 07/17/2019   HGBA1C 6.4 (H) 11/20/2019   MICROALBUR 30 03/19/2019      Assessment & Plan:  Diagnoses and all orders for this visit: Major depressive disorder, recurrent episode, mild ([place]) Patient's depression is controlled with citalopram.   Anhedonia better.  PHQ 9 was performed score 4. An individual care plan was established or reinforced today.  The patient's disease status was assessed using clinical findings on exam, labs, and or other diagnostic testing to determine  patient's success in meeting treatment goals based on disease specific evidence-based guidelines and found to be improving Recommendations include stay on medicines  Mixed hyperlipidemia -     Lipid panel AN INDIVIDUAL CARE PLAN for hyperlipidemia/ cholesterol was established and reinforced today.  The patient's status was assessed using clinical findings on exam, lab and other diagnostic tests. The patient's disease status was assessed based on evidence-based guidelines and found to be well controlled. MEDICATIONS were reviewed. SELF MANAGEMENT GOALS have been discussed and patient's success at attaining the goal of low cholesterol was assessed. RECOMMENDATION given include regular exercise 3 days a week and low cholesterol/low fat diet. CLINICAL SUMMARY including written plan to identify barriers unique to the patient due to social or economic  reasons was discussed.  Hypothyroidism, adult -     TSH Patient is known to have hypothyroidism and is n treatment levothyroxine 5mcgt.  Patient was diagnosed 10 years ago.  Other treatment includes none.  Patient is compliant with medicines and last TSH 6 months ago.  Last TSH was normal . Benign hypertension -     CBC with Differential/Platelet -     Comprehensive metabolic panel An individual hypertension care plan was established and reinforced today.  The patient's status was assessed using clinical findings on exam and labs or diagnostic tests. The patient's success at meeting treatment goals on disease specific evidence-based guidelines and found to be fair controlled. SELF MANAGEMENT: The patient and I together assessed ways to personally work towards obtaining the recommended goals. RECOMMENDATIONS: avoid decongestants found in common cold remedies, decrease consumption of alcohol, perform routine monitoring of BP with home BP cuff, exercise, reduction of dietary salt, take medicines as prescribed, try not to miss  doses and quit smoking.   Regular exercise and maintaining a healthy weight is needed.  Stress reduction may help. A CLINICAL SUMMARY including written plan identify barriers to care unique to individual due to social or financial issues.  We attempt to mutually creat solutions for individual and family understanding.  Obesity, diabetes, and hypertension syndrome ([place]) -     Hemoglobin A1c An individual care plan for diabetes was established and reinforced today.  The patient's status was assessed using clinical findings on exam, labs and diagnostic testing. Patient success at meeting goals based on disease specific evidence-based guidelines and found to be fair controlled. Medications were assessed and patient's understanding of the medical issues , including barriers were assessed. Recommend adherence to a diabetic diet, a graduated exercise program, HgbA1c level is checked quarterly, and urine microalbumin performed yearly .  Annual mono-filament sensation testing performed. Lower blood pressure and control hyperlipidemia is important. Get annual eye exams and annual flu shots and smoking cessation discussed.  Self management goals were discussed.         I spent 35 minutes dedicated to the care of this patient on the date of this encounter to include face-to-face time with the patient, as well as:   Follow-up: Return in about 4 months (around 07/30/2020) for fasting.  An After Visit Summary was printed and given to the patient.  Reinaldo Meeker, MD [facility] [phone]

## 2020-03-31 LAB — COMPREHENSIVE METABOLIC PANEL
ALT: 37 IU/L — ABNORMAL HIGH (ref 0–32)
AST: 26 IU/L (ref 0–40)
Albumin/Globulin Ratio: 1.5 (ref 1.2–2.2)
Albumin: 4.3 g/dL (ref 3.8–4.8)
Alkaline Phosphatase: 88 IU/L (ref 44–121)
BUN/Creatinine Ratio: 13 (ref 12–28)
BUN: 12 mg/dL (ref 8–27)
Bilirubin Total: 0.3 mg/dL (ref 0.0–1.2)
CO2: 23 mmol/L (ref 20–29)
Calcium: 9.3 mg/dL (ref 8.7–10.3)
Chloride: 95 mmol/L — ABNORMAL LOW (ref 96–106)
Creatinine, Ser: 0.89 mg/dL (ref 0.57–1.00)
Globulin, Total: 2.9 g/dL (ref 1.5–4.5)
Glucose: 107 mg/dL — ABNORMAL HIGH (ref 65–99)
Potassium: 4 mmol/L (ref 3.5–5.2)
Sodium: 133 mmol/L — ABNORMAL LOW (ref 134–144)
Total Protein: 7.2 g/dL (ref 6.0–8.5)
eGFR: 71 mL/min/{1.73_m2} (ref >59)

## 2020-03-31 LAB — CBC WITH DIFFERENTIAL/PLATELET
Basophils Absolute: 0.1 10*3/uL (ref 0.0–0.2)
Basos: 2 %
EOS (ABSOLUTE): 0.5 10*3/uL — ABNORMAL HIGH (ref 0.0–0.4)
Eos: 5 %
Hematocrit: 38.6 % (ref 34.0–46.6)
Hemoglobin: 13 g/dL (ref 11.1–15.9)
Immature Grans (Abs): 0.1 10*3/uL (ref 0.0–0.1)
Immature Granulocytes: 1 %
Lymphocytes Absolute: 1.9 10*3/uL (ref 0.7–3.1)
Lymphs: 21 %
MCH: 29 pg (ref 26.6–33.0)
MCHC: 33.7 g/dL (ref 31.5–35.7)
MCV: 86 fL (ref 79–97)
Monocytes Absolute: 1.2 10*3/uL — ABNORMAL HIGH (ref 0.1–0.9)
Monocytes: 13 %
Neutrophils Absolute: 5.4 10*3/uL (ref 1.4–7.0)
Neutrophils: 58 %
Platelets: 211 10*3/uL (ref 150–450)
RBC: 4.48 x10E6/uL (ref 3.77–5.28)
RDW: 12.8 % (ref 11.7–15.4)
WBC: 9.2 10*3/uL (ref 3.4–10.8)

## 2020-03-31 LAB — LIPID PANEL
Chol/HDL Ratio: 2.9 ratio (ref 0.0–4.4)
Cholesterol, Total: 131 mg/dL (ref 100–199)
HDL: 45 mg/dL (ref >39)
LDL Chol Calc (NIH): 64 mg/dL (ref 0–99)
Triglycerides: 123 mg/dL (ref 0–149)
VLDL Cholesterol Cal: 22 mg/dL (ref 5–40)

## 2020-03-31 LAB — TSH: TSH: 3.66 u[IU]/mL (ref 0.450–4.500)

## 2020-03-31 LAB — HEMOGLOBIN A1C
Est. average glucose Bld gHb Est-mCnc: 126 mg/dL
Hgb A1c MFr Bld: 6 % — ABNORMAL HIGH (ref 4.8–5.6)

## 2020-03-31 LAB — CARDIOVASCULAR RISK ASSESSMENT

## 2020-03-31 NOTE — Progress Notes (Signed)
CBC normal, glucose , 107, kidney tests OK, one liver tests slightly up, A1c 6.0, Triglycerides high- watch diet, TSH 3.66 normal lp

## 2020-04-12 DIAGNOSIS — D2272 Melanocytic nevi of left lower limb, including hip: Secondary | ICD-10-CM | POA: Diagnosis not present

## 2020-04-12 DIAGNOSIS — L718 Other rosacea: Secondary | ICD-10-CM | POA: Diagnosis not present

## 2020-04-12 DIAGNOSIS — L821 Other seborrheic keratosis: Secondary | ICD-10-CM | POA: Diagnosis not present

## 2020-04-12 DIAGNOSIS — D1801 Hemangioma of skin and subcutaneous tissue: Secondary | ICD-10-CM | POA: Diagnosis not present

## 2020-04-12 DIAGNOSIS — Z85828 Personal history of other malignant neoplasm of skin: Secondary | ICD-10-CM | POA: Diagnosis not present

## 2020-04-12 DIAGNOSIS — D2361 Other benign neoplasm of skin of right upper limb, including shoulder: Secondary | ICD-10-CM | POA: Diagnosis not present

## 2020-04-12 DIAGNOSIS — L820 Inflamed seborrheic keratosis: Secondary | ICD-10-CM | POA: Diagnosis not present

## 2020-04-12 DIAGNOSIS — L570 Actinic keratosis: Secondary | ICD-10-CM | POA: Diagnosis not present

## 2020-04-12 DIAGNOSIS — L814 Other melanin hyperpigmentation: Secondary | ICD-10-CM | POA: Diagnosis not present

## 2020-05-09 DIAGNOSIS — H35712 Central serous chorioretinopathy, left eye: Secondary | ICD-10-CM | POA: Diagnosis not present

## 2020-05-09 DIAGNOSIS — H35033 Hypertensive retinopathy, bilateral: Secondary | ICD-10-CM | POA: Diagnosis not present

## 2020-05-09 DIAGNOSIS — H2513 Age-related nuclear cataract, bilateral: Secondary | ICD-10-CM | POA: Diagnosis not present

## 2020-05-09 DIAGNOSIS — H353132 Nonexudative age-related macular degeneration, bilateral, intermediate dry stage: Secondary | ICD-10-CM | POA: Diagnosis not present

## 2020-05-18 ENCOUNTER — Other Ambulatory Visit: Payer: Self-pay | Admitting: Obstetrics & Gynecology

## 2020-05-24 DIAGNOSIS — H35712 Central serous chorioretinopathy, left eye: Secondary | ICD-10-CM | POA: Diagnosis not present

## 2020-05-24 DIAGNOSIS — H353132 Nonexudative age-related macular degeneration, bilateral, intermediate dry stage: Secondary | ICD-10-CM | POA: Diagnosis not present

## 2020-05-30 ENCOUNTER — Other Ambulatory Visit: Payer: Self-pay | Admitting: Legal Medicine

## 2020-05-30 DIAGNOSIS — Z1231 Encounter for screening mammogram for malignant neoplasm of breast: Secondary | ICD-10-CM

## 2020-05-31 ENCOUNTER — Other Ambulatory Visit: Payer: Self-pay

## 2020-05-31 ENCOUNTER — Ambulatory Visit
Admission: RE | Admit: 2020-05-31 | Discharge: 2020-05-31 | Disposition: A | Discharge status: Home / Self Care | Payer: PPO | Source: Ambulatory Visit | Attending: Legal Medicine | Admitting: Legal Medicine

## 2020-05-31 DIAGNOSIS — Z1231 Encounter for screening mammogram for malignant neoplasm of breast: Secondary | ICD-10-CM

## 2020-05-31 MED ORDER — CITALOPRAM HYDROBROMIDE 20 MG PO TABS: ORAL_TABLET | ORAL | 1 refills | Status: DC

## 2020-05-31 MED ORDER — LEVOTHYROXINE SODIUM 75 MCG PO TABS: ORAL_TABLET | ORAL | 1 refills | Status: DC

## 2020-06-01 ENCOUNTER — Other Ambulatory Visit: Payer: Self-pay | Admitting: Legal Medicine

## 2020-06-01 ENCOUNTER — Other Ambulatory Visit: Payer: Self-pay

## 2020-06-01 DIAGNOSIS — R928 Other abnormal and inconclusive findings on diagnostic imaging of breast: Secondary | ICD-10-CM

## 2020-06-18 DIAGNOSIS — Z7689 Persons encountering health services in other specified circumstances: Secondary | ICD-10-CM | POA: Diagnosis not present

## 2020-06-18 DIAGNOSIS — A932 Colorado tick fever: Secondary | ICD-10-CM | POA: Diagnosis not present

## 2020-06-18 DIAGNOSIS — M25562 Pain in left knee: Secondary | ICD-10-CM | POA: Diagnosis not present

## 2020-06-23 ENCOUNTER — Ambulatory Visit
Admission: RE | Admit: 2020-06-23 | Discharge: 2020-06-23 | Disposition: A | Discharge status: Home / Self Care | Payer: PPO | Source: Ambulatory Visit | Attending: Legal Medicine | Admitting: Legal Medicine

## 2020-06-23 ENCOUNTER — Other Ambulatory Visit: Payer: Self-pay

## 2020-06-23 ENCOUNTER — Ambulatory Visit: Payer: PPO

## 2020-06-23 DIAGNOSIS — R922 Inconclusive mammogram: Secondary | ICD-10-CM | POA: Diagnosis not present

## 2020-06-23 DIAGNOSIS — R928 Other abnormal and inconclusive findings on diagnostic imaging of breast: Secondary | ICD-10-CM

## 2020-06-23 NOTE — Progress Notes (Signed)
Diagnostic mammogram  is normal, no cancer, just some fibroglandular tissue lp

## 2020-07-08 ENCOUNTER — Other Ambulatory Visit: Payer: Self-pay

## 2020-07-08 ENCOUNTER — Encounter: Payer: Self-pay | Admitting: Legal Medicine

## 2020-07-08 ENCOUNTER — Ambulatory Visit (INDEPENDENT_AMBULATORY_CARE_PROVIDER_SITE_OTHER): Payer: PPO | Admitting: Legal Medicine

## 2020-07-08 VITALS — BP 140/80 | HR 73 | Temp 97.7°F | Resp 16 | Ht 65.0 in | Wt 158.0 lb

## 2020-07-08 DIAGNOSIS — E669 Obesity, unspecified: Secondary | ICD-10-CM | POA: Diagnosis not present

## 2020-07-08 DIAGNOSIS — E1159 Type 2 diabetes mellitus with other circulatory complications: Secondary | ICD-10-CM | POA: Diagnosis not present

## 2020-07-08 DIAGNOSIS — A770 Spotted fever due to Rickettsia rickettsii: Secondary | ICD-10-CM | POA: Diagnosis not present

## 2020-07-08 DIAGNOSIS — F330 Major depressive disorder, recurrent, mild: Secondary | ICD-10-CM

## 2020-07-08 DIAGNOSIS — I152 Hypertension secondary to endocrine disorders: Secondary | ICD-10-CM

## 2020-07-08 DIAGNOSIS — E1169 Type 2 diabetes mellitus with other specified complication: Secondary | ICD-10-CM

## 2020-07-08 MED ORDER — OZEMPIC (0.25 OR 0.5 MG/DOSE) 2 MG/1.5ML ~~LOC~~ SOPN: 0.5000 mg | PEN_INJECTOR | SUBCUTANEOUS | 3 refills | Status: DC

## 2020-07-08 MED ORDER — CITALOPRAM HYDROBROMIDE 40 MG PO TABS: ORAL_TABLET | ORAL | 3 refills | Status: DC

## 2020-07-08 NOTE — Progress Notes (Signed)
Established Patient Office Visit  Subjective:  Patient ID: Cassandra Fox, female    DOB: November 12, 1955  Age: 65 y.o. MRN: [id number]  CC:  Chief Complaint  Patient presents with   [facility] mountain fever follow up    HPI ODDIE KUHLMANN presents for patient had tick bite on left knee and urgent care diagnosed RMSF with positive titers.  She took all her doxycycline and feels fine.  Past Medical History:  Diagnosis Date   Anxiety    Breast mass, right    Depression    Diabetes mellitus without complication ([place])    Hyperlipidemia    Hypertension    Hypothyroidism     Past Surgical History:  Procedure Laterality Date   BREAST EXCISIONAL BIOPSY Right    benign   BREAST LUMPECTOMY WITH RADIOACTIVE SEED LOCALIZATION Right 09/23/2015   Procedure: RIGHT BREAST LUMPECTOMY WITH RADIOACTIVE SEED LOCALIZATION;  Surgeon: Excell Seltzer, MD;  Location: [place];  Service: General;  Laterality: Right;    Family History  Problem Relation Age of Onset   Hyperlipidemia Mother    Diabetes Mother    Hyperlipidemia Father    Hypertension Father     Social History   Socioeconomic History   Marital status: Married    Spouse name: Not on file   Number of children: 3   Years of education: Not on file   Highest education level: Not on file  Occupational History   Not on file  Tobacco Use   Smoking status: Every Day    Packs/day: 0.50    Pack years: 0.00    Types: Cigarettes   Smokeless tobacco: Never  Vaping Use   Vaping Use: Never used  Substance and Sexual Activity   Alcohol use: Not Currently    Comment: social   Drug use: No   Sexual activity: Not Currently  Other Topics Concern   Not on file  Social History Narrative   Not on file   Social Determinants of Health   Financial Resource Strain: Not on file  Food Insecurity: Not on file  Transportation Needs: Not on file  Physical Activity: Not on file  Stress: Not on file  Social Connections:  Not on file  Intimate Partner Violence: Not on file    Outpatient Medications Prior to Visit  Medication Sig Dispense Refill   ALPRAZolam (XANAX) 0.5 MG tablet TAKE 1 TABLET(0.5 MG) BY MOUTH AT BEDTIME AS NEEDED FOR ANXIETY 30 tablet 3   aspirin EC 81 MG tablet Take 81 mg by mouth daily.     glucose blood (ONETOUCH VERIO) test strip USE TO CHECK BLOOD SUGAR TWICE DAILY 100 strip 4   levothyroxine (SYNTHROID) 75 MCG tablet TAKE 1 TABLET(75 MCG) BY MOUTH EVERY DAY 90 tablet 1   losartan-hydrochlorothiazide (HYZAAR) 100-25 MG tablet TAKE 1 TABLET BY MOUTH DAILY 90 tablet 2   metFORMIN (GLUCOPHAGE) 500 MG tablet TAKE 1 TABLET BY MOUTH TWICE DAILY. 180 tablet 2   PNEUMOVAX 23 25 MCG/0.5ML injection      simvastatin (ZOCOR) 20 MG tablet TAKE 1 TABLET BY MOUTH AT BEDTIME 90 tablet 2   triamcinolone (NASACORT) 55 MCG/ACT AERO nasal inhaler 1 puff in each nostril     Vitamin D, Cholecalciferol, 1000 units CAPS Take by mouth.     Zinc Sulfate (ZINC 15 PO) Take by mouth.     citalopram (CELEXA) 20 MG tablet TAKE 1 TABLET(20 MG) BY MOUTH DAILY 90 tablet 1   No facility-administered medications prior  to visit.    Allergies  Allergen Reactions   Codeine Nausea Only    ROS Review of Systems  Constitutional:  Negative for activity change and appetite change.  HENT:  Negative for congestion.   Eyes:  Negative for visual disturbance.  Respiratory:  Negative for chest tightness and shortness of breath.   Cardiovascular:  Negative for chest pain, palpitations and leg swelling.  Gastrointestinal:  Negative for abdominal distention and abdominal pain.  Genitourinary: Negative.   Musculoskeletal:  Negative for arthralgias and back pain.  Skin: Negative.   Neurological: Negative.   Psychiatric/Behavioral: Negative.       Objective:    Physical Exam  BP 140/80   Pulse 73   Temp 97.7 F (36.5 C)   Resp 16   Ht 5' 5"  (1.651 m)   Wt 158 lb (71.7 kg)   SpO2 95%   BMI 26.29 kg/m  Wt  Readings from Last 3 Encounters:  07/08/20 158 lb (71.7 kg)  03/30/20 159 lb 6.4 oz (72.3 kg)  01/12/20 160 lb 3.2 oz (72.7 kg)   Depression screen [facility] 2/9 03/30/2020 03/30/2020 11/20/2019 07/17/2019  Decreased Interest 1 1 0 0  Down, Depressed, Hopeless 0 0 1 1  PHQ - 2 Score 1 1 1 1   Altered sleeping 3 - 1 1  Tired, decreased energy 0 - 1 1  Change in appetite 0 - 0 0  Feeling bad or failure about yourself  0 - 0 0  Trouble concentrating 0 - 0 0  Moving slowly or fidgety/restless 0 - 0 0  Suicidal thoughts 0 - 0 0  PHQ-9 Score 4 - 3 3  Difficult doing work/chores Not difficult at all - Not difficult at all Not difficult at all      Health Maintenance Due  Topic Date Due   Hepatitis C Screening  Never done   TETANUS/TDAP  Never done   COLONOSCOPY (Pts 45-28yr Insurance coverage will need to be confirmed)  Never done   Zoster Vaccines- Shingrix (1 of 2) Never done   FOOT EXAM  03/18/2020   COVID-19 Vaccine (4 - Booster for Moderna series) 03/23/2020   OPHTHALMOLOGY EXAM  03/31/2020    There are no preventive care reminders to display for this patient.  Lab Results  Component Value Date   TSH 3.660 03/30/2020   Lab Results  Component Value Date   WBC 9.2 03/30/2020   HGB 13.0 03/30/2020   HCT 38.6 03/30/2020   MCV 86 03/30/2020   PLT 211 03/30/2020   Lab Results  Component Value Date   NA 133 (L) 03/30/2020   K 4.0 03/30/2020   CO2 23 03/30/2020   GLUCOSE 107 (H) 03/30/2020   BUN 12 03/30/2020   CREATININE 0.89 03/30/2020   BILITOT 0.3 03/30/2020   ALKPHOS 88 03/30/2020   AST 26 03/30/2020   ALT 37 (H) 03/30/2020   PROT 7.2 03/30/2020   ALBUMIN 4.3 03/30/2020   CALCIUM 9.3 03/30/2020   ANIONGAP 9 09/22/2015   EGFR 71 03/30/2020   Lab Results  Component Value Date   CHOL 131 03/30/2020   Lab Results  Component Value Date   HDL 45 03/30/2020   Lab Results  Component Value Date   LDLCALC 64 03/30/2020   Lab Results  Component Value Date   TRIG  123 03/30/2020   Lab Results  Component Value Date   CHOLHDL 2.9 03/30/2020   Lab Results  Component Value Date   HGBA1C 6.0 (H) 03/30/2020  Assessment & Plan:   Problem List Items Addressed This Visit       Other   Major depressive disorder, recurrent episode, mild (HCC) - Primary   Relevant Medications   citalopram (CELEXA) 40 MG tablet Depression is stable   Obesity, diabetes, and hypertension syndrome (HCC) -     Semaglutide,0.25 or 0.5MG/DOS, (OZEMPIC, 0.25 OR 0.5 MG/DOSE,) 2 MG/1.5ML SOPN; Inject 0.5 mg into the skin once a week. An individual care plan for diabetes was established and reinforced today.  The patient's status was assessed using clinical findings on exam, labs and diagnostic testing. Patient success at meeting goals based on disease specific evidence-based guidelines and found to be good controlled. Medications were assessed and patient's understanding of the medical issues , including barriers were assessed. Recommend adherence to a diabetic diet, a graduated exercise program, HgbA1c level is checked quarterly, and urine microalbumin performed yearly .  Annual mono-filament sensation testing performed. Lower blood pressure and control hyperlipidemia is important. Get annual eye exams and annual flu shots and smoking cessation discussed.  Self management goals were discussed.   RMSF [facility] spotted fever)  RMSF diagnosed 20 days ago and patient on doxycycline, no symptoms, no rashes  Meds ordered this encounter  Medications   citalopram (CELEXA) 40 MG tablet    Sig: TAKE 1 TABLET(20 MG) BY MOUTH DAILY    Dispense:  30 tablet    Refill:  3    ZERO refills remain on this prescription. Your patient is requesting advance approval of refills for this medication to [place]    Follow-up: Return if symptoms worsen or fail to improve.    Reinaldo Meeker, MD

## 2020-07-10 DIAGNOSIS — A770 Spotted fever due to Rickettsia rickettsii: Secondary | ICD-10-CM | POA: Insufficient documentation

## 2020-07-10 HISTORY — DX: Spotted fever due to Rickettsia rickettsii: A77.0

## 2020-07-11 ENCOUNTER — Other Ambulatory Visit: Payer: Self-pay

## 2020-07-11 DIAGNOSIS — F330 Major depressive disorder, recurrent, mild: Secondary | ICD-10-CM

## 2020-07-11 MED ORDER — CITALOPRAM HYDROBROMIDE 40 MG PO TABS: 40.0000 mg | ORAL_TABLET | Freq: Every day | ORAL | 3 refills | Status: DC

## 2020-07-12 DIAGNOSIS — H35712 Central serous chorioretinopathy, left eye: Secondary | ICD-10-CM | POA: Diagnosis not present

## 2020-07-12 DIAGNOSIS — H353132 Nonexudative age-related macular degeneration, bilateral, intermediate dry stage: Secondary | ICD-10-CM | POA: Diagnosis not present

## 2020-07-12 DIAGNOSIS — H35033 Hypertensive retinopathy, bilateral: Secondary | ICD-10-CM | POA: Diagnosis not present

## 2020-07-12 DIAGNOSIS — H2513 Age-related nuclear cataract, bilateral: Secondary | ICD-10-CM | POA: Diagnosis not present

## 2020-07-13 ENCOUNTER — Other Ambulatory Visit: Payer: Self-pay | Admitting: Legal Medicine

## 2020-08-01 ENCOUNTER — Other Ambulatory Visit: Payer: Self-pay | Admitting: Legal Medicine

## 2020-08-01 DIAGNOSIS — F330 Major depressive disorder, recurrent, mild: Secondary | ICD-10-CM

## 2020-08-03 ENCOUNTER — Ambulatory Visit (INDEPENDENT_AMBULATORY_CARE_PROVIDER_SITE_OTHER): Payer: PPO | Admitting: Legal Medicine

## 2020-08-03 ENCOUNTER — Other Ambulatory Visit: Payer: Self-pay

## 2020-08-03 ENCOUNTER — Encounter: Payer: Self-pay | Admitting: Legal Medicine

## 2020-08-03 VITALS — BP 135/70 | HR 62 | Temp 97.8°F | Resp 16 | Ht 65.0 in | Wt 154.0 lb

## 2020-08-03 DIAGNOSIS — E669 Obesity, unspecified: Secondary | ICD-10-CM

## 2020-08-03 DIAGNOSIS — Z6826 Body mass index (BMI) 26.0-26.9, adult: Secondary | ICD-10-CM

## 2020-08-03 DIAGNOSIS — I10 Essential (primary) hypertension: Secondary | ICD-10-CM | POA: Diagnosis not present

## 2020-08-03 DIAGNOSIS — E782 Mixed hyperlipidemia: Secondary | ICD-10-CM | POA: Diagnosis not present

## 2020-08-03 DIAGNOSIS — E039 Hypothyroidism, unspecified: Secondary | ICD-10-CM

## 2020-08-03 DIAGNOSIS — F330 Major depressive disorder, recurrent, mild: Secondary | ICD-10-CM | POA: Diagnosis not present

## 2020-08-03 DIAGNOSIS — L509 Urticaria, unspecified: Secondary | ICD-10-CM | POA: Diagnosis not present

## 2020-08-03 DIAGNOSIS — E1159 Type 2 diabetes mellitus with other circulatory complications: Secondary | ICD-10-CM

## 2020-08-03 DIAGNOSIS — E1169 Type 2 diabetes mellitus with other specified complication: Secondary | ICD-10-CM | POA: Diagnosis not present

## 2020-08-03 DIAGNOSIS — I152 Hypertension secondary to endocrine disorders: Secondary | ICD-10-CM

## 2020-08-03 DIAGNOSIS — Z6825 Body mass index (BMI) 25.0-25.9, adult: Secondary | ICD-10-CM | POA: Insufficient documentation

## 2020-08-03 HISTORY — DX: Body mass index (BMI) 26.0-26.9, adult: Z68.26

## 2020-08-03 MED ORDER — CLOBETASOL PROPIONATE 0.05 % EX CREA: 1.0000 "application " | TOPICAL_CREAM | Freq: Two times a day (BID) | CUTANEOUS | 0 refills | Status: DC

## 2020-08-03 NOTE — Progress Notes (Signed)
Established Patient Office Visit  Subjective:  Patient ID: Cassandra Fox, female    DOB: [birth year]  Age: 65 y.o. MRN: [id number]  CC:  Chief Complaint  Patient presents with   Hyperlipidemia   Hypertension   Diabetes    HPI Cassandra Fox presents for chronic visit  Patient present with type 2 diabetes.  Specifically, this is type 2, noninsulin requiring diabetes, complicated by hypertneison and hyperlipidemia.  Compliance with treatment has been good; patient take medicines as directed, maintains diet and exercise regimen, follows up as directed, and is keeping glucose diary.  Date of  diagnosis 2010.  Depression screen has been performed.Tobacco screen smoker . Current medicines for diabetes metformin, ozempic.  Patient is on losartan for renal protection and simvastatin for cholesterol control.  Patient performs foot exams daily and last ophthalmologic exam was no.   Patient presents with hyperlipidemia.  Compliance with treatment has been good; patient takes medicines as directed, maintains low cholesterol diet, follows up as directed, and maintains exercise regimen.  Patient is using simvastatin without problems.   Patient presents for follow up of hypertension.  Patient tolerating losartan/HCTZ well with side effects.  Patient was diagnosed with hypertension 2010 so has been treated for hypertension for 10 years.Patient is working on maintaining diet and exercise regimen and follows up as directed. Complication include none.   Past Medical History:  Diagnosis Date   Anxiety    Breast mass, right    Depression    Diabetes mellitus without complication ([place])    Hyperlipidemia    Hypertension    Hypothyroidism     Past Surgical History:  Procedure Laterality Date   BREAST EXCISIONAL BIOPSY Right    benign   BREAST LUMPECTOMY WITH RADIOACTIVE SEED LOCALIZATION Right 09/23/2015   Procedure: RIGHT BREAST LUMPECTOMY WITH RADIOACTIVE SEED LOCALIZATION;  Surgeon: Excell Seltzer, MD;  Location: [place];  Service: General;  Laterality: Right;    Family History  Problem Relation Age of Onset   Hyperlipidemia Mother    Diabetes Mother    Hyperlipidemia Father    Hypertension Father     Social History   Socioeconomic History   Marital status: Married    Spouse name: Not on file   Number of children: 3   Years of education: Not on file   Highest education level: Not on file  Occupational History   Not on file  Tobacco Use   Smoking status: Every Day    Packs/day: 0.50    Pack years: 0.00    Types: Cigarettes   Smokeless tobacco: Never  Vaping Use   Vaping Use: Never used  Substance and Sexual Activity   Alcohol use: Not Currently    Comment: social   Drug use: No   Sexual activity: Not Currently  Other Topics Concern   Not on file  Social History Narrative   Not on file   Social Determinants of Health   Financial Resource Strain: Not on file  Food Insecurity: Not on file  Transportation Needs: Not on file  Physical Activity: Not on file  Stress: Not on file  Social Connections: Not on file  Intimate Partner Violence: Not on file    Outpatient Medications Prior to Visit  Medication Sig Dispense Refill   ALPRAZolam (XANAX) 0.5 MG tablet TAKE 1 TABLET(0.5 MG) BY MOUTH AT BEDTIME AS NEEDED FOR ANXIETY 30 tablet 3   aspirin EC 81 MG tablet Take 81 mg by mouth daily.  citalopram (CELEXA) 40 MG tablet Take 1 tablet (40 mg total) by mouth daily. TAKE 1 TABLET(20 MG) BY MOUTH DAILY 30 tablet 3   glucose blood (ONETOUCH VERIO) test strip USE TO CHECK BLOOD SUGAR TWICE DAILY 100 strip 4   levothyroxine (SYNTHROID) 75 MCG tablet TAKE 1 TABLET(75 MCG) BY MOUTH EVERY DAY 90 tablet 1   losartan-hydrochlorothiazide (HYZAAR) 100-25 MG tablet TAKE 1 TABLET BY MOUTH DAILY 90 tablet 2   metFORMIN (GLUCOPHAGE) 500 MG tablet TAKE 1 TABLET BY MOUTH TWICE DAILY. 180 tablet 2   PNEUMOVAX 23 25 MCG/0.5ML injection       Semaglutide,0.25 or 0.5MG/DOS, (OZEMPIC, 0.25 OR 0.5 MG/DOSE,) 2 MG/1.5ML SOPN Inject 0.5 mg into the skin once a week. 4.5 mL 3   simvastatin (ZOCOR) 20 MG tablet TAKE 1 TABLET BY MOUTH AT BEDTIME 90 tablet 2   triamcinolone (NASACORT) 55 MCG/ACT AERO nasal inhaler 1 puff in each nostril     Vitamin D, Cholecalciferol, 1000 units CAPS Take by mouth.     Zinc Sulfate (ZINC 15 PO) Take by mouth.     No facility-administered medications prior to visit.    Allergies  Allergen Reactions   Codeine Nausea Only    ROS Review of Systems  Constitutional:  Negative for activity change and appetite change.  HENT:  Negative for congestion and ear discharge.   Eyes:  Negative for visual disturbance.  Respiratory:  Negative for chest tightness and shortness of breath.   Gastrointestinal:  Negative for abdominal distention and abdominal pain.  Endocrine: Negative for polyuria.  Genitourinary:  Negative for difficulty urinating and dysuria.  Musculoskeletal:  Negative for arthralgias and back pain.  Skin: Negative.   Neurological: Negative.   Psychiatric/Behavioral: Negative.       Objective:    Physical Exam Vitals reviewed.  Constitutional:      Appearance: Normal appearance.  HENT:     Head: Normocephalic.     Right Ear: Tympanic membrane, ear canal and external ear normal.     Left Ear: Tympanic membrane, ear canal and external ear normal.     Mouth/Throat:     Mouth: Mucous membranes are moist.     Pharynx: Oropharynx is clear.  Eyes:     Extraocular Movements: Extraocular movements intact.     Conjunctiva/sclera: Conjunctivae normal.     Pupils: Pupils are equal, round, and reactive to light.  Cardiovascular:     Rate and Rhythm: Normal rate and regular rhythm.     Pulses: Normal pulses.     Heart sounds: Normal heart sounds. No murmur heard.   No gallop.  Pulmonary:     Effort: Pulmonary effort is normal. No respiratory distress.     Breath sounds: Normal breath sounds. No  wheezing.  Abdominal:     General: Abdomen is flat. Bowel sounds are normal. There is no distension.     Palpations: Abdomen is soft.     Tenderness: There is no abdominal tenderness.  Musculoskeletal:        General: Normal range of motion.     Cervical back: Normal range of motion.  Skin:    General: Skin is warm.     Capillary Refill: Capillary refill takes less than 2 seconds.  Neurological:     General: No focal deficit present.     Mental Status: She is alert and oriented to person, place, and time. Mental status is at baseline.  Psychiatric:        Mood and Affect: Mood  normal.        Behavior: Behavior normal.    BP 135/70   Pulse 62   Temp 97.8 F (36.6 C)   Resp 16   Ht 5' 5" (1.651 m)   Wt 154 lb (69.9 kg)   SpO2 98%   BMI 25.63 kg/m  Wt Readings from Last 3 Encounters:  08/03/20 154 lb (69.9 kg)  07/08/20 158 lb (71.7 kg)  03/30/20 159 lb 6.4 oz (72.3 kg)   Depression screen [facility] 2/9 08/03/2020 03/30/2020 03/30/2020 11/20/2019 07/17/2019  Decreased Interest 0 1 1 0 0  Down, Depressed, Hopeless 0 0 0 1 1  PHQ - 2 Score 0 _0 Altered sleeping 0 3 - 1 1  Tired, decreased energy 0 0 - 1 1  Change in appetite 0 0 - 0 0  Feeling bad or failure about yourself  0 0 - 0 0  Trouble concentrating 0 0 - 0 0  Moving slowly or fidgety/restless 0 0 - 0 0  Suicidal thoughts 0 0 - 0 0  PHQ-9 Score 0 4 - 3 3  Difficult doing work/chores Not difficult at all Not difficult at all - Not difficult at all Not difficult at all      Health Maintenance Due  Topic Date Due   Hepatitis C Screening  Never done   TETANUS/TDAP  Never done   COLONOSCOPY (Pts 45-45yr Insurance coverage will need to be confirmed)  Never done   Zoster Vaccines- Shingrix (1 of 2) Never done   FOOT EXAM  03/18/2020   COVID-19 Vaccine (4 - Booster for Moderna series) 03/23/2020   OPHTHALMOLOGY EXAM  03/31/2020    There are no preventive care reminders to display for this patient.  Lab Results   Component Value Date   TSH 3.660 03/30/2020   Lab Results  Component Value Date   WBC 9.2 03/30/2020   HGB 13.0 03/30/2020   HCT 38.6 03/30/2020   MCV 86 03/30/2020   PLT 211 03/30/2020   Lab Results  Component Value Date   NA 133 (L) 03/30/2020   K 4.0 03/30/2020   CO2 23 03/30/2020   GLUCOSE 107 (H) 03/30/2020   BUN 12 03/30/2020   CREATININE 0.89 03/30/2020   BILITOT 0.3 03/30/2020   ALKPHOS 88 03/30/2020   AST 26 03/30/2020   ALT 37 (H) 03/30/2020   PROT 7.2 03/30/2020   ALBUMIN 4.3 03/30/2020   CALCIUM 9.3 03/30/2020   ANIONGAP 9 09/22/2015   EGFR 71 03/30/2020   Lab Results  Component Value Date   CHOL 131 03/30/2020   Lab Results  Component Value Date   HDL 45 03/30/2020   Lab Results  Component Value Date   LDLCALC 64 03/30/2020   Lab Results  Component Value Date   TRIG 123 03/30/2020   Lab Results  Component Value Date   CHOLHDL 2.9 03/30/2020   Lab Results  Component Value Date   HGBA1C 6.0 (H) 03/30/2020      Assessment & Plan:   Problem List Items Addressed This Visit       Cardiovascular and Mediastinum   Obesity, diabetes, and hypertension syndrome ([place]   Relevant Orders   Hemoglobin A1c An individual care plan for diabetes was established and reinforced today.  The patient's status was assessed using clinical findings on exam, labs and diagnostic testing. Patient success at meeting goals based on disease specific evidence-based guidelines and found to be fair controlled. Medications were assessed and  patient's understanding of the medical issues , including barriers were assessed. Recommend adherence to a diabetic diet, a graduated exercise program, HgbA1c level is checked quarterly, and urine microalbumin performed yearly .  Annual mono-filament sensation testing performed. Lower blood pressure and control hyperlipidemia is important. Get annual eye exams and annual flu shots and smoking cessation discussed.  Self management goals  were discussed.    Benign hypertension   Relevant Orders   Comprehensive metabolic panel   CBC with Differential/Platelet An individual hypertension care plan was established and reinforced today.  The patient's status was assessed using clinical findings on exam and labs or diagnostic tests. The patient's success at meeting treatment goals on disease specific evidence-based guidelines and found to be fair controlled. SELF MANAGEMENT: The patient and I together assessed ways to personally work towards obtaining the recommended goals. RECOMMENDATIONS: avoid decongestants found in common cold remedies, decrease consumption of alcohol, perform routine monitoring of BP with home BP cuff, exercise, reduction of dietary salt, take medicines as prescribed, try not to miss doses and quit smoking.  Regular exercise and maintaining a healthy weight is needed.  Stress reduction may help. A CLINICAL SUMMARY including written plan identify barriers to care unique to individual due to social or financial issues.  We attempt to mutually creat solutions for individual and family understanding.      Endocrine   Hypothyroidism, adult   Relevant Orders   TSH Patient is known to have hypothyroidism and is n treatment with levothyroxine 30m.  Patient was diagnosed 10 years ago.  Other treatment includes none.  Patient is compliant with medicines and last TSH 6 months ago.  Last TSH was normal.      Musculoskeletal and Integument   Hives   Relevant Medications   clobetasol cream (TEMOVATE) 0.05 % Patien tgets hives in left axilla, cream called in     Other   Mixed hyperlipidemia - Primary   Relevant Orders   Lipid panel AN INDIVIDUAL CARE PLAN for hyperlipidemia/ cholesterol was established and reinforced today.  The patient's status was assessed using clinical findings on exam, lab and other diagnostic tests. The patient's disease status was assessed based on evidence-based guidelines and found to be fair  controlled. MEDICATIONS were reviewed. SELF MANAGEMENT GOALS have been discussed and patient's success at attaining the goal of low cholesterol was assessed. RECOMMENDATION given include regular exercise 3 days a week and low cholesterol/low fat diet. CLINICAL SUMMARY including written plan to identify barriers unique to the patient due to social or economic  reasons was discussed.    Major depressive disorder, recurrent episode, mild ([place] Patient's depression is controlled with citralopen.   Anhedonia better.  PHQ 9 was performed score 0. An individual care plan was established or reinforced today.  The patient's disease status was assessed using clinical findings on exam, labs, and or other diagnostic testing to determine patient's success in meeting treatment goals based on disease specific evidence-based guidelines and found to be improving Recommendations include stay on medicines    BMI 25.0-25.9,adult    Meds ordered this encounter  Medications   clobetasol cream (TEMOVATE) 0.05 %    Sig: Apply 1 application topically 2 (two) times daily.    Dispense:  30 g    Refill:  0    Follow-up: Return in about 4 months (around 12/04/2020) for fasting.    LReinaldo Meeker MD

## 2020-08-04 ENCOUNTER — Other Ambulatory Visit: Payer: Self-pay

## 2020-08-04 DIAGNOSIS — L509 Urticaria, unspecified: Secondary | ICD-10-CM

## 2020-08-04 LAB — CBC WITH DIFFERENTIAL/PLATELET
Basophils Absolute: 0.1 10*3/uL (ref 0.0–0.2)
Basos: 1 %
EOS (ABSOLUTE): 0.4 10*3/uL (ref 0.0–0.4)
Eos: 3 %
Hematocrit: 37.5 % (ref 34.0–46.6)
Hemoglobin: 12.7 g/dL (ref 11.1–15.9)
Immature Grans (Abs): 0.1 10*3/uL (ref 0.0–0.1)
Immature Granulocytes: 1 %
Lymphocytes Absolute: 2.2 10*3/uL (ref 0.7–3.1)
Lymphs: 19 %
MCH: 29.5 pg (ref 26.6–33.0)
MCHC: 33.9 g/dL (ref 31.5–35.7)
MCV: 87 fL (ref 79–97)
Monocytes Absolute: 1.1 10*3/uL — ABNORMAL HIGH (ref 0.1–0.9)
Monocytes: 10 %
Neutrophils Absolute: 7.7 10*3/uL — ABNORMAL HIGH (ref 1.4–7.0)
Neutrophils: 66 %
Platelets: 177 10*3/uL (ref 150–450)
RBC: 4.31 x10E6/uL (ref 3.77–5.28)
RDW: 12.7 % (ref 11.7–15.4)
WBC: 11.6 10*3/uL — ABNORMAL HIGH (ref 3.4–10.8)

## 2020-08-04 LAB — LIPID PANEL
Chol/HDL Ratio: 3.5 ratio (ref 0.0–4.4)
Cholesterol, Total: 163 mg/dL (ref 100–199)
HDL: 46 mg/dL (ref >39)
LDL Chol Calc (NIH): 85 mg/dL (ref 0–99)
Triglycerides: 186 mg/dL — ABNORMAL HIGH (ref 0–149)
VLDL Cholesterol Cal: 32 mg/dL (ref 5–40)

## 2020-08-04 LAB — COMPREHENSIVE METABOLIC PANEL
ALT: 14 IU/L (ref 0–32)
AST: 18 IU/L (ref 0–40)
Albumin/Globulin Ratio: 1.4 (ref 1.2–2.2)
Albumin: 4 g/dL (ref 3.8–4.8)
Alkaline Phosphatase: 97 IU/L (ref 44–121)
BUN/Creatinine Ratio: 12 (ref 12–28)
BUN: 10 mg/dL (ref 8–27)
Bilirubin Total: 0.4 mg/dL (ref 0.0–1.2)
CO2: 25 mmol/L (ref 20–29)
Calcium: 9.6 mg/dL (ref 8.7–10.3)
Chloride: 95 mmol/L — ABNORMAL LOW (ref 96–106)
Creatinine, Ser: 0.86 mg/dL (ref 0.57–1.00)
Globulin, Total: 2.9 g/dL (ref 1.5–4.5)
Glucose: 89 mg/dL (ref 65–99)
Potassium: 4.1 mmol/L (ref 3.5–5.2)
Sodium: 134 mmol/L (ref 134–144)
Total Protein: 6.9 g/dL (ref 6.0–8.5)
eGFR: 74 mL/min/{1.73_m2} (ref >59)

## 2020-08-04 LAB — CARDIOVASCULAR RISK ASSESSMENT

## 2020-08-04 LAB — HEMOGLOBIN A1C
Est. average glucose Bld gHb Est-mCnc: 120 mg/dL
Hgb A1c MFr Bld: 5.8 % — ABNORMAL HIGH (ref 4.8–5.6)

## 2020-08-04 LAB — TSH: TSH: 2.16 u[IU]/mL (ref 0.450–4.500)

## 2020-08-04 MED ORDER — CLOBETASOL PROPIONATE 0.05 % EX CREA: 1.0000 "application " | TOPICAL_CREAM | Freq: Two times a day (BID) | CUTANEOUS | 0 refills | Status: DC

## 2020-08-04 NOTE — Progress Notes (Signed)
Kidney and liver tests normal, triglyceride high 186, TSH 2.16 normal, wbc high any infection?, A1c 5.8 good lp

## 2020-08-29 DIAGNOSIS — H353132 Nonexudative age-related macular degeneration, bilateral, intermediate dry stage: Secondary | ICD-10-CM | POA: Diagnosis not present

## 2020-08-29 DIAGNOSIS — H2513 Age-related nuclear cataract, bilateral: Secondary | ICD-10-CM | POA: Diagnosis not present

## 2020-08-29 DIAGNOSIS — H35033 Hypertensive retinopathy, bilateral: Secondary | ICD-10-CM | POA: Diagnosis not present

## 2020-08-29 DIAGNOSIS — H35712 Central serous chorioretinopathy, left eye: Secondary | ICD-10-CM | POA: Diagnosis not present

## 2020-09-14 ENCOUNTER — Other Ambulatory Visit: Payer: Self-pay | Admitting: Legal Medicine

## 2020-09-24 DIAGNOSIS — S80861A Insect bite (nonvenomous), right lower leg, initial encounter: Secondary | ICD-10-CM | POA: Diagnosis not present

## 2020-09-24 DIAGNOSIS — A932 Colorado tick fever: Secondary | ICD-10-CM | POA: Diagnosis not present

## 2020-09-29 ENCOUNTER — Other Ambulatory Visit: Payer: Self-pay | Admitting: Legal Medicine

## 2020-10-31 ENCOUNTER — Other Ambulatory Visit: Payer: Self-pay | Admitting: Legal Medicine

## 2020-10-31 DIAGNOSIS — F330 Major depressive disorder, recurrent, mild: Secondary | ICD-10-CM

## 2020-10-31 NOTE — Telephone Encounter (Signed)
Refill sent to pharmacy per [facility]

## 2020-11-10 ENCOUNTER — Other Ambulatory Visit: Payer: Self-pay | Admitting: Legal Medicine

## 2020-11-14 ENCOUNTER — Encounter: Payer: Self-pay | Admitting: Legal Medicine

## 2020-11-14 ENCOUNTER — Other Ambulatory Visit: Payer: Self-pay

## 2020-11-14 ENCOUNTER — Ambulatory Visit (INDEPENDENT_AMBULATORY_CARE_PROVIDER_SITE_OTHER): Payer: PPO | Admitting: Legal Medicine

## 2020-11-14 VITALS — BP 120/78 | HR 77 | Temp 97.9°F | Ht 65.0 in | Wt 157.0 lb

## 2020-11-14 DIAGNOSIS — M7551 Bursitis of right shoulder: Secondary | ICD-10-CM | POA: Diagnosis not present

## 2020-11-14 DIAGNOSIS — M19011 Primary osteoarthritis, right shoulder: Secondary | ICD-10-CM | POA: Diagnosis not present

## 2020-11-14 HISTORY — DX: Bursitis of right shoulder: M75.51

## 2020-11-14 NOTE — Progress Notes (Signed)
 Acute Office Visit  Subjective:    Patient ID: Cassandra Fox, female    DOB: 08/04/1955, 65 y.o.   MRN: [id number]  Chief Complaint  Patient presents with   Arm Pain    Right    HPI: Patient is in today for right arm pain, shock on right chin 3 to 4 times then stopped last Friday.  Having pain in right upper arm starting in march.  She had blood drawn and it hurts.  Past Medical History:  Diagnosis Date   Anxiety    Breast mass, right    Depression    Diabetes mellitus without complication (HCC)    Hyperlipidemia    Hypertension    Hypothyroidism     Past Surgical History:  Procedure Laterality Date   BREAST EXCISIONAL BIOPSY Right    benign   BREAST LUMPECTOMY WITH RADIOACTIVE SEED LOCALIZATION Right 09/23/2015   Procedure: RIGHT BREAST LUMPECTOMY WITH RADIOACTIVE SEED LOCALIZATION;  Surgeon: Benjamin Hoxworth, MD;  Location: [facility];  Service: General;  Laterality: Right;    Family History  Problem Relation Age of Onset   Hyperlipidemia Mother    Diabetes Mother    Hyperlipidemia Father    Hypertension Father     Social History   Socioeconomic History   Marital status: Married    Spouse name: Not on file   Number of children: 3   Years of education: Not on file   Highest education level: Not on file  Occupational History   Not on file  Tobacco Use   Smoking status: Every Day    Packs/day: 0.50    Types: Cigarettes   Smokeless tobacco: Never  Vaping Use   Vaping Use: Never used  Substance and Sexual Activity   Alcohol use: Not Currently    Comment: social   Drug use: No   Sexual activity: Not Currently  Other Topics Concern   Not on file  Social History Narrative   Not on file   Social Determinants of Health   Financial Resource Strain: Not on file  Food Insecurity: Not on file  Transportation Needs: Not on file  Physical Activity: Not on file  Stress: Not on file  Social Connections: Not on file  Intimate Partner  Violence: Not on file    Outpatient Medications Prior to Visit  Medication Sig Dispense Refill   ALPRAZolam (XANAX) 0.5 MG tablet TAKE 1 TABLET(0.5 MG) BY MOUTH AT BEDTIME AS NEEDED FOR ANXIETY 30 tablet 3   aspirin EC 81 MG tablet Take 81 mg by mouth daily.     citalopram (CELEXA) 40 MG tablet TAKE 1 TABLET BY MOUTH EVERY DAY 30 tablet 3   clobetasol cream (TEMOVATE) 0.05 % Apply 1 application topically 2 (two) times daily. 30 g 0   levothyroxine (SYNTHROID) 75 MCG tablet TAKE 1 TABLET(75 MCG) BY MOUTH EVERY DAY 90 tablet 1   losartan-hydrochlorothiazide (HYZAAR) 100-25 MG tablet TAKE 1 TABLET BY MOUTH DAILY 90 tablet 2   metFORMIN (GLUCOPHAGE) 500 MG tablet TAKE 1 TABLET BY MOUTH TWICE DAILY. 180 tablet 2   ONETOUCH VERIO test strip USE TO CHECK BLOOD SUGAR TWICE DAILY 100 strip 4   simvastatin (ZOCOR) 20 MG tablet TAKE 1 TABLET BY MOUTH AT BEDTIME 90 tablet 2   triamcinolone (NASACORT) 55 MCG/ACT AERO nasal inhaler 1 puff in each nostril     Vitamin D, Cholecalciferol, 1000 units CAPS Take by mouth.     Zinc Sulfate (ZINC 15 PO) Take by   mouth.     PNEUMOVAX 23 25 MCG/0.5ML injection      Semaglutide,0.25 or 0.5MG/DOS, (OZEMPIC, 0.25 OR 0.5 MG/DOSE,) 2 MG/1.5ML SOPN Inject 0.5 mg into the skin once a week. 4.5 mL 3   No facility-administered medications prior to visit.    Allergies  Allergen Reactions   Codeine Nausea Only    Review of Systems  Constitutional:  Negative for activity change and appetite change.  HENT:  Negative for congestion.   Eyes:  Negative for visual disturbance.  Respiratory:  Negative for chest tightness and shortness of breath.   Cardiovascular:  Negative for chest pain, palpitations and leg swelling.  Gastrointestinal:  Negative for abdominal distention and abdominal pain.  Endocrine: Negative for polyuria.  Genitourinary:  Negative for difficulty urinating and dysuria.  Musculoskeletal:  Positive for arthralgias.  Neurological: Negative.    Psychiatric/Behavioral: Negative.        Objective:    Physical Exam Vitals reviewed.  Constitutional:      General: She is not in acute distress.    Appearance: Normal appearance.  HENT:     Right Ear: Tympanic membrane, ear canal and external ear normal.     Left Ear: Tympanic membrane, ear canal and external ear normal.     Mouth/Throat:     Mouth: Mucous membranes are moist.     Pharynx: Oropharynx is clear.  Eyes:     Extraocular Movements: Extraocular movements intact.     Conjunctiva/sclera: Conjunctivae normal.     Pupils: Pupils are equal, round, and reactive to light.  Cardiovascular:     Rate and Rhythm: Normal rate and regular rhythm.     Pulses: Normal pulses.     Heart sounds: Normal heart sounds. No murmur heard.   No gallop.  Pulmonary:     Effort: Pulmonary effort is normal. No respiratory distress.     Breath sounds: Normal breath sounds. No wheezing.  Abdominal:     General: Abdomen is flat. Bowel sounds are normal. There is no distension.     Tenderness: There is no abdominal tenderness.  Musculoskeletal:     Right shoulder: Tenderness present. Decreased strength.     Cervical back: Normal range of motion.     Comments: Full ROM without pain, but chronic pain in upper arm especially at night  Skin:    General: Skin is warm.  Neurological:     General: No focal deficit present.     Mental Status: She is alert and oriented to person, place, and time. Mental status is at baseline.    BP 120/78 (BP Location: Left Arm, Patient Position: Sitting, Cuff Size: Normal)   Pulse 77   Temp 97.9 F (36.6 C) (Temporal)   Ht 5' 5" (1.651 m)   Wt 157 lb (71.2 kg)   SpO2 96%   BMI 26.13 kg/m  Wt Readings from Last 3 Encounters:  11/14/20 157 lb (71.2 kg)  08/03/20 154 lb (69.9 kg)  07/08/20 158 lb (71.7 kg)    Health Maintenance Due  Topic Date Due   Hepatitis C Screening  Never done   TETANUS/TDAP  Never done   COLONOSCOPY (Pts 45-10yr Insurance  coverage will need to be confirmed)  Never done   Zoster Vaccines- Shingrix (1 of 2) Never done   FOOT EXAM  03/18/2020   OPHTHALMOLOGY EXAM  03/31/2020   COVID-19 Vaccine (5 - Booster for Moderna series) 10/04/2020   Pneumonia Vaccine 67 Years old (2 - PCV) 11/07/2020  There are no preventive care reminders to display for this patient.   Lab Results  Component Value Date   TSH 2.160 08/03/2020   Lab Results  Component Value Date   WBC 11.6 (H) 08/03/2020   HGB 12.7 08/03/2020   HCT 37.5 08/03/2020   MCV 87 08/03/2020   PLT 177 08/03/2020   Lab Results  Component Value Date   NA 134 08/03/2020   K 4.1 08/03/2020   CO2 25 08/03/2020   GLUCOSE 89 08/03/2020   BUN 10 08/03/2020   CREATININE 0.86 08/03/2020   BILITOT 0.4 08/03/2020   ALKPHOS 97 08/03/2020   AST 18 08/03/2020   ALT 14 08/03/2020   PROT 6.9 08/03/2020   ALBUMIN 4.0 08/03/2020   CALCIUM 9.6 08/03/2020   ANIONGAP 9 09/22/2015   EGFR 74 08/03/2020   Lab Results  Component Value Date   CHOL 163 08/03/2020   Lab Results  Component Value Date   HDL 46 08/03/2020   Lab Results  Component Value Date   LDLCALC 85 08/03/2020   Lab Results  Component Value Date   TRIG 186 (H) 08/03/2020   Lab Results  Component Value Date   CHOLHDL 3.5 08/03/2020   Lab Results  Component Value Date   HGBA1C 5.8 (H) 08/03/2020       Assessment & Plan:   Problem List Items Addressed This Visit       Musculoskeletal and Integument   Bursitis of right shoulder - Primary   Relevant Orders   DG Shoulder Right Patient is having right shoulder pain after working.  Get x-ray, use nsaid for 2 weeks      Orders Placed This Encounter  Procedures   DG Shoulder Right     Follow-up: Return as scheduled.  An After Visit Summary was printed and given to the patient.  Lawrence Perry, MD [facility] [phone]  

## 2020-11-14 NOTE — Progress Notes (Signed)
Subjective:  Patient ID: Cassandra Fox, female    DOB: November 12, 1955  Age: 65 y.o. MRN: [id number]  Chief Complaint  Patient presents with   Arm Pain    Right    HPI   Current Outpatient Medications on File Prior to Visit  Medication Sig Dispense Refill   ALPRAZolam (XANAX) 0.5 MG tablet TAKE 1 TABLET(0.5 MG) BY MOUTH AT BEDTIME AS NEEDED FOR ANXIETY 30 tablet 3   aspirin EC 81 MG tablet Take 81 mg by mouth daily.     citalopram (CELEXA) 40 MG tablet TAKE 1 TABLET BY MOUTH EVERY DAY 30 tablet 3   clobetasol cream (TEMOVATE) 6.29 % Apply 1 application topically 2 (two) times daily. 30 g 0   levothyroxine (SYNTHROID) 75 MCG tablet TAKE 1 TABLET(75 MCG) BY MOUTH EVERY DAY 90 tablet 1   losartan-hydrochlorothiazide (HYZAAR) 100-25 MG tablet TAKE 1 TABLET BY MOUTH DAILY 90 tablet 2   metFORMIN (GLUCOPHAGE) 500 MG tablet TAKE 1 TABLET BY MOUTH TWICE DAILY. 180 tablet 2   ONETOUCH VERIO test strip USE TO CHECK BLOOD SUGAR TWICE DAILY 100 strip 4   simvastatin (ZOCOR) 20 MG tablet TAKE 1 TABLET BY MOUTH AT BEDTIME 90 tablet 2   triamcinolone (NASACORT) 55 MCG/ACT AERO nasal inhaler 1 puff in each nostril     Vitamin D, Cholecalciferol, 1000 units CAPS Take by mouth.     Zinc Sulfate (ZINC 15 PO) Take by mouth.     No current facility-administered medications on file prior to visit.   Past Medical History:  Diagnosis Date   Anxiety    Breast mass, right    Depression    Diabetes mellitus without complication ([place])    Hyperlipidemia    Hypertension    Hypothyroidism    Past Surgical History:  Procedure Laterality Date   BREAST EXCISIONAL BIOPSY Right    benign   BREAST LUMPECTOMY WITH RADIOACTIVE SEED LOCALIZATION Right 09/23/2015   Procedure: RIGHT BREAST LUMPECTOMY WITH RADIOACTIVE SEED LOCALIZATION;  Surgeon: Excell Seltzer, MD;  Location: [facility];  Service: General;  Laterality: Right;    Family History  Problem Relation Age of Onset   Hyperlipidemia  Mother    Diabetes Mother    Hyperlipidemia Father    Hypertension Father    Social History   Socioeconomic History   Marital status: Married    Spouse name: Not on file   Number of children: 3   Years of education: Not on file   Highest education level: Not on file  Occupational History   Not on file  Tobacco Use   Smoking status: Every Day    Packs/day: 0.50    Types: Cigarettes   Smokeless tobacco: Never  Vaping Use   Vaping Use: Never used  Substance and Sexual Activity   Alcohol use: Not Currently    Comment: social   Drug use: No   Sexual activity: Not Currently  Other Topics Concern   Not on file  Social History Narrative   Not on file   Social Determinants of Health   Financial Resource Strain: Not on file  Food Insecurity: Not on file  Transportation Needs: Not on file  Physical Activity: Not on file  Stress: Not on file  Social Connections: Not on file    Review of Systems   Objective:  BP 120/78 (BP Location: Left Arm, Patient Position: Sitting, Cuff Size: Normal)   Pulse 77   Temp 97.9 F (36.6 C) (Temporal)  Ht 5\' 5"  (1.651 m)   Wt 157 lb (71.2 kg)   SpO2 96%   BMI 26.13 kg/m   BP/Weight 11/14/2020 08/03/2020 5/68/1275  Systolic BP 170 017 494  Diastolic BP 78 70 80  Wt. (Lbs) 157 154 158  BMI 26.13 25.63 26.29    Physical Exam  Diabetic Foot Exam - Simple   No data filed      Lab Results  Component Value Date   WBC 11.6 (H) 08/03/2020   HGB 12.7 08/03/2020   HCT 37.5 08/03/2020   PLT 177 08/03/2020   GLUCOSE 89 08/03/2020   CHOL 163 08/03/2020   TRIG 186 (H) 08/03/2020   HDL 46 08/03/2020   LDLCALC 85 08/03/2020   ALT 14 08/03/2020   AST 18 08/03/2020   NA 134 08/03/2020   K 4.1 08/03/2020   CL 95 (L) 08/03/2020   CREATININE 0.86 08/03/2020   BUN 10 08/03/2020   CO2 25 08/03/2020   TSH 2.160 08/03/2020   HGBA1C 5.8 (H) 08/03/2020   MICROALBUR 30 03/19/2019      Assessment & Plan:   Problem List Items  Addressed This Visit   None .  No orders of the defined types were placed in this encounter.   No orders of the defined types were placed in this encounter.    Follow-up: No follow-ups on file.  An After Visit Summary was printed and given to the patient.  Reinaldo Meeker, MD [facility] [phone]

## 2020-11-25 ENCOUNTER — Other Ambulatory Visit: Payer: Self-pay | Admitting: Legal Medicine

## 2020-11-26 ENCOUNTER — Other Ambulatory Visit: Payer: Self-pay | Admitting: Legal Medicine

## 2020-11-26 DIAGNOSIS — F330 Major depressive disorder, recurrent, mild: Secondary | ICD-10-CM

## 2020-12-07 ENCOUNTER — Ambulatory Visit: Payer: PPO | Admitting: Legal Medicine

## 2020-12-20 ENCOUNTER — Encounter: Payer: Self-pay | Admitting: Legal Medicine

## 2020-12-20 ENCOUNTER — Ambulatory Visit (INDEPENDENT_AMBULATORY_CARE_PROVIDER_SITE_OTHER): Payer: PPO | Admitting: Legal Medicine

## 2020-12-20 ENCOUNTER — Other Ambulatory Visit: Payer: Self-pay

## 2020-12-20 VITALS — BP 124/60 | HR 72 | Temp 98.3°F | Resp 14 | Wt 156.0 lb

## 2020-12-20 DIAGNOSIS — E1169 Type 2 diabetes mellitus with other specified complication: Secondary | ICD-10-CM

## 2020-12-20 DIAGNOSIS — E1159 Type 2 diabetes mellitus with other circulatory complications: Secondary | ICD-10-CM | POA: Diagnosis not present

## 2020-12-20 DIAGNOSIS — E669 Obesity, unspecified: Secondary | ICD-10-CM

## 2020-12-20 DIAGNOSIS — E782 Mixed hyperlipidemia: Secondary | ICD-10-CM | POA: Diagnosis not present

## 2020-12-20 DIAGNOSIS — I10 Essential (primary) hypertension: Secondary | ICD-10-CM

## 2020-12-20 DIAGNOSIS — I152 Hypertension secondary to endocrine disorders: Secondary | ICD-10-CM

## 2020-12-20 DIAGNOSIS — E039 Hypothyroidism, unspecified: Secondary | ICD-10-CM

## 2020-12-20 NOTE — Progress Notes (Signed)
New Patient Office Visit  Subjective:  Patient ID: Cassandra Fox, female    DOB: Apr 25, 1955  Age: 65 y.o. MRN: [id number]  CC:  Chief Complaint  Patient presents with   Hyperlipidemia    HPI Cassandra Fox presents for chronic visit  Patient present with type 2 diabetes.  Specifically, this is type 2, noninsulin requiring diabetes, complicated by hypertension and hypercholesterolemia.  Compliance with treatment has been good; patient take medicines as directed, maintains diet and exercise regimen, follows up as directed, and is keeping glucose diary.  Date of  diagnosis 2010.  Depression screen has been performed.Tobacco screen smoker. Current medicines for diabetes metformin.  Patient is on losartan for renal protection and simvastatin for cholesterol control.  Patient performs foot exams daily and last ophthalmologic exam was eye exam monday.   Patient presents for follow up of hypertension.  Patient tolerating lisinopril/HCTZ well with side effects.  Patient was diagnosed with hypertension 2010 so has been treated for hypertension for 12 years.Patient is working on maintaining diet and exercise regimen and follows up as directed. Complication include none.   Patient presents with hyperlipidemia.  Compliance with treatment has been good; patient takes medicines as directed, maintains low cholesterol diet, follows up as directed, and maintains exercise regimen.  Patient is using simvastatin without problems.   Past Medical History:  Diagnosis Date   Anxiety    Breast mass, right    Depression    Diabetes mellitus without complication ([place])    Hyperlipidemia    Hypertension    Hypothyroidism     Past Surgical History:  Procedure Laterality Date   BREAST EXCISIONAL BIOPSY Right    benign   BREAST LUMPECTOMY WITH RADIOACTIVE SEED LOCALIZATION Right 09/23/2015   Procedure: RIGHT BREAST LUMPECTOMY WITH RADIOACTIVE SEED LOCALIZATION;  Surgeon: Excell Seltzer, MD;  Location:  [place];  Service: General;  Laterality: Right;    Family History  Problem Relation Age of Onset   Hyperlipidemia Mother    Diabetes Mother    Hyperlipidemia Father    Hypertension Father     Social History   Socioeconomic History   Marital status: Married    Spouse name: Not on file   Number of children: 3   Years of education: Not on file   Highest education level: Not on file  Occupational History   Not on file  Tobacco Use   Smoking status: Every Day    Packs/day: 0.50    Types: Cigarettes   Smokeless tobacco: Never  Vaping Use   Vaping Use: Never used  Substance and Sexual Activity   Alcohol use: Not Currently    Comment: social   Drug use: No   Sexual activity: Not Currently  Other Topics Concern   Not on file  Social History Narrative   Not on file   Social Determinants of Health   Financial Resource Strain: Not on file  Food Insecurity: Not on file  Transportation Needs: Not on file  Physical Activity: Not on file  Stress: Not on file  Social Connections: Not on file  Intimate Partner Violence: Not on file    ROS Review of Systems  Constitutional:  Negative for activity change and appetite change.  HENT:  Negative for congestion.   Eyes:  Negative for visual disturbance.  Respiratory:  Negative for chest tightness and shortness of breath.   Cardiovascular:  Negative for chest pain, palpitations and leg swelling.  Gastrointestinal:  Negative for abdominal distention and  abdominal pain.  Endocrine: Negative for polyuria.  Genitourinary:  Negative for difficulty urinating and dysuria.  Musculoskeletal:  Negative for arthralgias and back pain.  Neurological: Negative.   Psychiatric/Behavioral: Negative.     Objective:   Today's Vitals: BP 124/60   Pulse 72   Temp 98.3 F (36.8 C)   Resp 14   Wt 156 lb (70.8 kg)   BMI 25.96 kg/m   Physical Exam Vitals reviewed.  Constitutional:      General: She is not in acute  distress.    Appearance: Normal appearance.  HENT:     Right Ear: Tympanic membrane, ear canal and external ear normal.     Left Ear: Tympanic membrane, ear canal and external ear normal.  Eyes:     Extraocular Movements: Extraocular movements intact.     Conjunctiva/sclera: Conjunctivae normal.     Pupils: Pupils are equal, round, and reactive to light.  Cardiovascular:     Rate and Rhythm: Normal rate and regular rhythm.     Pulses: Normal pulses.     Heart sounds: No murmur heard.   No gallop.  Pulmonary:     Effort: Pulmonary effort is normal. No respiratory distress.     Breath sounds: Normal breath sounds. No wheezing.  Abdominal:     General: Abdomen is flat. Bowel sounds are normal. There is no distension.     Palpations: Abdomen is soft.     Tenderness: There is no abdominal tenderness.  Musculoskeletal:     Cervical back: Normal range of motion and neck supple.     Right lower leg: No edema.     Left lower leg: No edema.  Skin:    General: Skin is warm.     Capillary Refill: Capillary refill takes less than 2 seconds.  Neurological:     General: No focal deficit present.     Mental Status: She is alert and oriented to person, place, and time. Mental status is at baseline.     Gait: Gait normal.     Deep Tendon Reflexes: Reflexes normal.  Psychiatric:        Mood and Affect: Mood normal.        Thought Content: Thought content normal.        Judgment: Judgment normal.    Assessment & Plan:   Problem List Items Addressed This Visit       Cardiovascular and Mediastinum   Obesity, diabetes, and hypertension syndrome ([place])   Relevant Orders   Hemoglobin A1c An individual care plan for diabetes was established and reinforced today.  The patient's status was assessed using clinical findings on exam, labs and diagnostic testing. Patient success at meeting goals based on disease specific evidence-based guidelines and found to be good controlled. Medications were  assessed and patient's understanding of the medical issues , including barriers were assessed. Recommend adherence to a diabetic diet, a graduated exercise program, HgbA1c level is checked quarterly, and urine microalbumin performed yearly .  Annual mono-filament sensation testing performed. Lower blood pressure and control hyperlipidemia is important. Get annual eye exams and annual flu shots and smoking cessation discussed.  Self management goals were discussed.    Benign hypertension   Relevant Orders   CBC with Differential/Platelet   Comprehensive metabolic panel An individual hypertension care plan was established and reinforced today.  The patient's status was assessed using clinical findings on exam and labs or diagnostic tests. The patient's success at meeting treatment goals on disease specific evidence-based guidelines  and found to be well controlled. SELF MANAGEMENT: The patient and I together assessed ways to personally work towards obtaining the recommended goals. RECOMMENDATIONS: avoid decongestants found in common cold remedies, decrease consumption of alcohol, perform routine monitoring of BP with home BP cuff, exercise, reduction of dietary salt, take medicines as prescribed, try not to miss doses and quit smoking.  Regular exercise and maintaining a healthy weight is needed.  Stress reduction may help. A CLINICAL SUMMARY including written plan identify barriers to care unique to individual due to social or financial issues.  We attempt to mutually creat solutions for individual and family understanding.      Endocrine   Hypothyroidism, adult Patient is known to have hypothyroidism and is n treatment with levothyroxine 51mcg.  Patient was diagnosed 10 years ago.  Other treatment includes none.  Patient is compliant with medicines and last TSH 6 months ago.  Last TSH was normal.      Other   Mixed hyperlipidemia - Primary   Relevant Orders   Lipid panel AN INDIVIDUAL CARE PLAN for  hyperlipidemia/ cholesterol was established and reinforced today.  The patient's status was assessed using clinical findings on exam, lab and other diagnostic tests. The patient's disease status was assessed based on evidence-based guidelines and found to be well controlled. MEDICATIONS were reviewed. SELF MANAGEMENT GOALS have been discussed and patient's success at attaining the goal of low cholesterol was assessed. RECOMMENDATION given include regular exercise 3 days a week and low cholesterol/low fat diet. CLINICAL SUMMARY including written plan to identify barriers unique to the patient due to social or economic  reasons was discussed.     Outpatient Encounter Medications as of 12/20/2020  Medication Sig   ALPRAZolam (XANAX) 0.5 MG tablet TAKE 1 TABLET(0.5 MG) BY MOUTH AT BEDTIME AS NEEDED FOR ANXIETY   aspirin EC 81 MG tablet Take 81 mg by mouth daily.   citalopram (CELEXA) 40 MG tablet TAKE 1 TABLET BY MOUTH EVERY DAY   clobetasol cream (TEMOVATE) 8.81 % Apply 1 application topically 2 (two) times daily.   levothyroxine (SYNTHROID) 75 MCG tablet TAKE 1 TABLET(75 MCG) BY MOUTH EVERY DAY   losartan-hydrochlorothiazide (HYZAAR) 100-25 MG tablet TAKE 1 TABLET BY MOUTH DAILY   metFORMIN (GLUCOPHAGE) 500 MG tablet TAKE 1 TABLET BY MOUTH TWICE DAILY.   ONETOUCH VERIO test strip USE TO CHECK BLOOD SUGAR TWICE DAILY   simvastatin (ZOCOR) 20 MG tablet TAKE 1 TABLET BY MOUTH AT BEDTIME   triamcinolone (NASACORT) 55 MCG/ACT AERO nasal inhaler 1 puff in each nostril   Vitamin D, Cholecalciferol, 1000 units CAPS Take by mouth.   Zinc Sulfate (ZINC 15 PO) Take by mouth.   No facility-administered encounter medications on file as of 12/20/2020.    Follow-up: Return in about 4 months (around 04/19/2021) for fasting.   Reinaldo Meeker, MD

## 2020-12-21 LAB — CBC WITH DIFFERENTIAL/PLATELET
Basophils Absolute: 0.1 10*3/uL (ref 0.0–0.2)
Basos: 1 %
EOS (ABSOLUTE): 0.5 10*3/uL — ABNORMAL HIGH (ref 0.0–0.4)
Eos: 4 %
Hematocrit: 37.3 % (ref 34.0–46.6)
Hemoglobin: 12.6 g/dL (ref 11.1–15.9)
Immature Grans (Abs): 0.1 10*3/uL (ref 0.0–0.1)
Immature Granulocytes: 1 %
Lymphocytes Absolute: 2.2 10*3/uL (ref 0.7–3.1)
Lymphs: 20 %
MCH: 29.1 pg (ref 26.6–33.0)
MCHC: 33.8 g/dL (ref 31.5–35.7)
MCV: 86 fL (ref 79–97)
Monocytes Absolute: 1 10*3/uL — ABNORMAL HIGH (ref 0.1–0.9)
Monocytes: 9 %
Neutrophils Absolute: 7.2 10*3/uL — ABNORMAL HIGH (ref 1.4–7.0)
Neutrophils: 65 %
Platelets: 218 10*3/uL (ref 150–450)
RBC: 4.33 x10E6/uL (ref 3.77–5.28)
RDW: 12.8 % (ref 11.7–15.4)
WBC: 11.1 10*3/uL — ABNORMAL HIGH (ref 3.4–10.8)

## 2020-12-21 LAB — COMPREHENSIVE METABOLIC PANEL
ALT: 17 IU/L (ref 0–32)
AST: 16 IU/L (ref 0–40)
Albumin/Globulin Ratio: 1.5 (ref 1.2–2.2)
Albumin: 4.3 g/dL (ref 3.8–4.8)
Alkaline Phosphatase: 87 IU/L (ref 44–121)
BUN/Creatinine Ratio: 14 (ref 12–28)
BUN: 11 mg/dL (ref 8–27)
Bilirubin Total: 0.3 mg/dL (ref 0.0–1.2)
CO2: 26 mmol/L (ref 20–29)
Calcium: 9.4 mg/dL (ref 8.7–10.3)
Chloride: 97 mmol/L (ref 96–106)
Creatinine, Ser: 0.8 mg/dL (ref 0.57–1.00)
Globulin, Total: 2.9 g/dL (ref 1.5–4.5)
Glucose: 91 mg/dL (ref 70–99)
Potassium: 4.5 mmol/L (ref 3.5–5.2)
Sodium: 137 mmol/L (ref 134–144)
Total Protein: 7.2 g/dL (ref 6.0–8.5)
eGFR: 81 mL/min/{1.73_m2} (ref >59)

## 2020-12-21 LAB — LIPID PANEL
Chol/HDL Ratio: 3.7 ratio (ref 0.0–4.4)
Cholesterol, Total: 157 mg/dL (ref 100–199)
HDL: 42 mg/dL (ref >39)
LDL Chol Calc (NIH): 77 mg/dL (ref 0–99)
Triglycerides: 229 mg/dL — ABNORMAL HIGH (ref 0–149)
VLDL Cholesterol Cal: 38 mg/dL (ref 5–40)

## 2020-12-21 LAB — HEMOGLOBIN A1C
Est. average glucose Bld gHb Est-mCnc: 131 mg/dL
Hgb A1c MFr Bld: 6.2 % — ABNORMAL HIGH (ref 4.8–5.6)

## 2020-12-21 LAB — CARDIOVASCULAR RISK ASSESSMENT

## 2020-12-21 NOTE — Progress Notes (Signed)
Cbc ok, kidney and liver tests normal, A1c 6.2, triglycerides 229 high,  lp

## 2020-12-26 DIAGNOSIS — H353132 Nonexudative age-related macular degeneration, bilateral, intermediate dry stage: Secondary | ICD-10-CM | POA: Diagnosis not present

## 2020-12-26 DIAGNOSIS — H35033 Hypertensive retinopathy, bilateral: Secondary | ICD-10-CM | POA: Diagnosis not present

## 2020-12-26 DIAGNOSIS — H35712 Central serous chorioretinopathy, left eye: Secondary | ICD-10-CM | POA: Diagnosis not present

## 2020-12-26 DIAGNOSIS — H2513 Age-related nuclear cataract, bilateral: Secondary | ICD-10-CM | POA: Diagnosis not present

## 2021-01-07 ENCOUNTER — Other Ambulatory Visit: Payer: Self-pay | Admitting: Legal Medicine

## 2021-01-25 ENCOUNTER — Other Ambulatory Visit: Payer: Self-pay

## 2021-01-25 ENCOUNTER — Telehealth: Payer: Self-pay

## 2021-01-25 MED ORDER — METOPROLOL TARTRATE 25 MG PO TABS: 25.0000 mg | ORAL_TABLET | Freq: Every day | ORAL | 1 refills | Status: DC

## 2021-01-25 NOTE — Telephone Encounter (Signed)
Is her BP consistently high or only high once lp

## 2021-01-25 NOTE — Telephone Encounter (Signed)
We can try to change to new dose lp

## 2021-01-25 NOTE — Telephone Encounter (Signed)
Patient was concerned about her blood pressure. She checked her blood pressure and it was 150/80 pulse 75. She asked if she needs to add another blood pressure medicine or take losartan/HTCZ 100-25 BID. Please advice.

## 2021-01-25 NOTE — Telephone Encounter (Signed)
Per Dr Henrene Pastor sent prescription of Metoprolol 25 mg daily to pharmacy and patient was informed.

## 2021-02-20 ENCOUNTER — Other Ambulatory Visit: Payer: Self-pay | Admitting: Legal Medicine

## 2021-02-20 DIAGNOSIS — F330 Major depressive disorder, recurrent, mild: Secondary | ICD-10-CM

## 2021-02-21 DIAGNOSIS — M8580 Other specified disorders of bone density and structure, unspecified site: Secondary | ICD-10-CM | POA: Diagnosis not present

## 2021-02-21 DIAGNOSIS — Z01419 Encounter for gynecological examination (general) (routine) without abnormal findings: Secondary | ICD-10-CM | POA: Diagnosis not present

## 2021-02-21 DIAGNOSIS — Z780 Asymptomatic menopausal state: Secondary | ICD-10-CM | POA: Diagnosis not present

## 2021-03-01 ENCOUNTER — Other Ambulatory Visit: Payer: Self-pay | Admitting: Nurse Practitioner

## 2021-03-01 DIAGNOSIS — Z1231 Encounter for screening mammogram for malignant neoplasm of breast: Secondary | ICD-10-CM

## 2021-03-01 DIAGNOSIS — Z780 Asymptomatic menopausal state: Secondary | ICD-10-CM

## 2021-03-14 ENCOUNTER — Encounter: Payer: Self-pay | Admitting: Legal Medicine

## 2021-03-14 ENCOUNTER — Ambulatory Visit (INDEPENDENT_AMBULATORY_CARE_PROVIDER_SITE_OTHER): Payer: PPO | Admitting: Legal Medicine

## 2021-03-14 VITALS — BP 144/84 | HR 68 | Temp 98.4°F | Ht 65.0 in | Wt 158.2 lb

## 2021-03-14 DIAGNOSIS — J069 Acute upper respiratory infection, unspecified: Secondary | ICD-10-CM

## 2021-03-14 LAB — POC COVID19 BINAXNOW: SARS Coronavirus 2 Ag: NEGATIVE

## 2021-03-14 LAB — POCT RAPID STREP A: Strep A Ag: NOT DETECTED

## 2021-03-14 MED ORDER — FLUTICASONE PROPIONATE 50 MCG/ACT NA SUSP: 2.0000 | Freq: Every day | NASAL | 6 refills | Status: DC

## 2021-03-14 NOTE — Progress Notes (Signed)
Subjective:  Patient ID: Cassandra Fox, female    DOB: 1955-09-14  Age: 66 y.o. MRN: [id number]  Chief Complaint  Patient presents with   Cough   Nasal Congestion   Sore Throat    Cough Associated symptoms include a sore throat. Pertinent negatives include no chest pain, chills, ear pain, fever, headaches, myalgias, rhinorrhea, shortness of breath or wheezing.  Sore Throat  Associated symptoms include congestion and coughing. Pertinent negatives include no abdominal pain, diarrhea, ear discharge, ear pain, headaches, neck pain, shortness of breath or vomiting.    Patient presents today with nasal congestion, cough, sore thorat. All symptoms started Saturday. Sneezing, some epistaxis. Current Outpatient Medications on File Prior to Visit  Medication Sig Dispense Refill   ALPRAZolam (XANAX) 0.5 MG tablet TAKE 1 TABLET(0.5 MG) BY MOUTH AT BEDTIME AS NEEDED FOR ANXIETY 30 tablet 3   aspirin EC 81 MG tablet Take 81 mg by mouth daily.     citalopram (CELEXA) 40 MG tablet TAKE 1 TABLET BY MOUTH EVERY DAY 30 tablet 3   clobetasol cream (TEMOVATE) 8.75 % Apply 1 application topically 2 (two) times daily. 30 g 0   levothyroxine (SYNTHROID) 75 MCG tablet TAKE 1 TABLET(75 MCG) BY MOUTH EVERY DAY 90 tablet 1   losartan-hydrochlorothiazide (HYZAAR) 100-25 MG tablet TAKE 1 TABLET BY MOUTH DAILY 90 tablet 2   metFORMIN (GLUCOPHAGE) 500 MG tablet TAKE 1 TABLET BY MOUTH TWICE DAILY. 180 tablet 2   metoprolol tartrate (LOPRESSOR) 25 MG tablet Take 1 tablet (25 mg total) by mouth daily. 30 tablet 1   ONETOUCH VERIO test strip USE TO CHECK BLOOD SUGAR TWICE DAILY 100 strip 4   simvastatin (ZOCOR) 20 MG tablet TAKE 1 TABLET BY MOUTH AT BEDTIME 90 tablet 2   triamcinolone (NASACORT) 55 MCG/ACT AERO nasal inhaler 1 puff in each nostril     Vitamin D, Cholecalciferol, 1000 units CAPS Take by mouth.     Zinc Sulfate (ZINC 15 PO) Take by mouth.     No current facility-administered medications on file  prior to visit.   Past Medical History:  Diagnosis Date   Anxiety    Breast mass, right    Depression    Diabetes mellitus without complication ([place])    Hyperlipidemia    Hypertension    Hypothyroidism    Past Surgical History:  Procedure Laterality Date   BREAST EXCISIONAL BIOPSY Right    benign   BREAST LUMPECTOMY WITH RADIOACTIVE SEED LOCALIZATION Right 09/23/2015   Procedure: RIGHT BREAST LUMPECTOMY WITH RADIOACTIVE SEED LOCALIZATION;  Surgeon: Excell Seltzer, MD;  Location: [facility];  Service: General;  Laterality: Right;    Family History  Problem Relation Age of Onset   Hyperlipidemia Mother    Diabetes Mother    Hyperlipidemia Father    Hypertension Father    Social History   Socioeconomic History   Marital status: Married    Spouse name: Not on file   Number of children: 3   Years of education: Not on file   Highest education level: Not on file  Occupational History   Not on file  Tobacco Use   Smoking status: Every Day    Packs/day: 0.50    Types: Cigarettes   Smokeless tobacco: Never  Vaping Use   Vaping Use: Never used  Substance and Sexual Activity   Alcohol use: Not Currently    Comment: social   Drug use: No   Sexual activity: Not Currently  Other Topics Concern  Not on file  Social History Narrative   Not on file   Social Determinants of Health   Financial Resource Strain: Not on file  Food Insecurity: Not on file  Transportation Needs: Not on file  Physical Activity: Not on file  Stress: Not on file  Social Connections: Not on file    Review of Systems  Constitutional:  Negative for appetite change, chills, fatigue and fever.  HENT:  Positive for congestion, sinus pressure and sore throat. Negative for ear discharge, ear pain, rhinorrhea and sneezing.   Eyes:  Negative for visual disturbance.  Respiratory:  Positive for cough. Negative for chest tightness, shortness of breath and wheezing.   Cardiovascular:   Negative for chest pain, palpitations and leg swelling.  Gastrointestinal:  Negative for abdominal pain, diarrhea, nausea and vomiting.  Endocrine: Negative for polydipsia, polyphagia and polyuria.  Genitourinary:  Negative for difficulty urinating, dysuria, frequency, hematuria, menstrual problem, urgency, vaginal bleeding, vaginal discharge and vaginal pain.  Musculoskeletal:  Negative for back pain, gait problem, joint swelling, myalgias and neck pain.  Neurological:  Negative for dizziness, seizures, syncope, weakness, numbness and headaches.  Psychiatric/Behavioral:  Negative for agitation, confusion, hallucinations, sleep disturbance and suicidal ideas. The patient is not nervous/anxious.     Objective:  BP (!) 144/84    Pulse 68    Temp 98.4 F (36.9 C)    Ht 5\' 5"  (1.651 m)    Wt 158 lb 3.2 oz (71.8 kg)    SpO2 96%    BMI 26.33 kg/m   BP/Weight 03/14/2021 12/20/2020 62/69/4854  Systolic BP 627 035 009  Diastolic BP 84 60 78  Wt. (Lbs) 158.2 156 157  BMI 26.33 25.96 26.13    Physical Exam Vitals reviewed.  Constitutional:      General: She is not in acute distress.    Appearance: Normal appearance. She is well-developed.  HENT:     Right Ear: Tympanic membrane and ear canal normal.     Left Ear: Tympanic membrane and ear canal normal.     Nose: Congestion present.  Eyes:     Extraocular Movements: Extraocular movements intact.     Conjunctiva/sclera: Conjunctivae normal.     Pupils: Pupils are equal, round, and reactive to light.  Cardiovascular:     Rate and Rhythm: Normal rate and regular rhythm.     Pulses: Normal pulses.     Heart sounds: Normal heart sounds. No murmur heard.   No gallop.  Pulmonary:     Effort: Pulmonary effort is normal. No respiratory distress.     Breath sounds: Normal breath sounds. No wheezing.  Abdominal:     General: Bowel sounds are normal. There is no distension.     Palpations: Abdomen is soft.  Musculoskeletal:     Cervical back:  Normal range of motion and neck supple.  Skin:    Capillary Refill: Capillary refill takes less than 2 seconds.  Neurological:     General: No focal deficit present.     Mental Status: She is alert and oriented to person, place, and time.  Psychiatric:        Mood and Affect: Mood normal.        Thought Content: Thought content normal.        Lab Results  Component Value Date   WBC 11.1 (H) 12/20/2020   HGB 12.6 12/20/2020   HCT 37.3 12/20/2020   PLT 218 12/20/2020   GLUCOSE 91 12/20/2020   CHOL 157 12/20/2020  TRIG 229 (H) 12/20/2020   HDL 42 12/20/2020   LDLCALC 77 12/20/2020   ALT 17 12/20/2020   AST 16 12/20/2020   NA 137 12/20/2020   K 4.5 12/20/2020   CL 97 12/20/2020   CREATININE 0.80 12/20/2020   BUN 11 12/20/2020   CO2 26 12/20/2020   TSH 2.160 08/03/2020   HGBA1C 6.2 (H) 12/20/2020   MICROALBUR 30 03/19/2019      Assessment & Plan:   Problem List Items Addressed This Visit   None Visit Diagnoses     Acute upper respiratory infection    -  Primary   Relevant Medications   fluticasone (FLONASE) 50 MCG/ACT nasal spray Patient has allergies and URI symptoms, use claritin D and flonase for alergies   Other Relevant Orders   POC COVID-19 BinaxNow (Completed)- negative   POCT Rapid Strep A (Completed)- negative     .  Meds ordered this encounter  Medications   fluticasone (FLONASE) 50 MCG/ACT nasal spray    Sig: Place 2 sprays into both nostrils daily.    Dispense:  16 g    Refill:  6    Orders Placed This Encounter  Procedures   POC COVID-19 BinaxNow     Follow-up: Return if symptoms worsen or fail to improve.  An After Visit Summary was printed and given to the patient.  Reinaldo Meeker, MD [facility] [phone]

## 2021-03-20 ENCOUNTER — Other Ambulatory Visit: Payer: Self-pay | Admitting: Legal Medicine

## 2021-03-20 DIAGNOSIS — F330 Major depressive disorder, recurrent, mild: Secondary | ICD-10-CM

## 2021-03-20 DIAGNOSIS — H2513 Age-related nuclear cataract, bilateral: Secondary | ICD-10-CM | POA: Diagnosis not present

## 2021-03-20 DIAGNOSIS — H40053 Ocular hypertension, bilateral: Secondary | ICD-10-CM | POA: Diagnosis not present

## 2021-03-20 DIAGNOSIS — H25043 Posterior subcapsular polar age-related cataract, bilateral: Secondary | ICD-10-CM | POA: Diagnosis not present

## 2021-03-20 DIAGNOSIS — H52223 Regular astigmatism, bilateral: Secondary | ICD-10-CM | POA: Diagnosis not present

## 2021-03-20 DIAGNOSIS — H3581 Retinal edema: Secondary | ICD-10-CM | POA: Diagnosis not present

## 2021-03-20 DIAGNOSIS — H5203 Hypermetropia, bilateral: Secondary | ICD-10-CM | POA: Diagnosis not present

## 2021-03-20 LAB — HM DIABETES EYE EXAM

## 2021-03-22 ENCOUNTER — Other Ambulatory Visit: Payer: Self-pay | Admitting: Legal Medicine

## 2021-04-18 ENCOUNTER — Other Ambulatory Visit: Payer: Self-pay | Admitting: Legal Medicine

## 2021-04-18 ENCOUNTER — Other Ambulatory Visit: Payer: Self-pay | Admitting: Family Medicine

## 2021-04-19 ENCOUNTER — Ambulatory Visit: Payer: PPO | Admitting: Legal Medicine

## 2021-04-20 DIAGNOSIS — L820 Inflamed seborrheic keratosis: Secondary | ICD-10-CM | POA: Diagnosis not present

## 2021-04-20 DIAGNOSIS — D1801 Hemangioma of skin and subcutaneous tissue: Secondary | ICD-10-CM | POA: Diagnosis not present

## 2021-04-20 DIAGNOSIS — L718 Other rosacea: Secondary | ICD-10-CM | POA: Diagnosis not present

## 2021-04-20 DIAGNOSIS — Z85828 Personal history of other malignant neoplasm of skin: Secondary | ICD-10-CM | POA: Diagnosis not present

## 2021-04-20 DIAGNOSIS — D2272 Melanocytic nevi of left lower limb, including hip: Secondary | ICD-10-CM | POA: Diagnosis not present

## 2021-04-20 DIAGNOSIS — L570 Actinic keratosis: Secondary | ICD-10-CM | POA: Diagnosis not present

## 2021-04-20 DIAGNOSIS — L821 Other seborrheic keratosis: Secondary | ICD-10-CM | POA: Diagnosis not present

## 2021-04-20 DIAGNOSIS — L814 Other melanin hyperpigmentation: Secondary | ICD-10-CM | POA: Diagnosis not present

## 2021-04-24 NOTE — Progress Notes (Signed)
? ?Subjective:  ?Patient ID: Cassandra Fox, female    DOB: 11-26-1955  Age: 66 y.o. MRN: [id number] ? ?Chief Complaint  ?Patient presents with  ? Hypertension  ? Diabetes  ? ? ?HPI ?Patient present with type 2 diabetes.  Specifically, this is type 2, noninsulin requiring diabetes. Compliance with treatment has been good; patient take medicines as directed, maintains diet and exercise regimen, follows up as directed, and is keeping glucose diary. Patient checks blood sugar at home and range is 108-115 mg/dl. Current medicines for diabetes Metformin 500 mg twice a day.  Patient performs foot exams daily and last ophthalmologic exam was 04/01/2019.  Last A1C was 6.2% ? ?Patient presents with hyperlipidemia.  Compliance with treatment has been good; patient takes medicines as directed, maintains low cholesterol diet, follows up as directed, and maintains exercise regimen.  Patient is using Simvastatin 20 mg daily without problems.  ? ?Patient presents for follow up of hypertension.  Patient tolerating Aspirin 81 mg daily, Losartan/HCTZ 100-25 mg at bedtime and Metoprolol 25 mg daily well without side effects.  Patient was diagnosed with hypertension and has been treated for hypertension for 5 years.Patient is working on maintaining diet and exercise regimen and follows up as directed.  ? ?Hypothyroidism: Patient is taking Levothyroxine 75 mcg daily. ? ?Depression: She is taking citalopram 40 mg daily. ? ?Current Outpatient Medications on File Prior to Visit  ?Medication Sig Dispense Refill  ? ALPRAZolam (XANAX) 0.5 MG tablet TAKE 1 TABLET(0.5 MG) BY MOUTH AT BEDTIME AS NEEDED FOR ANXIETY 30 tablet 3  ? aspirin EC 81 MG tablet Take 81 mg by mouth daily.    ? citalopram (CELEXA) 40 MG tablet TAKE 1 TABLET BY MOUTH EVERY DAY 30 tablet 3  ? clobetasol cream (TEMOVATE) 7.41 % Apply 1 application topically 2 (two) times daily. 30 g 0  ? fluticasone (FLONASE) 50 MCG/ACT nasal spray Place 2 sprays into both nostrils daily. 16  g 6  ? levothyroxine (SYNTHROID) 75 MCG tablet TAKE 1 TABLET(75 MCG) BY MOUTH EVERY DAY 90 tablet 1  ? losartan-hydrochlorothiazide (HYZAAR) 100-25 MG tablet TAKE 1 TABLET BY MOUTH DAILY 90 tablet 2  ? metoprolol tartrate (LOPRESSOR) 25 MG tablet TAKE 1 TABLET(25 MG) BY MOUTH DAILY 90 tablet 2  ? ONETOUCH VERIO test strip USE TO CHECK BLOOD SUGAR TWICE DAILY 100 strip 4  ? simvastatin (ZOCOR) 20 MG tablet TAKE 1 TABLET BY MOUTH AT BEDTIME 90 tablet 2  ? triamcinolone (NASACORT) 55 MCG/ACT AERO nasal inhaler 1 puff in each nostril    ? Vitamin D, Cholecalciferol, 1000 units CAPS Take by mouth.    ? Zinc Sulfate (ZINC 15 PO) Take by mouth.    ? ?No current facility-administered medications on file prior to visit.  ? ?Past Medical History:  ?Diagnosis Date  ? Anxiety   ? Breast mass, right   ? Depression   ? Diabetes mellitus without complication ([place])   ? Hyperlipidemia   ? Hypertension   ? Hypothyroidism   ? ?Past Surgical History:  ?Procedure Laterality Date  ? BREAST EXCISIONAL BIOPSY Right   ? benign  ? BREAST LUMPECTOMY WITH RADIOACTIVE SEED LOCALIZATION Right 09/23/2015  ? Procedure: RIGHT BREAST LUMPECTOMY WITH RADIOACTIVE SEED LOCALIZATION;  Surgeon: Excell Seltzer, MD;  Location: [facility];  Service: General;  Laterality: Right;  ?  ?Family History  ?Problem Relation Age of Onset  ? Hyperlipidemia Mother   ? Diabetes Mother   ? Hyperlipidemia Father   ? Hypertension  Father   ? ?Social History  ? ?Socioeconomic History  ? Marital status: Married  ?  Spouse name: Not on file  ? Number of children: 3  ? Years of education: Not on file  ? Highest education level: Not on file  ?Occupational History  ? Not on file  ?Tobacco Use  ? Smoking status: Every Day  ?  Packs/day: 0.50  ?  Types: Cigarettes  ? Smokeless tobacco: Never  ?Vaping Use  ? Vaping Use: Never used  ?Substance and Sexual Activity  ? Alcohol use: Not Currently  ?  Comment: social  ? Drug use: No  ? Sexual activity: Not Currently   ?Other Topics Concern  ? Not on file  ?Social History Narrative  ? Not on file  ? ?Social Determinants of Health  ? ?Financial Resource Strain: Not on file  ?Food Insecurity: Not on file  ?Transportation Needs: Not on file  ?Physical Activity: Not on file  ?Stress: Not on file  ?Social Connections: Not on file  ? ? ?Review of Systems  ?Constitutional:  Negative for chills, fatigue and fever.  ?HENT:  Negative for congestion, ear pain and sore throat.   ?Respiratory:  Negative for cough and shortness of breath.   ?Cardiovascular:  Negative for chest pain and palpitations.  ?Gastrointestinal:  Negative for abdominal pain, constipation, diarrhea, nausea and vomiting.  ?Endocrine: Negative for polydipsia, polyphagia and polyuria.  ?Genitourinary:  Negative for difficulty urinating and dysuria.  ?Musculoskeletal:  Negative for arthralgias, back pain and myalgias.  ?Skin:  Negative for rash.  ?Neurological:  Negative for headaches.  ?Psychiatric/Behavioral:  Negative for dysphoric mood. The patient is not nervous/anxious.   ? ? ?Objective:  ?BP 140/60   Pulse 62   Temp 98.4 ?F (36.9 ?C)   Resp 15   Ht '5\' 5"'$  (1.651 m)   Wt 160 lb (72.6 kg)   SpO2 97%   BMI 26.63 kg/m?  ? ? ?  04/25/2021  ?  9:32 AM 03/14/2021  ?  2:32 PM 12/20/2020  ?  9:53 AM  ?BP/Weight  ?Systolic BP 696 295 284  ?Diastolic BP 60 84 60  ?Wt. (Lbs) 160 158.2 156  ?BMI 26.63 kg/m2 26.33 kg/m2 25.96 kg/m2  ? ? ?Physical Exam ?Vitals reviewed.  ?Constitutional:   ?   General: She is not in acute distress. ?   Appearance: Normal appearance. She is obese.  ?HENT:  ?   Head: Normocephalic.  ?   Right Ear: Tympanic membrane normal.  ?   Left Ear: Tympanic membrane normal.  ?   Mouth/Throat:  ?   Mouth: Mucous membranes are moist.  ?   Pharynx: Oropharynx is clear.  ?Eyes:  ?   Conjunctiva/sclera: Conjunctivae normal.  ?   Pupils: Pupils are equal, round, and reactive to light.  ?Cardiovascular:  ?   Rate and Rhythm: Normal rate and regular rhythm.  ?    Pulses: Normal pulses.  ?   Heart sounds: Normal heart sounds. No murmur heard. ?  No gallop.  ?Pulmonary:  ?   Effort: Pulmonary effort is normal. No respiratory distress.  ?   Breath sounds: Normal breath sounds. No wheezing.  ?Abdominal:  ?   General: Abdomen is flat. Bowel sounds are normal. There is no distension.  ?   Palpations: Abdomen is soft.  ?   Tenderness: There is no abdominal tenderness.  ?Musculoskeletal:  ?   Cervical back: Normal range of motion.  ?Neurological:  ?   Mental  Status: She is alert.  ? ? ?Diabetic Foot Exam - Simple   ?Simple Foot Form ?Diabetic Foot exam was performed with the following findings: Yes 04/25/2021 10:16 AM  ?Visual Inspection ?No deformities, no ulcerations, no other skin breakdown bilaterally: Yes ?Sensation Testing ?Intact to touch and monofilament testing bilaterally: Yes ?Pulse Check ?Posterior Tibialis and Dorsalis pulse intact bilaterally: Yes ?Comments ?  ?  ? ?Lab Results  ?Component Value Date  ? WBC 11.1 (H) 12/20/2020  ? HGB 12.6 12/20/2020  ? HCT 37.3 12/20/2020  ? PLT 218 12/20/2020  ? GLUCOSE 91 12/20/2020  ? CHOL 157 12/20/2020  ? TRIG 229 (H) 12/20/2020  ? HDL 42 12/20/2020  ? [place] 77 12/20/2020  ? ALT 17 12/20/2020  ? AST 16 12/20/2020  ? NA 137 12/20/2020  ? K 4.5 12/20/2020  ? CL 97 12/20/2020  ? CREATININE 0.80 12/20/2020  ? BUN 11 12/20/2020  ? CO2 26 12/20/2020  ? TSH 2.160 08/03/2020  ? HGBA1C 6.2 (H) 12/20/2020  ? MICROALBUR 30 03/19/2019  ? ? ?  04/25/2021  ? 10:07 AM 03/14/2021  ?  2:37 PM 03/14/2021  ?  2:35 PM 11/14/2020  ?  1:41 PM 08/03/2020  ? 10:10 AM  ?Depression screen PHQ 2/9  ?Decreased Interest 0 0 0 0 0  ?Down, Depressed, Hopeless 1 0 0 0 0  ?PHQ - 2 Score 1 0 0 0 0  ?Altered sleeping 1 0  0 0  ?Tired, decreased energy 0 0  0 0  ?Change in appetite 0 0  0 0  ?Feeling bad or failure about yourself  0 0  0 0  ?Trouble concentrating 0 0  0 0  ?Moving slowly or fidgety/restless 0 0  0 0  ?Suicidal thoughts 0 0  0 0  ?PHQ-9 Score 2 0  0 0   ?Difficult doing work/chores Not difficult at all   Not difficult at all Not difficult at all  ? ? ? ? ?Assessment & Plan:  ? ?Problem List Items Addressed This Visit   ? ?  ? Cardiovascular and Mediastinum  ? Obesity

## 2021-04-25 ENCOUNTER — Ambulatory Visit (INDEPENDENT_AMBULATORY_CARE_PROVIDER_SITE_OTHER): Payer: PPO | Admitting: Legal Medicine

## 2021-04-25 ENCOUNTER — Encounter: Payer: Self-pay | Admitting: Legal Medicine

## 2021-04-25 VITALS — BP 140/60 | HR 62 | Temp 98.4°F | Resp 15 | Ht 65.0 in | Wt 160.0 lb

## 2021-04-25 DIAGNOSIS — E1169 Type 2 diabetes mellitus with other specified complication: Secondary | ICD-10-CM | POA: Diagnosis not present

## 2021-04-25 DIAGNOSIS — E039 Hypothyroidism, unspecified: Secondary | ICD-10-CM

## 2021-04-25 DIAGNOSIS — R110 Nausea: Secondary | ICD-10-CM | POA: Diagnosis not present

## 2021-04-25 DIAGNOSIS — F419 Anxiety disorder, unspecified: Secondary | ICD-10-CM

## 2021-04-25 DIAGNOSIS — I152 Hypertension secondary to endocrine disorders: Secondary | ICD-10-CM | POA: Diagnosis not present

## 2021-04-25 DIAGNOSIS — E1159 Type 2 diabetes mellitus with other circulatory complications: Secondary | ICD-10-CM

## 2021-04-25 DIAGNOSIS — F330 Major depressive disorder, recurrent, mild: Secondary | ICD-10-CM | POA: Diagnosis not present

## 2021-04-25 DIAGNOSIS — I10 Essential (primary) hypertension: Secondary | ICD-10-CM

## 2021-04-25 DIAGNOSIS — E119 Type 2 diabetes mellitus without complications: Secondary | ICD-10-CM

## 2021-04-25 DIAGNOSIS — Z6826 Body mass index (BMI) 26.0-26.9, adult: Secondary | ICD-10-CM

## 2021-04-25 DIAGNOSIS — E669 Obesity, unspecified: Secondary | ICD-10-CM

## 2021-04-25 DIAGNOSIS — E782 Mixed hyperlipidemia: Secondary | ICD-10-CM

## 2021-04-25 MED ORDER — TIRZEPATIDE 2.5 MG/0.5ML ~~LOC~~ SOAJ: 2.5000 mg | SUBCUTANEOUS | 3 refills | Status: DC

## 2021-04-25 MED ORDER — ONDANSETRON HCL 4 MG PO TABS: 4.0000 mg | ORAL_TABLET | Freq: Three times a day (TID) | ORAL | 0 refills | Status: DC | PRN

## 2021-04-26 ENCOUNTER — Telehealth: Payer: Self-pay

## 2021-04-26 NOTE — Telephone Encounter (Signed)
Patient wanting to confirm rather or not she is suppose to continue taking metformin since starting Mounjaro. Stated she "understood that she was to d/c metformin for the fear that taking it with Mounjaro would drop her sugar too much."  ?

## 2021-04-26 NOTE — Telephone Encounter (Signed)
Stop metformin ?lp ?

## 2021-04-27 NOTE — Telephone Encounter (Signed)
Patient informed and expressed understanding

## 2021-04-28 LAB — COMPREHENSIVE METABOLIC PANEL
ALT: 14 IU/L (ref 0–32)
AST: 16 IU/L (ref 0–40)
Albumin/Globulin Ratio: 1.3 (ref 1.2–2.2)
Albumin: 4.1 g/dL (ref 3.8–4.8)
Alkaline Phosphatase: 85 IU/L (ref 44–121)
BUN/Creatinine Ratio: 13 (ref 12–28)
BUN: 12 mg/dL (ref 8–27)
Bilirubin Total: 0.3 mg/dL (ref 0.0–1.2)
CO2: 24 mmol/L (ref 20–29)
Calcium: 9.5 mg/dL (ref 8.7–10.3)
Chloride: 94 mmol/L — ABNORMAL LOW (ref 96–106)
Creatinine, Ser: 0.9 mg/dL (ref 0.57–1.00)
Globulin, Total: 3.2 g/dL (ref 1.5–4.5)
Glucose: 87 mg/dL (ref 70–99)
Potassium: 4 mmol/L (ref 3.5–5.2)
Sodium: 133 mmol/L — ABNORMAL LOW (ref 134–144)
Total Protein: 7.3 g/dL (ref 6.0–8.5)
eGFR: 70 mL/min/{1.73_m2} (ref >59)

## 2021-04-28 LAB — CBC WITH DIFFERENTIAL/PLATELET
Basophils Absolute: 0.1 10*3/uL (ref 0.0–0.2)
Basos: 1 %
EOS (ABSOLUTE): 0.4 10*3/uL (ref 0.0–0.4)
Eos: 4 %
Hematocrit: 38.5 % (ref 34.0–46.6)
Hemoglobin: 13 g/dL (ref 11.1–15.9)
Immature Grans (Abs): 0.1 10*3/uL (ref 0.0–0.1)
Immature Granulocytes: 1 %
Lymphocytes Absolute: 2.7 10*3/uL (ref 0.7–3.1)
Lymphs: 25 %
MCH: 29.4 pg (ref 26.6–33.0)
MCHC: 33.8 g/dL (ref 31.5–35.7)
MCV: 87 fL (ref 79–97)
Monocytes Absolute: 1.1 10*3/uL — ABNORMAL HIGH (ref 0.1–0.9)
Monocytes: 11 %
Neutrophils Absolute: 6.3 10*3/uL (ref 1.4–7.0)
Neutrophils: 58 %
Platelets: 193 10*3/uL (ref 150–450)
RBC: 4.42 x10E6/uL (ref 3.77–5.28)
RDW: 12.8 % (ref 11.7–15.4)
WBC: 10.7 10*3/uL (ref 3.4–10.8)

## 2021-04-28 LAB — LIPID PANEL
Chol/HDL Ratio: 3.6 ratio (ref 0.0–4.4)
Cholesterol, Total: 143 mg/dL (ref 100–199)
HDL: 40 mg/dL (ref >39)
LDL Chol Calc (NIH): 67 mg/dL (ref 0–99)
Triglycerides: 221 mg/dL — ABNORMAL HIGH (ref 0–149)
VLDL Cholesterol Cal: 36 mg/dL (ref 5–40)

## 2021-04-28 LAB — HEMOGLOBIN A1C
Est. average glucose Bld gHb Est-mCnc: 126 mg/dL
Hgb A1c MFr Bld: 6 % — ABNORMAL HIGH (ref 4.8–5.6)

## 2021-04-28 LAB — MICROALBUMIN / CREATININE URINE RATIO
Creatinine, Urine: 103.2 mg/dL
Microalb/Creat Ratio: 12 mg/g creat (ref 0–29)
Microalbumin, Urine: 12.5 ug/mL

## 2021-04-28 LAB — CARDIOVASCULAR RISK ASSESSMENT

## 2021-04-28 LAB — TSH: TSH: 2.89 u[IU]/mL (ref 0.450–4.500)

## 2021-04-29 NOTE — Progress Notes (Signed)
A1c 6.0, triglycerides high 221, kidney and liver tests normal, CBC normal, Microalbuminuria normal, TSH 2.89 normal ?lp

## 2021-05-09 DIAGNOSIS — H35712 Central serous chorioretinopathy, left eye: Secondary | ICD-10-CM | POA: Diagnosis not present

## 2021-05-09 DIAGNOSIS — H353132 Nonexudative age-related macular degeneration, bilateral, intermediate dry stage: Secondary | ICD-10-CM | POA: Diagnosis not present

## 2021-05-09 DIAGNOSIS — H35033 Hypertensive retinopathy, bilateral: Secondary | ICD-10-CM | POA: Diagnosis not present

## 2021-05-09 DIAGNOSIS — H2513 Age-related nuclear cataract, bilateral: Secondary | ICD-10-CM | POA: Diagnosis not present

## 2021-06-01 ENCOUNTER — Ambulatory Visit
Admission: RE | Admit: 2021-06-01 | Discharge: 2021-06-01 | Disposition: A | Discharge status: Home / Self Care | Payer: PPO | Source: Ambulatory Visit | Attending: Nurse Practitioner | Admitting: Nurse Practitioner

## 2021-06-01 DIAGNOSIS — Z1231 Encounter for screening mammogram for malignant neoplasm of breast: Secondary | ICD-10-CM

## 2021-06-05 ENCOUNTER — Other Ambulatory Visit: Payer: Self-pay | Admitting: Nurse Practitioner

## 2021-06-05 DIAGNOSIS — R928 Other abnormal and inconclusive findings on diagnostic imaging of breast: Secondary | ICD-10-CM

## 2021-06-13 ENCOUNTER — Ambulatory Visit
Admission: RE | Admit: 2021-06-13 | Discharge: 2021-06-13 | Disposition: A | Discharge status: Home / Self Care | Payer: PPO | Source: Ambulatory Visit | Attending: Nurse Practitioner | Admitting: Nurse Practitioner

## 2021-06-13 DIAGNOSIS — R928 Other abnormal and inconclusive findings on diagnostic imaging of breast: Secondary | ICD-10-CM

## 2021-06-13 DIAGNOSIS — R922 Inconclusive mammogram: Secondary | ICD-10-CM | POA: Diagnosis not present

## 2021-06-13 DIAGNOSIS — N6489 Other specified disorders of breast: Secondary | ICD-10-CM | POA: Diagnosis not present

## 2021-06-13 NOTE — Progress Notes (Signed)
Diagnostic mammogram and ultrasound breast- no abnormality lp

## 2021-06-14 ENCOUNTER — Other Ambulatory Visit: Payer: Self-pay | Admitting: Legal Medicine

## 2021-06-15 ENCOUNTER — Other Ambulatory Visit: Payer: Self-pay | Admitting: Legal Medicine

## 2021-06-15 DIAGNOSIS — F330 Major depressive disorder, recurrent, mild: Secondary | ICD-10-CM

## 2021-06-21 DIAGNOSIS — H40053 Ocular hypertension, bilateral: Secondary | ICD-10-CM | POA: Diagnosis not present

## 2021-06-21 DIAGNOSIS — H5203 Hypermetropia, bilateral: Secondary | ICD-10-CM | POA: Diagnosis not present

## 2021-06-21 DIAGNOSIS — H52223 Regular astigmatism, bilateral: Secondary | ICD-10-CM | POA: Diagnosis not present

## 2021-06-21 DIAGNOSIS — H25043 Posterior subcapsular polar age-related cataract, bilateral: Secondary | ICD-10-CM | POA: Diagnosis not present

## 2021-06-21 DIAGNOSIS — H2513 Age-related nuclear cataract, bilateral: Secondary | ICD-10-CM | POA: Diagnosis not present

## 2021-06-21 DIAGNOSIS — H3581 Retinal edema: Secondary | ICD-10-CM | POA: Diagnosis not present

## 2021-07-11 ENCOUNTER — Other Ambulatory Visit: Payer: Self-pay | Admitting: Family Medicine

## 2021-07-12 ENCOUNTER — Other Ambulatory Visit: Payer: Self-pay | Admitting: Legal Medicine

## 2021-07-12 DIAGNOSIS — F330 Major depressive disorder, recurrent, mild: Secondary | ICD-10-CM

## 2021-08-17 ENCOUNTER — Ambulatory Visit
Admission: RE | Admit: 2021-08-17 | Discharge: 2021-08-17 | Disposition: A | Discharge status: Home / Self Care | Payer: PPO | Source: Ambulatory Visit | Attending: Nurse Practitioner | Admitting: Nurse Practitioner

## 2021-08-17 DIAGNOSIS — Z780 Asymptomatic menopausal state: Secondary | ICD-10-CM | POA: Diagnosis not present

## 2021-08-17 DIAGNOSIS — M85851 Other specified disorders of bone density and structure, right thigh: Secondary | ICD-10-CM | POA: Diagnosis not present

## 2021-08-30 ENCOUNTER — Other Ambulatory Visit: Payer: Self-pay | Admitting: Legal Medicine

## 2021-09-13 ENCOUNTER — Other Ambulatory Visit: Payer: Self-pay | Admitting: Legal Medicine

## 2021-09-13 DIAGNOSIS — F330 Major depressive disorder, recurrent, mild: Secondary | ICD-10-CM

## 2021-09-20 ENCOUNTER — Ambulatory Visit: Payer: PPO | Admitting: Physician Assistant

## 2021-10-11 ENCOUNTER — Ambulatory Visit (INDEPENDENT_AMBULATORY_CARE_PROVIDER_SITE_OTHER): Payer: PPO | Admitting: Physician Assistant

## 2021-10-11 ENCOUNTER — Encounter: Payer: Self-pay | Admitting: Physician Assistant

## 2021-10-11 VITALS — BP 130/70 | HR 60 | Temp 97.2°F | Ht 65.0 in | Wt 157.4 lb

## 2021-10-11 DIAGNOSIS — F330 Major depressive disorder, recurrent, mild: Secondary | ICD-10-CM | POA: Diagnosis not present

## 2021-10-11 DIAGNOSIS — E782 Mixed hyperlipidemia: Secondary | ICD-10-CM | POA: Diagnosis not present

## 2021-10-11 DIAGNOSIS — I10 Essential (primary) hypertension: Secondary | ICD-10-CM | POA: Diagnosis not present

## 2021-10-11 DIAGNOSIS — E785 Hyperlipidemia, unspecified: Secondary | ICD-10-CM | POA: Diagnosis not present

## 2021-10-11 DIAGNOSIS — E559 Vitamin D deficiency, unspecified: Secondary | ICD-10-CM

## 2021-10-11 DIAGNOSIS — E1169 Type 2 diabetes mellitus with other specified complication: Secondary | ICD-10-CM

## 2021-10-11 DIAGNOSIS — E039 Hypothyroidism, unspecified: Secondary | ICD-10-CM | POA: Diagnosis not present

## 2021-10-11 NOTE — Progress Notes (Signed)
Subjective:  Patient ID: Cassandra Fox, female    DOB: 1955-01-24  Age: 66 y.o. MRN: [id number]  Chief Complaint  Patient presents with   Diabetes   Hypothyroidism    HPI  Pt presents for follow up of hypertension. The patient is tolerating the medication well without side effects. Compliance with treatment has been good; including taking medication as directed , maintains a healthy diet and regular exercise regimen , and following up as directed. Pt currently taking hyzaar 100/25 and metoprolol '25mg'$    Pt with history of hypothyroidism - currently on synthroid 64mg  Pt with history of NIDDM - currently on glucophage '500mg'$  bid  -sugars have been in good control She is up to date with eye exam - voices no problems or concerns  Pt with history of depression with anxiety - currently taking celexa '40mg'$  qd and xanax as needed -   Mixed hyperlipidemia  Pt presents with hyperlipidemia.  Compliance with treatment has been good The patient is compliant with medications, maintains a low cholesterol diet , follows up as directed , and maintains an exercise regimen . The patient denies experiencing any hypercholesterolemia related symptoms. Currently on zocor '20mg'$  qd  Pt takes otc vit D supplements - due for labwork   Current Outpatient Medications on File Prior to Visit  Medication Sig Dispense Refill   ALPRAZolam (XANAX) 0.5 MG tablet TAKE 1 TABLET(0.5 MG) BY MOUTH AT BEDTIME AS NEEDED FOR ANXIETY 30 tablet 3   aspirin EC 81 MG tablet Take 81 mg by mouth daily.     citalopram (CELEXA) 40 MG tablet TAKE 1 TABLET BY MOUTH EVERY DAY 30 tablet 3   clobetasol cream (TEMOVATE) 04.09% Apply 1 application topically 2 (two) times daily. 30 g 0   fluticasone (FLONASE) 50 MCG/ACT nasal spray Place 2 sprays into both nostrils daily. 16 g 6   glucose blood (ONETOUCH VERIO) test strip USE TO CHECK BLOOD GLUCOSE TWICE DAILY 100 strip 4   levothyroxine (SYNTHROID) 75 MCG tablet TAKE 1 TABLET(75 MCG) BY  MOUTH EVERY DAY 90 tablet 1   losartan-hydrochlorothiazide (HYZAAR) 100-25 MG tablet TAKE 1 TABLET BY MOUTH DAILY 90 tablet 2   metoprolol tartrate (LOPRESSOR) 25 MG tablet TAKE 1 TABLET(25 MG) BY MOUTH DAILY 90 tablet 2   ondansetron (ZOFRAN) 4 MG tablet Take 1 tablet (4 mg total) by mouth every 8 (eight) hours as needed for nausea or vomiting. 20 tablet 0   simvastatin (ZOCOR) 20 MG tablet TAKE 1 TABLET BY MOUTH AT BEDTIME 90 tablet 2   Vitamin D, Cholecalciferol, 1000 units CAPS Take by mouth.     Zinc Sulfate (ZINC 15 PO) Take by mouth.     metFORMIN (GLUCOPHAGE) 500 MG tablet Take 500 mg by mouth 2 (two) times daily.     No current facility-administered medications on file prior to visit.   Past Medical History:  Diagnosis Date   Anxiety    Breast mass, right    Depression    [place]    Hyperlipidemia    Hypertension    Hypothyroidism    Past Surgical History:  Procedure Laterality Date   BREAST EXCISIONAL BIOPSY Right    benign   BREAST LUMPECTOMY WITH RADIOACTIVE SEED LOCALIZATION Right 09/23/2015   Procedure: RIGHT BREAST LUMPECTOMY WITH RADIOACTIVE SEED LOCALIZATION;  Surgeon: BExcell Seltzer MD;  Location: [facility]  Service: General;  Laterality: Right;    Family History  Problem Relation Age of Onset  Hyperlipidemia Mother    Diabetes Mother    Hyperlipidemia Father    Hypertension Father    Social History   Socioeconomic History   Marital status: Married    Spouse name: Not on file   Number of children: 3   Years of education: Not on file   Highest education level: Not on file  Occupational History   Not on file  Tobacco Use   Smoking status: Every Day    Packs/day: 0.50    Types: Cigarettes   Smokeless tobacco: Never  Vaping Use   Vaping Use: Never used  Substance and Sexual Activity   Alcohol use: Not Currently    Comment: social   Drug use: No   Sexual activity: Not Currently  Other Topics  Concern   Not on file  Social History Narrative   Not on file   Social Determinants of Health   Financial Resource Strain: Not on file  Food Insecurity: Not on file  Transportation Needs: Not on file  Physical Activity: Not on file  Stress: Not on file  Social Connections: Not on file    Review of Systems  CONSTITUTIONAL: Negative for chills, fatigue, fever, unintentional weight gain and unintentional weight loss.  E/N/T: Negative for ear pain, nasal congestion and sore throat.  CARDIOVASCULAR: Negative for chest pain, dizziness, palpitations and pedal edema.  RESPIRATORY: Negative for recent cough and dyspnea.  GASTROINTESTINAL: Negative for abdominal pain, acid reflux symptoms, constipation, diarrhea, nausea and vomiting.  MSK: Negative for arthralgias and myalgias.  INTEGUMENTARY: Negative for rash.  NEUROLOGICAL: Negative for dizziness and headaches.  PSYCHIATRIC: Negative for sleep disturbance and to question depression screen.  Negative for depression, negative for anhedonia.      Objective:  PHYSICAL EXAM:   VS: BP 130/70 (BP Location: Left Arm, Patient Position: Sitting, Cuff Size: Normal)   Pulse 60   Temp (!) 97.2 F (36.2 C) (Temporal)   Ht '5\' 5"'$  (1.651 m)   Wt 157 lb 6.4 oz (71.4 kg)   SpO2 96%   BMI 26.19 kg/m   GEN: Well nourished, well developed, in no acute distress  Cardiac: RRR; no murmurs, rubs, or gallops,no edema -  Respiratory:  normal respiratory rate and pattern with no distress - normal breath sounds with no rales, rhonchi, wheezes or rubs GI: normal bowel sounds, no masses or tenderness  Skin: warm and dry, no rash   Psych: euthymic mood, appropriate affect and demeanor   Lab Results  Component Value Date   WBC 10.7 04/25/2021   HGB 13.0 04/25/2021   HCT 38.5 04/25/2021   PLT 193 04/25/2021   GLUCOSE 87 04/25/2021   CHOL 143 04/25/2021   TRIG 221 (H) 04/25/2021   HDL 40 04/25/2021   LDLCALC 67 04/25/2021   ALT 14 04/25/2021    AST 16 04/25/2021   NA 133 (L) 04/25/2021   K 4.0 04/25/2021   CL 94 (L) 04/25/2021   CREATININE 0.90 04/25/2021   BUN 12 04/25/2021   CO2 24 04/25/2021   TSH 2.890 04/25/2021   HGBA1C 6.0 (H) 04/25/2021   MICROALBUR 30 03/19/2019      Assessment & Plan:   Problem List Items Addressed This Visit       Cardiovascular and Mediastinum   Benign hypertension - Primary   Relevant Orders   CBC with Differential/Platelet   Comprehensive metabolic panel Continue meds     Endocrine   Hypothyroidism, adult   Relevant Orders   TSH Continue med  Other   Mixed hyperlipidemia   Relevant Orders   Lipid panel Watch diet Continue med   Major depressive disorder, recurrent episode, mild ([place]) Continue med   Other Visit Diagnoses     Type 2 diabetes mellitus with hyperlipidemia ([place])       Relevant Medications   metFORMIN (GLUCOPHAGE) 500 MG tablet   Other Relevant Orders   CBC with Differential/Platelet   Comprehensive metabolic panel   Hemoglobin A1c   Vitamin D insufficiency       Relevant Orders   VITAMIN D 25 Hydroxy (Vit-D Deficiency, Fractures)     .  No orders of the defined types were placed in this encounter.   Orders Placed This Encounter  Procedures   CBC with Differential/Platelet   Comprehensive metabolic panel   TSH   Lipid panel   Hemoglobin A1c   VITAMIN D 25 Hydroxy (Vit-D Deficiency, Fractures)     Follow-up: Return in about 4 months (around 02/10/2022) for chronic fasting follow up.  An After Visit Summary was printed and given to the patient.  Yetta Flock [facility] [phone]

## 2021-10-12 ENCOUNTER — Encounter: Payer: Self-pay | Admitting: Physician Assistant

## 2021-10-12 DIAGNOSIS — L570 Actinic keratosis: Secondary | ICD-10-CM | POA: Diagnosis not present

## 2021-10-12 DIAGNOSIS — Z85828 Personal history of other malignant neoplasm of skin: Secondary | ICD-10-CM | POA: Diagnosis not present

## 2021-10-12 DIAGNOSIS — M71341 Other bursal cyst, right hand: Secondary | ICD-10-CM | POA: Diagnosis not present

## 2021-10-12 LAB — CBC WITH DIFFERENTIAL/PLATELET
Basophils Absolute: 0.1 10*3/uL (ref 0.0–0.2)
Basos: 1 %
EOS (ABSOLUTE): 0.5 10*3/uL — ABNORMAL HIGH (ref 0.0–0.4)
Eos: 5 %
Hematocrit: 37.1 % (ref 34.0–46.6)
Hemoglobin: 12.7 g/dL (ref 11.1–15.9)
Immature Grans (Abs): 0 10*3/uL (ref 0.0–0.1)
Immature Granulocytes: 0 %
Lymphocytes Absolute: 3 10*3/uL (ref 0.7–3.1)
Lymphs: 27 %
MCH: 30.2 pg (ref 26.6–33.0)
MCHC: 34.2 g/dL (ref 31.5–35.7)
MCV: 88 fL (ref 79–97)
Monocytes Absolute: 1 10*3/uL — ABNORMAL HIGH (ref 0.1–0.9)
Monocytes: 9 %
Neutrophils Absolute: 6.4 10*3/uL (ref 1.4–7.0)
Neutrophils: 58 %
Platelets: 149 10*3/uL — ABNORMAL LOW (ref 150–450)
RBC: 4.21 x10E6/uL (ref 3.77–5.28)
RDW: 12.5 % (ref 11.7–15.4)
WBC: 11.2 10*3/uL — ABNORMAL HIGH (ref 3.4–10.8)

## 2021-10-12 LAB — CARDIOVASCULAR RISK ASSESSMENT

## 2021-10-12 LAB — COMPREHENSIVE METABOLIC PANEL
ALT: 16 IU/L (ref 0–32)
AST: 20 IU/L (ref 0–40)
Albumin/Globulin Ratio: 1.5 (ref 1.2–2.2)
Albumin: 4.1 g/dL (ref 3.9–4.9)
Alkaline Phosphatase: 81 IU/L (ref 44–121)
BUN/Creatinine Ratio: 11 — ABNORMAL LOW (ref 12–28)
BUN: 9 mg/dL (ref 8–27)
Bilirubin Total: 0.4 mg/dL (ref 0.0–1.2)
CO2: 23 mmol/L (ref 20–29)
Calcium: 9.4 mg/dL (ref 8.7–10.3)
Chloride: 93 mmol/L — ABNORMAL LOW (ref 96–106)
Creatinine, Ser: 0.8 mg/dL (ref 0.57–1.00)
Globulin, Total: 2.8 g/dL (ref 1.5–4.5)
Glucose: 92 mg/dL (ref 70–99)
Potassium: 4 mmol/L (ref 3.5–5.2)
Sodium: 132 mmol/L — ABNORMAL LOW (ref 134–144)
Total Protein: 6.9 g/dL (ref 6.0–8.5)
eGFR: 80 mL/min/{1.73_m2} (ref >59)

## 2021-10-12 LAB — VITAMIN D 25 HYDROXY (VIT D DEFICIENCY, FRACTURES): Vit D, 25-Hydroxy: 50.3 ng/mL (ref 30.0–100.0)

## 2021-10-12 LAB — HEMOGLOBIN A1C
Est. average glucose Bld gHb Est-mCnc: 134 mg/dL
Hgb A1c MFr Bld: 6.3 % — ABNORMAL HIGH (ref 4.8–5.6)

## 2021-10-12 LAB — TSH: TSH: 1.77 u[IU]/mL (ref 0.450–4.500)

## 2021-10-12 LAB — LIPID PANEL
Chol/HDL Ratio: 3.4 ratio (ref 0.0–4.4)
Cholesterol, Total: 146 mg/dL (ref 100–199)
HDL: 43 mg/dL (ref >39)
LDL Chol Calc (NIH): 69 mg/dL (ref 0–99)
Triglycerides: 209 mg/dL — ABNORMAL HIGH (ref 0–149)
VLDL Cholesterol Cal: 34 mg/dL (ref 5–40)

## 2021-10-18 ENCOUNTER — Other Ambulatory Visit: Payer: Self-pay | Admitting: Legal Medicine

## 2021-10-18 DIAGNOSIS — J069 Acute upper respiratory infection, unspecified: Secondary | ICD-10-CM

## 2021-11-01 ENCOUNTER — Other Ambulatory Visit: Payer: Self-pay | Admitting: Legal Medicine

## 2021-11-01 DIAGNOSIS — E669 Obesity, unspecified: Secondary | ICD-10-CM

## 2021-11-08 ENCOUNTER — Other Ambulatory Visit: Payer: Self-pay | Admitting: Legal Medicine

## 2021-11-08 DIAGNOSIS — F330 Major depressive disorder, recurrent, mild: Secondary | ICD-10-CM

## 2021-11-09 DIAGNOSIS — Z800 Family history of malignant neoplasm of digestive organs: Secondary | ICD-10-CM | POA: Diagnosis not present

## 2021-11-09 DIAGNOSIS — Z86010 Personal history of colonic polyps: Secondary | ICD-10-CM | POA: Diagnosis not present

## 2021-11-11 ENCOUNTER — Other Ambulatory Visit: Payer: Self-pay | Admitting: Legal Medicine

## 2021-11-14 ENCOUNTER — Other Ambulatory Visit: Payer: Self-pay | Admitting: Physician Assistant

## 2021-11-14 MED ORDER — METOPROLOL TARTRATE 25 MG PO TABS: ORAL_TABLET | ORAL | 1 refills | Status: DC

## 2021-11-14 MED ORDER — SIMVASTATIN 20 MG PO TABS: 20.0000 mg | ORAL_TABLET | Freq: Every day | ORAL | 1 refills | Status: DC

## 2021-11-20 DIAGNOSIS — H35712 Central serous chorioretinopathy, left eye: Secondary | ICD-10-CM | POA: Diagnosis not present

## 2021-11-20 DIAGNOSIS — H35371 Puckering of macula, right eye: Secondary | ICD-10-CM | POA: Diagnosis not present

## 2021-11-20 DIAGNOSIS — H35033 Hypertensive retinopathy, bilateral: Secondary | ICD-10-CM | POA: Diagnosis not present

## 2021-11-20 DIAGNOSIS — H353132 Nonexudative age-related macular degeneration, bilateral, intermediate dry stage: Secondary | ICD-10-CM | POA: Diagnosis not present

## 2021-12-11 ENCOUNTER — Telehealth: Payer: Self-pay

## 2021-12-11 DIAGNOSIS — N3091 Cystitis, unspecified with hematuria: Secondary | ICD-10-CM | POA: Diagnosis not present

## 2021-12-11 NOTE — Telephone Encounter (Signed)
Patient called this afternoon stating that she has a UTI and is requesting an appointment with Gay Filler. Patient notified that unfortunately we do not have an opening with sally or one of the other providers for today. Patient stated she is going to go to UC to be see due to her symptoms.

## 2021-12-22 DIAGNOSIS — Z79899 Other long term (current) drug therapy: Secondary | ICD-10-CM | POA: Diagnosis not present

## 2021-12-22 DIAGNOSIS — E039 Hypothyroidism, unspecified: Secondary | ICD-10-CM | POA: Diagnosis not present

## 2021-12-22 DIAGNOSIS — Z7982 Long term (current) use of aspirin: Secondary | ICD-10-CM | POA: Diagnosis not present

## 2021-12-22 DIAGNOSIS — Z800 Family history of malignant neoplasm of digestive organs: Secondary | ICD-10-CM | POA: Diagnosis not present

## 2021-12-22 DIAGNOSIS — Z7984 Long term (current) use of oral hypoglycemic drugs: Secondary | ICD-10-CM | POA: Diagnosis not present

## 2021-12-22 DIAGNOSIS — D126 Benign neoplasm of colon, unspecified: Secondary | ICD-10-CM | POA: Diagnosis not present

## 2021-12-22 DIAGNOSIS — Z1211 Encounter for screening for malignant neoplasm of colon: Secondary | ICD-10-CM | POA: Diagnosis not present

## 2021-12-22 DIAGNOSIS — E119 Type 2 diabetes mellitus without complications: Secondary | ICD-10-CM | POA: Diagnosis not present

## 2021-12-22 DIAGNOSIS — K648 Other hemorrhoids: Secondary | ICD-10-CM | POA: Diagnosis not present

## 2021-12-22 DIAGNOSIS — I10 Essential (primary) hypertension: Secondary | ICD-10-CM | POA: Diagnosis not present

## 2021-12-22 DIAGNOSIS — K635 Polyp of colon: Secondary | ICD-10-CM | POA: Diagnosis not present

## 2021-12-22 DIAGNOSIS — Z85828 Personal history of other malignant neoplasm of skin: Secondary | ICD-10-CM | POA: Diagnosis not present

## 2021-12-22 DIAGNOSIS — F17210 Nicotine dependence, cigarettes, uncomplicated: Secondary | ICD-10-CM | POA: Diagnosis not present

## 2022-01-06 ENCOUNTER — Other Ambulatory Visit: Payer: Self-pay | Admitting: Legal Medicine

## 2022-01-07 ENCOUNTER — Other Ambulatory Visit: Payer: Self-pay | Admitting: Physician Assistant

## 2022-01-07 DIAGNOSIS — F330 Major depressive disorder, recurrent, mild: Secondary | ICD-10-CM

## 2022-01-10 ENCOUNTER — Encounter: Payer: Self-pay | Admitting: Physician Assistant

## 2022-01-10 ENCOUNTER — Ambulatory Visit (INDEPENDENT_AMBULATORY_CARE_PROVIDER_SITE_OTHER): Payer: PPO | Admitting: Physician Assistant

## 2022-01-10 VITALS — BP 130/72 | HR 63 | Temp 97.2°F | Ht 65.0 in | Wt 155.2 lb

## 2022-01-10 DIAGNOSIS — R1032 Left lower quadrant pain: Secondary | ICD-10-CM

## 2022-01-10 DIAGNOSIS — K5792 Diverticulitis of intestine, part unspecified, without perforation or abscess without bleeding: Secondary | ICD-10-CM | POA: Insufficient documentation

## 2022-01-10 HISTORY — DX: Left lower quadrant pain: R10.32

## 2022-01-10 LAB — POCT URINALYSIS DIP (CLINITEK)
Bilirubin, UA: NEGATIVE
Blood, UA: NEGATIVE
Glucose, UA: NEGATIVE mg/dL
Ketones, POC UA: NEGATIVE mg/dL
Leukocytes, UA: NEGATIVE
Nitrite, UA: NEGATIVE
POC PROTEIN,UA: NEGATIVE
Spec Grav, UA: <=1.005 — AB (ref 1.010–1.025)
Urobilinogen, UA: 0.2 E.U./dL
pH, UA: 7 (ref 5.0–8.0)

## 2022-01-10 MED ORDER — AMOXICILLIN-POT CLAVULANATE 875-125 MG PO TABS: 1.0000 | ORAL_TABLET | Freq: Two times a day (BID) | ORAL | 0 refills | Status: DC

## 2022-01-10 NOTE — Progress Notes (Signed)
Acute Office Visit  Subjective:    Patient ID: Cassandra Fox, female    DOB: 04-01-1955, 66 y.o.   MRN: [id number]  Chief Complaint  Patient presents with   Abdominal Pain    HPI: Patient is in today for complaints of lower abdominal cramping.  She denies vaginal symptoms.  Denies dysuria or urgency.  Did have loose bm this morning.  Denies nausea or vomiting.  Appetite normal - upon further questioning she has been eating more oatmeal and granola.  No history of diverticulitis in past.  Is up to date with colonoscopy Denies fever or malaise  Past Medical History:  Diagnosis Date   Anxiety    Breast mass, right    Depression    Diabetes mellitus without complication ([place])    Hyperlipidemia    Hypertension    Hypothyroidism     Past Surgical History:  Procedure Laterality Date   BREAST EXCISIONAL BIOPSY Right    benign   BREAST LUMPECTOMY WITH RADIOACTIVE SEED LOCALIZATION Right 09/23/2015   Procedure: RIGHT BREAST LUMPECTOMY WITH RADIOACTIVE SEED LOCALIZATION;  Surgeon: Excell Seltzer, MD;  Location: [facility];  Service: General;  Laterality: Right;    Family History  Problem Relation Age of Onset   Hyperlipidemia Mother    Diabetes Mother    Hyperlipidemia Father    Hypertension Father     Social History   Socioeconomic History   Marital status: Married    Spouse name: Not on file   Number of children: 3   Years of education: Not on file   Highest education level: Not on file  Occupational History   Not on file  Tobacco Use   Smoking status: Every Day    Packs/day: 0.50    Types: Cigarettes   Smokeless tobacco: Never  Vaping Use   Vaping Use: Never used  Substance and Sexual Activity   Alcohol use: Not Currently    Comment: social   Drug use: No   Sexual activity: Not Currently  Other Topics Concern   Not on file  Social History Narrative   Not on file   Social Determinants of Health   Financial Resource Strain: Not on  file  Food Insecurity: Not on file  Transportation Needs: Not on file  Physical Activity: Not on file  Stress: Not on file  Social Connections: Not on file  Intimate Partner Violence: Not on file    Outpatient Medications Prior to Visit  Medication Sig Dispense Refill   ALPRAZolam (XANAX) 0.5 MG tablet TAKE 1 TABLET(0.5 MG) BY MOUTH AT BEDTIME AS NEEDED FOR ANXIETY 30 tablet 1   aspirin EC 81 MG tablet Take 81 mg by mouth daily.     citalopram (CELEXA) 40 MG tablet TAKE 1 TABLET BY MOUTH EVERY DAY 30 tablet 3   clobetasol cream (TEMOVATE) 2.63 % Apply 1 application topically 2 (two) times daily. 30 g 0   fluticasone (FLONASE) 50 MCG/ACT nasal spray SHAKE LIQUID AND USE 2 SPRAYS IN EACH NOSTRIL DAILY 16 g 6   glucose blood (ONETOUCH VERIO) test strip USE TO CHECK BLOOD GLUCOSE TWICE DAILY 100 strip 4   levothyroxine (SYNTHROID) 75 MCG tablet TAKE 1 TABLET(75 MCG) BY MOUTH EVERY DAY 90 tablet 1   losartan-hydrochlorothiazide (HYZAAR) 100-25 MG tablet TAKE 1 TABLET BY MOUTH DAILY 90 tablet 2   metFORMIN (GLUCOPHAGE) 500 MG tablet TAKE 1 TABLET BY MOUTH TWICE DAILY 180 tablet 2   metoprolol tartrate (LOPRESSOR) 25 MG tablet TAKE  1 TABLET(25 MG) BY MOUTH DAILY 90 tablet 1   ondansetron (ZOFRAN) 4 MG tablet Take 1 tablet (4 mg total) by mouth every 8 (eight) hours as needed for nausea or vomiting. 20 tablet 0   simvastatin (ZOCOR) 20 MG tablet Take 1 tablet (20 mg total) by mouth at bedtime. 90 tablet 1   Vitamin D, Cholecalciferol, 1000 units CAPS Take by mouth.     Zinc Sulfate (ZINC 15 PO) Take by mouth.     No facility-administered medications prior to visit.    Allergies  Allergen Reactions   Codeine Nausea Only    Review of Systems CONSTITUTIONAL: Negative for chills, fatigue, fever,  CARDIOVASCULAR: Negative for chest pain,  RESPIRATORY: Negative for recent cough and dyspnea.  GASTROINTESTINAL: see HPI GU - see HPI INTEGUMENTARY: Negative for rash.       Objective:   PHYSICAL EXAM:   VS: BP 130/72 (BP Location: Left Arm, Patient Position: Sitting, Cuff Size: Large)   Pulse 63   Temp (!) 97.2 F (36.2 C) (Temporal)   Ht _0  (1.651 m)   Wt 155 lb 3.2 oz (70.4 kg)   SpO2 98%   BMI 25.83 kg/m   GEN: Well nourished, well developed, in no acute distress  Cardiac: RRR; no murmurs,  Respiratory:  normal respiratory rate and pattern with no distress - normal breath sounds with no rales, rhonchi, wheezes or rubs GI: normal bowel sounds, has mild LLQ tenderness - no guarding or rebound  Office Visit on 01/10/2022  Component Date Value Ref Range Status   Color, UA 01/10/2022 yellow  yellow Final   Clarity, UA 01/10/2022 clear  clear Final   Glucose, UA 01/10/2022 negative  negative mg/dL Final   Bilirubin, UA 01/10/2022 negative  negative Final   Ketones, POC UA 01/10/2022 negative  negative mg/dL Final   Spec Grav, UA 01/10/2022 <=1.005 (A)  1.010 - 1.025 Final   Blood, UA 01/10/2022 negative  negative Final   pH, UA 01/10/2022 7.0  5.0 - 8.0 Final   POC PROTEIN,UA 01/10/2022 negative  negative, trace Final   Urobilinogen, UA 01/10/2022 0.2  0.2 or 1.0 E.U./dL Final   Nitrite, UA 01/10/2022 Negative  Negative Final   Leukocytes, UA 01/10/2022 Negative  Negative Final     Health Maintenance Due  Topic Date Due   Medicare Annual Wellness (AWV)  Never done   COVID-19 Vaccine (5 - 2023-24 season) 09/22/2021    There are no preventive care reminders to display for this patient.   Lab Results  Component Value Date   TSH 1.770 10/11/2021   Lab Results  Component Value Date   WBC 11.2 (H) 10/11/2021   HGB 12.7 10/11/2021   HCT 37.1 10/11/2021   MCV 88 10/11/2021   PLT 149 (L) 10/11/2021   Lab Results  Component Value Date   NA 132 (L) 10/11/2021   K 4.0 10/11/2021   CO2 23 10/11/2021   GLUCOSE 92 10/11/2021   BUN 9 10/11/2021   CREATININE 0.80 10/11/2021   BILITOT 0.4 10/11/2021   ALKPHOS 81 10/11/2021   AST 20 10/11/2021   ALT  16 10/11/2021   PROT 6.9 10/11/2021   ALBUMIN 4.1 10/11/2021   CALCIUM 9.4 10/11/2021   ANIONGAP 9 09/22/2015   EGFR 80 10/11/2021   Lab Results  Component Value Date   CHOL 146 10/11/2021   Lab Results  Component Value Date   HDL 43 10/11/2021   Lab Results  Component Value Date  [place] 69 10/11/2021   Lab Results  Component Value Date   TRIG 209 (H) 10/11/2021   Lab Results  Component Value Date   CHOLHDL 3.4 10/11/2021   Lab Results  Component Value Date   HGBA1C 6.3 (H) 10/11/2021       Assessment & Plan:   Problem List Items Addressed This Visit       Other   LLQ pain - Primary   Relevant Orders   POCT URINALYSIS DIP (CLINITEK) (Completed)   Diverticulitis   Relevant Medications   amoxicillin-clavulanate (AUGMENTIN) 875-125 MG tablet Recommend soft diet   Meds ordered this encounter  Medications   amoxicillin-clavulanate (AUGMENTIN) 875-125 MG tablet    Sig: Take 1 tablet by mouth 2 (two) times daily.    Dispense:  20 tablet    Refill:  0    Order Specific Question:   Supervising Provider    AnswerShelton Silvas    Orders Placed This Encounter  Procedures   POCT URINALYSIS DIP (CLINITEK)     Follow-up: Return if symptoms worsen or fail to improve.  An After Visit Summary was printed and given to the patient.  Yetta Flock [facility] [phone]

## 2022-01-26 ENCOUNTER — Other Ambulatory Visit: Payer: Self-pay | Admitting: Legal Medicine

## 2022-01-26 DIAGNOSIS — F330 Major depressive disorder, recurrent, mild: Secondary | ICD-10-CM

## 2022-01-30 ENCOUNTER — Telehealth: Payer: Self-pay

## 2022-01-30 NOTE — Telephone Encounter (Signed)
Called patient left detailed message, for patient to call office back

## 2022-01-30 NOTE — Telephone Encounter (Signed)
Patient called and stated that she is having burning sensation in her right foot. Denies pain or swelling. Wanted to know if there is something she can take for it.she has a fasting follow up 02/14/22. Please advise

## 2022-01-31 ENCOUNTER — Ambulatory Visit (INDEPENDENT_AMBULATORY_CARE_PROVIDER_SITE_OTHER): Payer: PPO | Admitting: Physician Assistant

## 2022-01-31 ENCOUNTER — Encounter: Payer: Self-pay | Admitting: Physician Assistant

## 2022-01-31 VITALS — BP 122/70 | HR 68 | Temp 96.5°F | Ht 65.0 in | Wt 158.6 lb

## 2022-01-31 DIAGNOSIS — G629 Polyneuropathy, unspecified: Secondary | ICD-10-CM | POA: Diagnosis not present

## 2022-01-31 DIAGNOSIS — R0989 Other specified symptoms and signs involving the circulatory and respiratory systems: Secondary | ICD-10-CM

## 2022-01-31 MED ORDER — GABAPENTIN 300 MG PO CAPS: 300.0000 mg | ORAL_CAPSULE | Freq: Every day | ORAL | 1 refills | Status: DC

## 2022-01-31 NOTE — Progress Notes (Signed)
Acute Office Visit  Subjective:    Patient ID: Cassandra Fox, female    DOB: [birth year], 67 y.o.   MRN: [id number]  Chief Complaint  Patient presents with   Foot Burn    HPI: Patient is in today for complaints of bilateral feet burning.  She  states this has been going on for about a year but worse in the past several weeks.  She states she feels it mostly on the top of her right foot and anterior shin.  Occasionally has issues with left foot as well She has noted also that both her feet feel cold all the time Is not having a lot of pain - mostly just burning   Past Medical History:  Diagnosis Date   Anxiety    Breast mass, right    Depression    Diabetes mellitus without complication ([place])    Hyperlipidemia    Hypertension    Hypothyroidism     Past Surgical History:  Procedure Laterality Date   BREAST EXCISIONAL BIOPSY Right    benign   BREAST LUMPECTOMY WITH RADIOACTIVE SEED LOCALIZATION Right 09/23/2015   Procedure: RIGHT BREAST LUMPECTOMY WITH RADIOACTIVE SEED LOCALIZATION;  Surgeon: Excell Seltzer, MD;  Location: [facility];  Service: General;  Laterality: Right;    Family History  Problem Relation Age of Onset   Hyperlipidemia Mother    Diabetes Mother    Hyperlipidemia Father    Hypertension Father     Social History   Socioeconomic History   Marital status: Married    Spouse name: Not on file   Number of children: 3   Years of education: Not on file   Highest education level: Not on file  Occupational History   Not on file  Tobacco Use   Smoking status: Every Day    Packs/day: 0.50    Types: Cigarettes   Smokeless tobacco: Never  Vaping Use   Vaping Use: Never used  Substance and Sexual Activity   Alcohol use: Not Currently    Comment: social   Drug use: No   Sexual activity: Not Currently  Other Topics Concern   Not on file  Social History Narrative   Not on file   Social Determinants of Health   Financial  Resource Strain: Not on file  Food Insecurity: Not on file  Transportation Needs: Not on file  Physical Activity: Not on file  Stress: Not on file  Social Connections: Not on file  Intimate Partner Violence: Not on file    Outpatient Medications Prior to Visit  Medication Sig Dispense Refill   ALPRAZolam (XANAX) 0.5 MG tablet TAKE 1 TABLET(0.5 MG) BY MOUTH AT BEDTIME AS NEEDED FOR ANXIETY 30 tablet 1   aspirin EC 81 MG tablet Take 81 mg by mouth daily.     citalopram (CELEXA) 40 MG tablet TAKE 1 TABLET BY MOUTH EVERY DAY 30 tablet 3   clobetasol cream (TEMOVATE) 2.70 % Apply 1 application topically 2 (two) times daily. 30 g 0   fluticasone (FLONASE) 50 MCG/ACT nasal spray SHAKE LIQUID AND USE 2 SPRAYS IN EACH NOSTRIL DAILY 16 g 6   glucose blood (ONETOUCH VERIO) test strip USE TO CHECK BLOOD GLUCOSE TWICE DAILY 100 strip 4   levothyroxine (SYNTHROID) 75 MCG tablet TAKE 1 TABLET(75 MCG) BY MOUTH EVERY DAY 90 tablet 1   losartan-hydrochlorothiazide (HYZAAR) 100-25 MG tablet TAKE 1 TABLET BY MOUTH DAILY 90 tablet 2   metFORMIN (GLUCOPHAGE) 500 MG tablet TAKE 1 TABLET  BY MOUTH TWICE DAILY 180 tablet 2   metoprolol tartrate (LOPRESSOR) 25 MG tablet TAKE 1 TABLET(25 MG) BY MOUTH DAILY 90 tablet 1   ondansetron (ZOFRAN) 4 MG tablet Take 1 tablet (4 mg total) by mouth every 8 (eight) hours as needed for nausea or vomiting. 20 tablet 0   simvastatin (ZOCOR) 20 MG tablet Take 1 tablet (20 mg total) by mouth at bedtime. 90 tablet 1   Vitamin D, Cholecalciferol, 1000 units CAPS Take by mouth.     Zinc Sulfate (ZINC 15 PO) Take by mouth.     amoxicillin-clavulanate (AUGMENTIN) 875-125 MG tablet Take 1 tablet by mouth 2 (two) times daily. 20 tablet 0   No facility-administered medications prior to visit.    Allergies  Allergen Reactions   Codeine Nausea Only    Review of Systems CONSTITUTIONAL: Negative for chills, fatigue, fever,  E/N/T: Negative for ear pain, nasal congestion and sore  throat.  CARDIOVASCULAR: Negative for chest pain, dizziness, RESPIRATORY: Negative for recent cough and dyspnea.  MSK: see HPI INTEGUMENTARY: Negative for rash.         Objective:  PHYSICAL EXAM:   VS: BP 122/70 (BP Location: Left Arm, Patient Position: Sitting, Cuff Size: Normal)   Pulse 68   Temp (!) 96.5 F (35.8 C) (Temporal)   Ht '5\' 5"'$  (1.651 m)   Wt 158 lb 9.6 oz (71.9 kg)   SpO2 97%   BMI 26.39 kg/m   GEN: Well nourished, well developed, in no acute distress  Cardiac: RRR; no murmurs, - slightly diminished pulses noted bilaterally Respiratory:  normal respiratory rate and pattern with no distress - normal breath sounds with no rales, rhonchi, wheezes or rubs MS: no deformity or atrophy  Skin: feet - slightly red/mottled      Health Maintenance Due  Topic Date Due   Medicare Annual Wellness (AWV)  Never done   Zoster Vaccines- Shingrix (1 of 2) Never done   COVID-19 Vaccine (5 - 2023-24 season) 09/22/2021    There are no preventive care reminders to display for this patient.   Lab Results  Component Value Date   TSH 1.770 10/11/2021   Lab Results  Component Value Date   WBC 11.2 (H) 10/11/2021   HGB 12.7 10/11/2021   HCT 37.1 10/11/2021   MCV 88 10/11/2021   PLT 149 (L) 10/11/2021   Lab Results  Component Value Date   NA 132 (L) 10/11/2021   K 4.0 10/11/2021   CO2 23 10/11/2021   GLUCOSE 92 10/11/2021   BUN 9 10/11/2021   CREATININE 0.80 10/11/2021   BILITOT 0.4 10/11/2021   ALKPHOS 81 10/11/2021   AST 20 10/11/2021   ALT 16 10/11/2021   PROT 6.9 10/11/2021   ALBUMIN 4.1 10/11/2021   CALCIUM 9.4 10/11/2021   ANIONGAP 9 09/22/2015   EGFR 80 10/11/2021   Lab Results  Component Value Date   CHOL 146 10/11/2021   Lab Results  Component Value Date   HDL 43 10/11/2021   Lab Results  Component Value Date   LDLCALC 69 10/11/2021   Lab Results  Component Value Date   TRIG 209 (H) 10/11/2021   Lab Results  Component Value Date    CHOLHDL 3.4 10/11/2021   Lab Results  Component Value Date   HGBA1C 6.3 (H) 10/11/2021       Assessment & Plan:   Problem List Items Addressed This Visit   None Visit Diagnoses     Neuropathy    -  Primary   Relevant Medications   gabapentin (NEURONTIN) 300 MG capsule   Other Relevant Orders   B12 and Folate Panel   VAS [place] ABI WITH/WO TBI   Diminished pulses in lower extremity       Relevant Orders   VAS [place] ABI WITH/WO TBI      Meds ordered this encounter  Medications   gabapentin (NEURONTIN) 300 MG capsule    Sig: Take 1 capsule (300 mg total) by mouth at bedtime.    Dispense:  30 capsule    Refill:  1    Order Specific Question:   Supervising Provider    AnswerShelton Silvas    Orders Placed This Encounter  Procedures   B12 and Folate Panel   VAS [place] ABI WITH/WO TBI     Follow-up: Return if symptoms worsen or fail to improve. Otherwise pt has follow up in one month for chronic visit  An After Visit Summary was printed and given to the patient.  Yetta Flock [facility] [phone]

## 2022-02-01 LAB — B12 AND FOLATE PANEL
Folate: 9.1 ng/mL (ref >3.0)
Vitamin B-12: 323 pg/mL (ref 232–1245)

## 2022-02-12 ENCOUNTER — Ambulatory Visit: Payer: PPO | Attending: Cardiology

## 2022-02-12 DIAGNOSIS — G629 Polyneuropathy, unspecified: Secondary | ICD-10-CM

## 2022-02-12 DIAGNOSIS — R0989 Other specified symptoms and signs involving the circulatory and respiratory systems: Secondary | ICD-10-CM | POA: Diagnosis not present

## 2022-02-13 LAB — VAS US ABI WITH/WO TBI
Left ABI: 1.06
Right ABI: 1.03

## 2022-02-14 ENCOUNTER — Ambulatory Visit: Payer: PPO | Admitting: Physician Assistant

## 2022-02-14 ENCOUNTER — Other Ambulatory Visit: Payer: Self-pay | Admitting: Physician Assistant

## 2022-02-14 DIAGNOSIS — R6889 Other general symptoms and signs: Secondary | ICD-10-CM

## 2022-02-16 ENCOUNTER — Ambulatory Visit (INDEPENDENT_AMBULATORY_CARE_PROVIDER_SITE_OTHER): Payer: PPO | Admitting: Nurse Practitioner

## 2022-02-16 ENCOUNTER — Other Ambulatory Visit: Payer: Self-pay | Admitting: Nurse Practitioner

## 2022-02-16 ENCOUNTER — Encounter: Payer: Self-pay | Admitting: Nurse Practitioner

## 2022-02-16 VITALS — BP 174/72 | HR 62 | Temp 97.3°F | Ht 65.0 in | Wt 155.0 lb

## 2022-02-16 DIAGNOSIS — I10 Essential (primary) hypertension: Secondary | ICD-10-CM

## 2022-02-16 MED ORDER — AMLODIPINE BESYLATE 10 MG PO TABS: 10.0000 mg | ORAL_TABLET | Freq: Every day | ORAL | 0 refills | Status: DC

## 2022-02-16 NOTE — Assessment & Plan Note (Signed)
Taking metoprolol 25 mg daily, Hyzaar 100-25 mg daily  Home BP 182/92 maximum Clinic BP today 174/72  Added Amlodipine 10 mg daily today Will follow up Feb 8th and do some work out for resistant HYPERTENSION and referral to hypertensive clinic in future if not controlled.  Nutrition: Stressed importance of moderation in sodium intake, saturated fat and cholesterol, caloric balance, sufficient intake of complex carbohydrates, fiber, calcium and iron.   Exercise: Stressed the importance of regular exercise.

## 2022-02-16 NOTE — Patient Instructions (Signed)
Follow up Feb 8th 2024  Hypertension, Adult Hypertension is another name for high blood pressure. High blood pressure forces your heart to work harder to pump blood. This can cause problems over time. There are two numbers in a blood pressure reading. There is a top number (systolic) over a bottom number (diastolic). It is best to have a blood pressure that is below 120/80. What are the causes? The cause of this condition is not known. Some other conditions can lead to high blood pressure. What increases the risk? Some lifestyle factors can make you more likely to develop high blood pressure: Smoking. Not getting enough exercise or physical activity. Being overweight. Having too much fat, sugar, calories, or salt (sodium) in your diet. Drinking too much alcohol. Other risk factors include: Having any of these conditions: Heart disease. Diabetes. High cholesterol. Kidney disease. Obstructive sleep apnea. Having a family history of high blood pressure and high cholesterol. Age. The risk increases with age. Stress. What are the signs or symptoms? High blood pressure may not cause symptoms. Very high blood pressure (hypertensive crisis) may cause: Headache. Fast or uneven heartbeats (palpitations). Shortness of breath. Nosebleed. Vomiting or feeling like you may vomit (nauseous). Changes in how you see. Very bad chest pain. Feeling dizzy. Seizures. How is this treated? This condition is treated by making healthy lifestyle changes, such as: Eating healthy foods. Exercising more. Drinking less alcohol. Your doctor may prescribe medicine if lifestyle changes do not help enough and if: Your top number is above 130. Your bottom number is above 80. Your personal target blood pressure may vary. Follow these instructions at home: Eating and drinking  If told, follow the DASH eating plan. To follow this plan: Fill one half of your plate at each meal with fruits and  vegetables. Fill one fourth of your plate at each meal with whole grains. Whole grains include whole-wheat pasta, brown rice, and whole-grain bread. Eat or drink low-fat dairy products, such as skim milk or low-fat yogurt. Fill one fourth of your plate at each meal with low-fat (lean) proteins. Low-fat proteins include fish, chicken without skin, eggs, beans, and tofu. Avoid fatty meat, cured and processed meat, or chicken with skin. Avoid pre-made or processed food. Limit the amount of salt in your diet to less than 1,500 mg each day. Do not drink alcohol if: Your doctor tells you not to drink. You are pregnant, may be pregnant, or are planning to become pregnant. If you drink alcohol: Limit how much you have to: 0-1 drink a day for women. 0-2 drinks a day for men. Know how much alcohol is in your drink. In the [place] one drink equals one 12 oz bottle of beer (355 mL), one 5 oz glass of wine (148 mL), or one 1 oz glass of hard liquor (44 mL). Lifestyle  Work with your doctor to stay at a healthy weight or to lose weight. Ask your doctor what the best weight is for you. Get at least 30 minutes of exercise that causes your heart to beat faster (aerobic exercise) most days of the week. This may include walking, swimming, or biking. Get at least 30 minutes of exercise that strengthens your muscles (resistance exercise) at least 3 days a week. This may include lifting weights or doing Pilates. Do not smoke or use any products that contain nicotine or tobacco. If you need help quitting, ask your doctor. Check your blood pressure at home as told by your doctor. Keep all follow-up visits.  Medicines Take over-the-counter and prescription medicines only as told by your doctor. Follow directions carefully. Do not skip doses of blood pressure medicine. The medicine does not work as well if you skip doses. Skipping doses also puts you at risk for problems. Ask your doctor about side effects or  reactions to medicines that you should watch for. Contact a doctor if: You think you are having a reaction to the medicine you are taking. You have headaches that keep coming back. You feel dizzy. You have swelling in your ankles. You have trouble with your vision. Get help right away if: You get a very bad headache. You start to feel mixed up (confused). You feel weak or numb. You feel faint. You have very bad pain in your: Chest. Belly (abdomen). You vomit more than once. You have trouble breathing. These symptoms may be an emergency. Get help right away. Call 911. Do not wait to see if the symptoms will go away. Do not drive yourself to the hospital. Summary Hypertension is another name for high blood pressure. High blood pressure forces your heart to work harder to pump blood. For most people, a normal blood pressure is less than 120/80. Making healthy choices can help lower blood pressure. If your blood pressure does not get lower with healthy choices, you may need to take medicine. This information is not intended to replace advice given to you by your health care provider. Make sure you discuss any questions you have with your health care provider. Document Revised: 10/27/2020 Document Reviewed: 10/27/2020 Elsevier Patient Education  Cassandra Fox.

## 2022-02-16 NOTE — Progress Notes (Signed)
Acute Office Visit  Subjective:    Patient ID: Cassandra Fox, female    DOB: 1955-02-14, 67 y.o.   MRN: [id number]  Chief Complaints: high BP  History of Present ilIness: Patient is in today for for uncontrolled BP, she was taking her BP at home and it was running like  187/100, 182/96,  and  172/92 since past 2-3 days.She is taking metoprolol 25 mg daily , hyzaar 100-25 mg daily . She is not doing or eating anything different. She is just concerned about her high BP. Denies SHORTNESS OF BREATH, Palpitation, distress and chest pain. Her Next appointment with [place] in Feb 8th.  Past Medical History:  Diagnosis Date   Anxiety    Breast mass, right    Depression    Diabetes mellitus without complication ([place])    Hyperlipidemia    Hypertension    Hypothyroidism     Past Surgical History:  Procedure Laterality Date   BREAST EXCISIONAL BIOPSY Right    benign   BREAST LUMPECTOMY WITH RADIOACTIVE SEED LOCALIZATION Right 09/23/2015   Procedure: RIGHT BREAST LUMPECTOMY WITH RADIOACTIVE SEED LOCALIZATION;  Surgeon: Excell Seltzer, MD;  Location: [facility];  Service: General;  Laterality: Right;    Family History  Problem Relation Age of Onset   Hyperlipidemia Mother    Diabetes Mother    Hyperlipidemia Father    Hypertension Father     Social History   Socioeconomic History   Marital status: Married    Spouse name: Not on file   Number of children: 3   Years of education: Not on file   Highest education level: Not on file  Occupational History   Not on file  Tobacco Use   Smoking status: Every Day    Packs/day: 0.50    Types: Cigarettes   Smokeless tobacco: Never  Vaping Use   Vaping Use: Never used  Substance and Sexual Activity   Alcohol use: Not Currently    Comment: social   Drug use: No   Sexual activity: Not Currently  Other Topics Concern   Not on file  Social History Narrative   Not on file   Social Determinants of Health   Financial  Resource Strain: Not on file  Food Insecurity: Not on file  Transportation Needs: Not on file  Physical Activity: Not on file  Stress: Not on file  Social Connections: Not on file  Intimate Partner Violence: Not on file    Outpatient Medications Prior to Visit  Medication Sig Dispense Refill   ALPRAZolam (XANAX) 0.5 MG tablet TAKE 1 TABLET(0.5 MG) BY MOUTH AT BEDTIME AS NEEDED FOR ANXIETY 30 tablet 1   aspirin EC 81 MG tablet Take 81 mg by mouth daily.     citalopram (CELEXA) 40 MG tablet TAKE 1 TABLET BY MOUTH EVERY DAY 30 tablet 3   clobetasol cream (TEMOVATE) 5.63 % Apply 1 application topically 2 (two) times daily. 30 g 0   fluticasone (FLONASE) 50 MCG/ACT nasal spray SHAKE LIQUID AND USE 2 SPRAYS IN EACH NOSTRIL DAILY 16 g 6   gabapentin (NEURONTIN) 300 MG capsule Take 1 capsule (300 mg total) by mouth at bedtime. 30 capsule 1   glucose blood (ONETOUCH VERIO) test strip USE TO CHECK BLOOD GLUCOSE TWICE DAILY 100 strip 4   levothyroxine (SYNTHROID) 75 MCG tablet TAKE 1 TABLET(75 MCG) BY MOUTH EVERY DAY 90 tablet 1   losartan-hydrochlorothiazide (HYZAAR) 100-25 MG tablet TAKE 1 TABLET BY MOUTH DAILY 90 tablet 2  metFORMIN (GLUCOPHAGE) 500 MG tablet TAKE 1 TABLET BY MOUTH TWICE DAILY 180 tablet 2   metoprolol tartrate (LOPRESSOR) 25 MG tablet TAKE 1 TABLET(25 MG) BY MOUTH DAILY 90 tablet 1   simvastatin (ZOCOR) 20 MG tablet Take 1 tablet (20 mg total) by mouth at bedtime. 90 tablet 1   Vitamin D, Cholecalciferol, 1000 units CAPS Take by mouth.     Zinc Sulfate (ZINC 15 PO) Take by mouth.     ondansetron (ZOFRAN) 4 MG tablet Take 1 tablet (4 mg total) by mouth every 8 (eight) hours as needed for nausea or vomiting. 20 tablet 0   No facility-administered medications prior to visit.    Allergies  Allergen Reactions   Codeine Nausea Only    Review of Systems  Constitutional:  Negative for chills, diaphoresis, fatigue and fever.  HENT:  Negative for congestion, ear pain and sore  throat.   Respiratory:  Negative for cough and shortness of breath.   Cardiovascular:  Negative for chest pain and palpitations.  Gastrointestinal:  Negative for abdominal pain, constipation, diarrhea, nausea and vomiting.  Endocrine: Negative for polydipsia, polyphagia and polyuria.  Genitourinary:  Negative for dysuria and frequency.  Musculoskeletal:  Negative for arthralgias, back pain and myalgias.  Neurological:  Negative for dizziness and headaches.  Psychiatric/Behavioral:  Negative for dysphoric mood. The patient is not nervous/anxious.        Objective:    Physical Exam Vitals reviewed.  Constitutional:      Appearance: Normal appearance.  Neck:     Vascular: No carotid bruit.  Cardiovascular:     Rate and Rhythm: Normal rate and regular rhythm.     Heart sounds: Normal heart sounds.  Pulmonary:     Effort: Pulmonary effort is normal.     Breath sounds: Normal breath sounds.  Abdominal:     General: Bowel sounds are normal.     Palpations: Abdomen is soft.     Tenderness: There is no abdominal tenderness.  Neurological:     Mental Status: She is alert and oriented to person, place, and time.  Psychiatric:        Mood and Affect: Mood normal.        Behavior: Behavior normal.     BP (!) 174/72 (BP Location: Left Arm, Patient Position: Sitting)   Pulse 62   Temp (!) 97.3 F (36.3 C) (Temporal)   Ht '5\' 5"'$  (1.651 m)   Wt 155 lb (70.3 kg)   SpO2 98%   BMI 25.79 kg/m  Wt Readings from Last 3 Encounters:  02/16/22 155 lb (70.3 kg)  01/31/22 158 lb 9.6 oz (71.9 kg)  01/10/22 155 lb 3.2 oz (70.4 kg)    Health Maintenance Due  Topic Date Due   Medicare Annual Wellness (AWV)  Never done   Zoster Vaccines- Shingrix (1 of 2) Never done   COVID-19 Vaccine (5 - 2023-24 season) 09/22/2021    There are no preventive care reminders to display for this patient.   Lab Results  Component Value Date   TSH 1.770 10/11/2021   Lab Results  Component Value Date    WBC 11.2 (H) 10/11/2021   HGB 12.7 10/11/2021   HCT 37.1 10/11/2021   MCV 88 10/11/2021   PLT 149 (L) 10/11/2021   Lab Results  Component Value Date   NA 132 (L) 10/11/2021   K 4.0 10/11/2021   CO2 23 10/11/2021   GLUCOSE 92 10/11/2021   BUN 9 10/11/2021   CREATININE 0.80  10/11/2021   BILITOT 0.4 10/11/2021   ALKPHOS 81 10/11/2021   AST 20 10/11/2021   ALT 16 10/11/2021   PROT 6.9 10/11/2021   ALBUMIN 4.1 10/11/2021   CALCIUM 9.4 10/11/2021   ANIONGAP 9 09/22/2015   EGFR 80 10/11/2021   Lab Results  Component Value Date   CHOL 146 10/11/2021   Lab Results  Component Value Date   HDL 43 10/11/2021   Lab Results  Component Value Date   LDLCALC 69 10/11/2021   Lab Results  Component Value Date   TRIG 209 (H) 10/11/2021   Lab Results  Component Value Date   CHOLHDL 3.4 10/11/2021   Lab Results  Component Value Date   HGBA1C 6.3 (H) 10/11/2021       Assessment & Plan:   Benign hypertension Assessment & Plan: Taking metoprolol 25 mg daily, Hyzaar 100-25 mg daily  Home BP 182/92 maximum Clinic BP today 174/72  Added Amlodipine 10 mg daily today Will follow up Feb 8th and do some work out for resistant HYPERTENSION and referral to hypertensive clinic in future if not controlled.  Nutrition: Stressed importance of moderation in sodium intake, saturated fat and cholesterol, caloric balance, sufficient intake of complex carbohydrates, fiber, calcium and iron.   Exercise: Stressed the importance of regular exercise.     Orders: -     amLODIPine Besylate; Take 1 tablet (10 mg total) by mouth daily.  Dispense: 90 tablet; Refill: 0    Follow-up: Return in about 13 days (around 03/01/2022) for CHRONIC, FASTING. And follow up for BP  An After Visit Summary was printed and given to the patient.  Neil Crouch, DNP, [place] [phone]

## 2022-03-01 ENCOUNTER — Other Ambulatory Visit: Payer: Self-pay | Admitting: Physician Assistant

## 2022-03-01 ENCOUNTER — Encounter: Payer: Self-pay | Admitting: Physician Assistant

## 2022-03-01 ENCOUNTER — Ambulatory Visit (INDEPENDENT_AMBULATORY_CARE_PROVIDER_SITE_OTHER): Payer: PPO | Admitting: Physician Assistant

## 2022-03-01 VITALS — BP 118/68 | HR 52 | Temp 97.0°F | Ht 65.0 in | Wt 157.0 lb

## 2022-03-01 DIAGNOSIS — E559 Vitamin D deficiency, unspecified: Secondary | ICD-10-CM

## 2022-03-01 DIAGNOSIS — E039 Hypothyroidism, unspecified: Secondary | ICD-10-CM | POA: Diagnosis not present

## 2022-03-01 DIAGNOSIS — F330 Major depressive disorder, recurrent, mild: Secondary | ICD-10-CM

## 2022-03-01 DIAGNOSIS — E785 Hyperlipidemia, unspecified: Secondary | ICD-10-CM

## 2022-03-01 DIAGNOSIS — I10 Essential (primary) hypertension: Secondary | ICD-10-CM

## 2022-03-01 DIAGNOSIS — E1169 Type 2 diabetes mellitus with other specified complication: Secondary | ICD-10-CM | POA: Diagnosis not present

## 2022-03-01 DIAGNOSIS — G629 Polyneuropathy, unspecified: Secondary | ICD-10-CM

## 2022-03-01 HISTORY — DX: Vitamin D deficiency, unspecified: E55.9

## 2022-03-01 NOTE — Progress Notes (Signed)
Subjective:  Patient ID: Cassandra Fox, female    DOB: 10/06/1955  Age: 67 y.o. MRN: [id number]  Chief Complaint  Patient presents with   Hyperlipidemia   Hypertension   Diabetes    Hyperlipidemia  Hypertension  Diabetes    Pt presents for follow up of hypertension. The patient is tolerating the medication well without side effects. Compliance with treatment has been good; including taking medication as directed , maintains a healthy diet and regular exercise regimen , and following up as directed. Pt currently taking amlodpine '10mg'$ , hyzaar 100/25 and metoprolol '25mg'$    Pt with history of hypothyroidism - currently on synthroid 88mg  Pt with history of NIDDM - currently on glucophage '500mg'$  bid  -sugars have been in good control She is up to date with eye exam - voices no problems or concerns  Pt with history of depression with anxiety - currently taking celexa '40mg'$  qd and xanax as needed -   Mixed hyperlipidemia  Pt presents with hyperlipidemia.  Compliance with treatment has been good The patient is compliant with medications, maintains a low cholesterol diet , follows up as directed , and maintains an exercise regimen . The patient denies experiencing any hypercholesterolemia related symptoms. Currently on zocor '20mg'$  qd  Pt takes otc vit D supplements - due for labwork  Pt states that neurontin that she recently started for paresthesias has helped greatly Current Outpatient Medications on File Prior to Visit  Medication Sig Dispense Refill   ALPRAZolam (XANAX) 0.5 MG tablet TAKE 1 TABLET(0.5 MG) BY MOUTH AT BEDTIME AS NEEDED FOR ANXIETY 30 tablet 1   amLODipine (NORVASC) 10 MG tablet Take 1 tablet (10 mg total) by mouth daily. 90 tablet 0   aspirin EC 81 MG tablet Take 81 mg by mouth daily.     citalopram (CELEXA) 40 MG tablet TAKE 1 TABLET BY MOUTH EVERY DAY 30 tablet 3   clobetasol cream (TEMOVATE) 05.78% Apply 1 application topically 2 (two) times daily. 30 g 0    fluticasone (FLONASE) 50 MCG/ACT nasal spray SHAKE LIQUID AND USE 2 SPRAYS IN EACH NOSTRIL DAILY 16 g 6   glucose blood (ONETOUCH VERIO) test strip USE TO CHECK BLOOD GLUCOSE TWICE DAILY 100 strip 4   levothyroxine (SYNTHROID) 75 MCG tablet TAKE 1 TABLET(75 MCG) BY MOUTH EVERY DAY 90 tablet 1   losartan-hydrochlorothiazide (HYZAAR) 100-25 MG tablet TAKE 1 TABLET BY MOUTH DAILY 90 tablet 2   metFORMIN (GLUCOPHAGE) 500 MG tablet TAKE 1 TABLET BY MOUTH TWICE DAILY 180 tablet 2   metoprolol tartrate (LOPRESSOR) 25 MG tablet TAKE 1 TABLET(25 MG) BY MOUTH DAILY 90 tablet 1   Multiple Vitamins-Minerals (OCUVITE PRESERVISION PO) Take by mouth.     simvastatin (ZOCOR) 20 MG tablet Take 1 tablet (20 mg total) by mouth at bedtime. 90 tablet 1   Vitamin D, Cholecalciferol, 1000 units CAPS Take by mouth.     Zinc Sulfate (ZINC 15 PO) Take by mouth.     No current facility-administered medications on file prior to visit.   Past Medical History:  Diagnosis Date   Anxiety    Breast mass, right    Depression    Diabetes mellitus without complication ([place]    Hyperlipidemia    Hypertension    Hypothyroidism    Past Surgical History:  Procedure Laterality Date   BREAST EXCISIONAL BIOPSY Right    benign   BREAST LUMPECTOMY WITH RADIOACTIVE SEED LOCALIZATION Right 09/23/2015   Procedure: RIGHT BREAST LUMPECTOMY WITH  RADIOACTIVE SEED LOCALIZATION;  Surgeon: Excell Seltzer, MD;  Location: [facility];  Service: General;  Laterality: Right;    Family History  Problem Relation Age of Onset   Hyperlipidemia Mother    Diabetes Mother    Hyperlipidemia Father    Hypertension Father    Social History   Socioeconomic History   Marital status: Married    Spouse name: Not on file   Number of children: 3   Years of education: Not on file   Highest education level: Not on file  Occupational History   Not on file  Tobacco Use   Smoking status: Every Day    Packs/day: 0.50    Types:  Cigarettes   Smokeless tobacco: Never  Vaping Use   Vaping Use: Never used  Substance and Sexual Activity   Alcohol use: Not Currently    Comment: social   Drug use: No   Sexual activity: Not Currently  Other Topics Concern   Not on file  Social History Narrative   Not on file   Social Determinants of Health   Financial Resource Strain: Not on file  Food Insecurity: Not on file  Transportation Needs: Not on file  Physical Activity: Not on file  Stress: Not on file  Social Connections: Not on file    Review of Systems CONSTITUTIONAL: Negative for chills, fatigue, fever, unintentional weight gain and unintentional weight loss.  E/N/T: Negative for ear pain, nasal congestion and sore throat.  CARDIOVASCULAR: Negative for chest pain, dizziness, palpitations and pedal edema.  RESPIRATORY: Negative for recent cough and dyspnea.  GASTROINTESTINAL: Negative for abdominal pain, acid reflux symptoms, constipation, diarrhea, nausea and vomiting.  MSK: Negative for arthralgias and myalgias.  INTEGUMENTARY: Negative for rash.  NEUROLOGICAL: Negative for dizziness and headaches.  PSYCHIATRIC: Negative for sleep disturbance and to question depression screen.  Negative for depression, negative for anhedonia.          Objective:  PHYSICAL EXAM:   VS: BP 118/68 (BP Location: Left Arm, Patient Position: Sitting)   Pulse (!) 52   Temp (!) 97 F (36.1 C) (Temporal)   Ht '5\' 5"'$  (1.651 m)   Wt 157 lb (71.2 kg)   SpO2 97%   BMI 26.13 kg/m   GEN: Well nourished, well developed, in no acute distress  Cardiac: RRR; no murmurs, rubs, or gallops,no edema -  Respiratory:  normal respiratory rate and pattern with no distress - normal breath sounds with no rales, rhonchi, wheezes or rubs MS: no deformity or atrophy  Skin: warm and dry, no rash  Psych: euthymic mood, appropriate affect and demeanor   Lab Results  Component Value Date   WBC 11.2 (H) 10/11/2021   HGB 12.7 10/11/2021   HCT  37.1 10/11/2021   PLT 149 (L) 10/11/2021   GLUCOSE 92 10/11/2021   CHOL 146 10/11/2021   TRIG 209 (H) 10/11/2021   HDL 43 10/11/2021   LDLCALC 69 10/11/2021   ALT 16 10/11/2021   AST 20 10/11/2021   NA 132 (L) 10/11/2021   K 4.0 10/11/2021   CL 93 (L) 10/11/2021   CREATININE 0.80 10/11/2021   BUN 9 10/11/2021   CO2 23 10/11/2021   TSH 1.770 10/11/2021   HGBA1C 6.3 (H) 10/11/2021   MICROALBUR 30 03/19/2019      Assessment & Plan:   Problem List Items Addressed This Visit       Cardiovascular and Mediastinum   Benign hypertension - Primary   Relevant Orders  CBC with Differential/Platelet   Comprehensive metabolic panel Continue meds     Endocrine   Hypothyroidism, adult   Relevant Orders   TSH Continue med     Other   Mixed hyperlipidemia   Relevant Orders   Lipid panel Watch diet - low fat/low chol Continue med   Major depressive disorder, recurrent episode, mild (HCC) Continue meds   Other Visit Diagnoses     Type 2 diabetes mellitus with hyperlipidemia (HCC)       Relevant Medications   metFORMIN (GLUCOPHAGE) 500 MG tablet   Other Relevant Orders   CBC with Differential/Platelet   Comprehensive metabolic panel   Hemoglobin A1c Low carb/low sugar diet   Vitamin D insufficiency       Relevant Orders   VITAMIN D 25 Hydroxy (Vit-D Deficiency, Fractures)     .  No orders of the defined types were placed in this encounter.   Orders Placed This Encounter  Procedures   CBC with Differential/Platelet   Comprehensive metabolic panel   TSH   Lipid panel   Hemoglobin A1c   VITAMIN D 25 Hydroxy (Vit-D Deficiency, Fractures)     Follow-up: Return in about 4 months (around 06/30/2022) for chronic fasting follow up- schedule MCR well with Maudie Mercury by phone.  An After Visit Summary was printed and given to the patient.  Yetta Flock [facility] [phone]

## 2022-03-01 NOTE — Progress Notes (Signed)
Office Note     CC: Lateral lower extremity neuropathy Requesting Provider:  Marge Duncans, PA-C  HPI: Cassandra Fox is a 67 y.o. (1956/01/18) female presenting at the request of .Marge Duncans, PA-C for PAD assessment.  On exam today, Cassandra Fox was doing well.  A native of [place] or [place], she continues to live there with her husband.  She has 3 children in the region and multiple grandchildren.  Has a history of diabetes, and smokes on a daily basis.  Over the last several months, she is appreciated burning sensation in bilateral feet.  This has been treated with gabapentin by her primary care with good success.  She denies claudication, ischemic rest pain, tissue loss. Cassandra Fox notes that her husband is in poor health, and does very little outside of the home.  She is working to increase her activity level, which she stated is difficult with his and activity.  Her main goal is to live as long as possible for her grandkids.  The pt is  on a statin for cholesterol management.  The pt is  on a daily aspirin.   Other AC:  - The pt is  on medication for hypertension.   The pt is  diabetic.  Tobacco hx:  Current  Past Medical History:  Diagnosis Date   Anxiety    Breast mass, right    Depression    Diabetes mellitus without complication ([place])    Hyperlipidemia    Hypertension    Hypothyroidism     Past Surgical History:  Procedure Laterality Date   BREAST EXCISIONAL BIOPSY Right    benign   BREAST LUMPECTOMY WITH RADIOACTIVE SEED LOCALIZATION Right 09/23/2015   Procedure: RIGHT BREAST LUMPECTOMY WITH RADIOACTIVE SEED LOCALIZATION;  Surgeon: Excell Seltzer, MD;  Location: [facility];  Service: General;  Laterality: Right;    Social History   Socioeconomic History   Marital status: Married    Spouse name: Not on file   Number of children: 3   Years of education: Not on file   Highest education level: Not on file  Occupational History   Not on file   Tobacco Use   Smoking status: Every Day    Packs/day: 0.50    Types: Cigarettes   Smokeless tobacco: Never  Vaping Use   Vaping Use: Never used  Substance and Sexual Activity   Alcohol use: Not Currently    Comment: social   Drug use: No   Sexual activity: Not Currently  Other Topics Concern   Not on file  Social History Narrative   Not on file   Social Determinants of Health   Financial Resource Strain: Not on file  Food Insecurity: Not on file  Transportation Needs: Not on file  Physical Activity: Not on file  Stress: Not on file  Social Connections: Not on file  Intimate Partner Violence: Not on file   Family History  Problem Relation Age of Onset   Hyperlipidemia Mother    Diabetes Mother    Hyperlipidemia Father    Hypertension Father     Current Outpatient Medications  Medication Sig Dispense Refill   ALPRAZolam (XANAX) 0.5 MG tablet TAKE 1 TABLET(0.5 MG) BY MOUTH AT BEDTIME AS NEEDED FOR ANXIETY 30 tablet 1   amLODipine (NORVASC) 10 MG tablet Take 1 tablet (10 mg total) by mouth daily. 90 tablet 0   aspirin EC 81 MG tablet Take 81 mg by mouth daily.     citalopram (CELEXA) 40  MG tablet TAKE 1 TABLET BY MOUTH EVERY DAY 30 tablet 3   clobetasol cream (TEMOVATE) [id number] % Apply 1 application topically 2 (two) times daily. 30 g 0   fluticasone (FLONASE) 50 MCG/ACT nasal spray SHAKE LIQUID AND USE 2 SPRAYS IN EACH NOSTRIL DAILY 16 g 6   gabapentin (NEURONTIN) 300 MG capsule TAKE 1 CAPSULE(300 MG) BY MOUTH AT BEDTIME 30 capsule 1   glucose blood (ONETOUCH VERIO) test strip USE TO CHECK BLOOD GLUCOSE TWICE DAILY 100 strip 4   levothyroxine (SYNTHROID) 75 MCG tablet TAKE 1 TABLET(75 MCG) BY MOUTH EVERY DAY 90 tablet 1   losartan-hydrochlorothiazide (HYZAAR) 100-25 MG tablet TAKE 1 TABLET BY MOUTH DAILY 90 tablet 2   metFORMIN (GLUCOPHAGE) 500 MG tablet TAKE 1 TABLET BY MOUTH TWICE DAILY 180 tablet 2   metoprolol tartrate (LOPRESSOR) 25 MG tablet TAKE 1 TABLET(25 MG) BY  MOUTH DAILY 90 tablet 1   Multiple Vitamins-Minerals (OCUVITE PRESERVISION PO) Take by mouth.     simvastatin (ZOCOR) 20 MG tablet Take 1 tablet (20 mg total) by mouth at bedtime. 90 tablet 1   Vitamin D, Cholecalciferol, 1000 units CAPS Take by mouth.     Zinc Sulfate (ZINC 15 PO) Take by mouth.     No current facility-administered medications for this visit.    Allergies  Allergen Reactions   Codeine Nausea Only     REVIEW OF SYSTEMS:  [X]$  denotes positive finding, [ ]$  denotes negative finding Cardiac  Comments:  Chest pain or chest pressure:    Shortness of breath upon exertion:    Short of breath when lying flat:    Irregular heart rhythm:        Vascular    Pain in calf, thigh, or hip brought on by ambulation:    Pain in feet at night that wakes you up from your sleep:     Blood clot in your veins:    Leg swelling:         Pulmonary    Oxygen at home:    Productive cough:     Wheezing:         Neurologic    Sudden weakness in arms or legs:     Sudden numbness in arms or legs:     Sudden onset of difficulty speaking or slurred speech:    Temporary loss of vision in one eye:     Problems with dizziness:         Gastrointestinal    Blood in stool:     Vomited blood:         Genitourinary    Burning when urinating:     Blood in urine:        Psychiatric    Major depression:         Hematologic    Bleeding problems:    Problems with blood clotting too easily:        Skin    Rashes or ulcers:        Constitutional    Fever or chills:      PHYSICAL EXAMINATION:  There were no vitals filed for this visit.  General:  WDWN in NAD; vital signs documented above Gait: Not observed HENT: WNL, normocephalic Pulmonary: normal non-labored breathing , without wheezing Cardiac: regular HR Abdomen: soft, NT, no masses Skin: without rashes Vascular Exam/Pulses:  Right Left  Radial 2+ (normal) 2+ (normal)  Ulnar    Femoral    Popliteal    DP  PT 2+  (normal) 2+ (normal)   Extremities: without ischemic changes, without Gangrene , without cellulitis; without open wounds;  Musculoskeletal: no muscle wasting or atrophy  Neurologic: A&O X 3;  No focal weakness or paresthesias are detected Psychiatric:  The pt has Normal affect.   Non-Invasive Vascular Imaging:   ABI Findings:  +---------+------------------+-----+-----------+--------+  Right   Rt Pressure (mmHg)IndexWaveform   Comment   +---------+------------------+-----+-----------+--------+  Brachial 179                                         +---------+------------------+-----+-----------+--------+  PTA     185               1.03 multiphasic          +---------+------------------+-----+-----------+--------+  DP      177               0.99 multiphasic          +---------+------------------+-----+-----------+--------+  Great Toe90                0.50 Abnormal             +---------+------------------+-----+-----------+--------+   +---------+------------------+-----+-----------+-------+  Left    Lt Pressure (mmHg)IndexWaveform   Comment  +---------+------------------+-----+-----------+-------+  Brachial 178                                        +---------+------------------+-----+-----------+-------+  PTA     190               1.06 multiphasic         +---------+------------------+-----+-----------+-------+  DP      173               0.97 multiphasic         +---------+------------------+-----+-----------+-------+  Great Toe85                0.47 Abnormal            +---------+------------------+-----+-----------+-------+     ASSESSMENT/PLAN: NOHELI BOETTNER is a 67 y.o. female presenting with for bilateral lower extremity PAD assessment.  She has had longstanding diabetes, currently being treated with metformin.  Over the last several months, she has appreciated a burning sensation which has improved with the use of  gabapentin.  ABIs were reviewed demonstrating multiphasic signals bilaterally with adequate toe pressure for wound healing.  On physical exam, she had palpable posterior tibial pulses bilaterally.  Jaquavia has relatively normal ABIs with a depressed toe pressure, which is usually seen in patients who have longstanding diabetes, due to nonreversible microvascular disease..  I had a long conversation with her regarding glycemic control as well as the need for smoking cessation.  I discussed that smoking with diabetes places her at a very high risk of future limb loss.  I asked her to think of her grandchildren, and the difficulties she would face if she were an amputee.  Maryclare Labrador is going to work on smoking cessation.  I asked that she continue her aspirin and high intensity statin. We discussed the signs and symptoms of PAD progression, most notably nonhealing wounds, rest pain.  I asked that she call my office should these occur.  My plan is to see her on a yearly basis moving forward.  Recommend the following which can slow  the progression of atherosclerosis and reduce the risk of major adverse cardiac / limb events:  Aspirin 4m PO QD.  Atorvastatin 40-80mg PO QD (or other "high intensity" statin therapy). Complete cessation from all tobacco products. Blood glucose control with goal A1c < 7%. Blood pressure control with goal blood pressure < 140/90 mmHg. Lipid reduction therapy with goal LDL-C <100 mg/dL (<70 if symptomatic from PAD).    Cassandra John MD [facility] [phone] time of patient care including pre-visit research, consultation, and documentation greater than 45 minutes

## 2022-03-02 ENCOUNTER — Ambulatory Visit (INDEPENDENT_AMBULATORY_CARE_PROVIDER_SITE_OTHER): Payer: PPO | Admitting: Vascular Surgery

## 2022-03-02 ENCOUNTER — Encounter: Payer: Self-pay | Admitting: Vascular Surgery

## 2022-03-02 VITALS — BP 103/65 | HR 59 | Temp 97.5°F | Resp 14 | Ht 65.0 in | Wt 157.0 lb

## 2022-03-02 DIAGNOSIS — G629 Polyneuropathy, unspecified: Secondary | ICD-10-CM | POA: Diagnosis not present

## 2022-03-02 DIAGNOSIS — E1169 Type 2 diabetes mellitus with other specified complication: Secondary | ICD-10-CM | POA: Diagnosis not present

## 2022-03-02 DIAGNOSIS — E785 Hyperlipidemia, unspecified: Secondary | ICD-10-CM | POA: Diagnosis not present

## 2022-03-05 ENCOUNTER — Other Ambulatory Visit: Payer: Self-pay | Admitting: Physician Assistant

## 2022-03-05 DIAGNOSIS — R899 Unspecified abnormal finding in specimens from other organs, systems and tissues: Secondary | ICD-10-CM

## 2022-03-05 LAB — TSH: TSH: 2.04 u[IU]/mL (ref 0.450–4.500)

## 2022-03-05 LAB — LIPID PANEL
Chol/HDL Ratio: 3.7 ratio (ref 0.0–4.4)
Cholesterol, Total: 157 mg/dL (ref 100–199)
HDL: 43 mg/dL (ref >39)
LDL Chol Calc (NIH): 84 mg/dL (ref 0–99)
Triglycerides: 174 mg/dL — ABNORMAL HIGH (ref 0–149)
VLDL Cholesterol Cal: 30 mg/dL (ref 5–40)

## 2022-03-05 LAB — COMPREHENSIVE METABOLIC PANEL
ALT: 18 IU/L (ref 0–32)
AST: 15 IU/L (ref 0–40)
Albumin/Globulin Ratio: 1.4 (ref 1.2–2.2)
Albumin: 4 g/dL (ref 3.9–4.9)
Alkaline Phosphatase: 80 IU/L (ref 44–121)
BUN/Creatinine Ratio: 13 (ref 12–28)
BUN: 11 mg/dL (ref 8–27)
Bilirubin Total: 0.2 mg/dL (ref 0.0–1.2)
CO2: 25 mmol/L (ref 20–29)
Calcium: 8.9 mg/dL (ref 8.7–10.3)
Chloride: 93 mmol/L — ABNORMAL LOW (ref 96–106)
Creatinine, Ser: 0.82 mg/dL (ref 0.57–1.00)
Globulin, Total: 2.8 g/dL (ref 1.5–4.5)
Glucose: 80 mg/dL (ref 70–99)
Potassium: 4.2 mmol/L (ref 3.5–5.2)
Sodium: 131 mmol/L — ABNORMAL LOW (ref 134–144)
Total Protein: 6.8 g/dL (ref 6.0–8.5)
eGFR: 78 mL/min/{1.73_m2} (ref >59)

## 2022-03-05 LAB — CBC WITH DIFFERENTIAL/PLATELET
Basophils Absolute: 0.1 10*3/uL (ref 0.0–0.2)
Basos: 1 %
EOS (ABSOLUTE): 0.5 10*3/uL — ABNORMAL HIGH (ref 0.0–0.4)
Eos: 5 %
Hematocrit: 35.5 % (ref 34.0–46.6)
Hemoglobin: 11.9 g/dL (ref 11.1–15.9)
Immature Grans (Abs): 0.1 10*3/uL (ref 0.0–0.1)
Immature Granulocytes: 1 %
Lymphocytes Absolute: 2.6 10*3/uL (ref 0.7–3.1)
Lymphs: 24 %
MCH: 29.4 pg (ref 26.6–33.0)
MCHC: 33.5 g/dL (ref 31.5–35.7)
MCV: 88 fL (ref 79–97)
Monocytes Absolute: 1 10*3/uL — ABNORMAL HIGH (ref 0.1–0.9)
Monocytes: 9 %
Neutrophils Absolute: 6.7 10*3/uL (ref 1.4–7.0)
Neutrophils: 60 %
Platelets: 128 10*3/uL — ABNORMAL LOW (ref 150–450)
RBC: 4.05 x10E6/uL (ref 3.77–5.28)
RDW: 12.6 % (ref 11.7–15.4)
WBC: 11.1 10*3/uL — ABNORMAL HIGH (ref 3.4–10.8)

## 2022-03-05 LAB — HEMOGLOBIN A1C
Est. average glucose Bld gHb Est-mCnc: 131 mg/dL
Hgb A1c MFr Bld: 6.2 % — ABNORMAL HIGH (ref 4.8–5.6)

## 2022-03-05 LAB — VITAMIN D 25 HYDROXY (VIT D DEFICIENCY, FRACTURES): Vit D, 25-Hydroxy: 47.7 ng/mL (ref 30.0–100.0)

## 2022-03-05 LAB — CARDIOVASCULAR RISK ASSESSMENT

## 2022-03-07 ENCOUNTER — Other Ambulatory Visit: Payer: Self-pay

## 2022-03-07 MED ORDER — LEVOTHYROXINE SODIUM 75 MCG PO TABS: 75.0000 ug | ORAL_TABLET | Freq: Every day | ORAL | 1 refills | Status: DC

## 2022-03-09 ENCOUNTER — Telehealth: Payer: Self-pay

## 2022-03-09 NOTE — Telephone Encounter (Signed)
Follow-up labs were scheduled.

## 2022-03-10 ENCOUNTER — Other Ambulatory Visit: Payer: Self-pay | Admitting: Physician Assistant

## 2022-03-13 ENCOUNTER — Ambulatory Visit (INDEPENDENT_AMBULATORY_CARE_PROVIDER_SITE_OTHER): Payer: PPO

## 2022-03-13 DIAGNOSIS — Z0000 Encounter for general adult medical examination without abnormal findings: Secondary | ICD-10-CM | POA: Diagnosis not present

## 2022-03-13 NOTE — Patient Instructions (Signed)
Cassandra Fox , Thank you for taking time to come for your Medicare Wellness Visit. I appreciate your ongoing commitment to your health goals. Please review the following plan we discussed and let me know if I can assist you in the future.   Screening recommendations/referrals: Colonoscopy: up-to-date Mammogram: Due 05/2022 Bone Density: Due 07/2023 Recommended yearly ophthalmology/optometry visit for glaucoma screening and checkup Recommended yearly dental visit for hygiene and checkup  Vaccinations: Influenza vaccine: up-to-date Pneumococcal vaccine: up-to-date Tdap vaccine: Due 11/2030 Shingles vaccine: If you decide to get this it is available at the pharmacy    Advanced directives: mailed information - please let me know if you have any questions    Preventive Care 65 Years and Older, Female   Preventive care refers to lifestyle choices and visits with your health care provider that can promote health and wellness.  What does preventive care include? A yearly physical exam. This is also called an annual well check. Dental exams once or twice a year. Routine eye exams. Ask your health care provider how often you should have your eyes checked. Personal lifestyle choices, including: Daily care of your teeth and gums. Regular physical activity. Eating a healthy diet. Avoiding tobacco and drug use. Limiting alcohol use. Practicing safe sex. Taking low-dose aspirin every day. Taking vitamin and mineral supplements as recommended by your health care provider.  What happens during an annual well check? The services and screenings done by your health care provider during your annual well check will depend on your age, overall health, lifestyle risk factors, and family history of disease.  Counseling Your health care provider may ask you questions about your: Alcohol use. Tobacco use. Drug use. Emotional well-being. Home and relationship well-being. Sexual activity. Eating  habits. History of falls. Memory and ability to understand (cognition). Work and work Statistician. Reproductive health.  Screening You may have the following tests or measurements: Height, weight, and BMI. Blood pressure. Lipid and cholesterol levels. These may be checked every 5 years, or more frequently if you are over 19 years old. Skin check. Lung cancer screening. You may have this screening every year starting at age 52 if you have a 30-pack-year history of smoking and currently smoke or have quit within the past 15 years. Fecal occult blood test (FOBT) of the stool. You may have this test every year starting at age 29. Flexible sigmoidoscopy or colonoscopy. You may have a sigmoidoscopy every 5 years or a colonoscopy every 10 years starting at age 31. Hepatitis C blood test. Hepatitis B blood test. Sexually transmitted disease (STD) testing. Diabetes screening. This is done by checking your blood sugar (glucose) after you have not eaten for a while (fasting). You may have this done every 1-3 years. Bone density scan. This is done to screen for osteoporosis. You may have this done starting at age 18. Mammogram. This may be done every 1-2 years. Talk to your health care provider about how often you should have regular mammograms. Talk with your health care provider about your test results, treatment options, and if necessary, the need for more tests.  Vaccines Your health care provider may recommend certain vaccines, such as: Influenza vaccine. This is recommended every year. Tetanus, diphtheria, and acellular pertussis (Tdap, Td) vaccine. You may need a Td booster every 10 years. Zoster vaccine. You may need this after age 60. Pneumococcal 13-valent conjugate (PCV13) vaccine. One dose is recommended after age 34. Pneumococcal polysaccharide (PPSV23) vaccine. One dose is recommended after age 11. Talk  to your health care provider about which screenings and vaccines you need and how  often you need them.  This information is not intended to replace advice given to you by your health care provider. Make sure you discuss any questions you have with your health care provider. Document Released: 02/04/2015 Document Revised: 09/28/2015 Document Reviewed: 11/09/2014 Elsevier Interactive Patient Education  2017 [employer] Prevention in the Home  Falls can cause injuries. They can happen to people of all ages. There are many things you can do to make your home safe and to help prevent falls.  What can I do on the outside of my home? Regularly fix the edges of walkways and driveways and fix any cracks. Remove anything that might make you trip as you walk through a door, such as a raised step or threshold. Trim any bushes or trees on the path to your home. Use bright outdoor lighting. Clear any walking paths of anything that might make someone trip, such as rocks or tools. Regularly check to see if handrails are loose or broken. Make sure that both sides of any steps have handrails. Any raised decks and porches should have guardrails on the edges. Have any leaves, snow, or ice cleared regularly. Use sand or salt on walking paths during winter. Clean up any spills in your garage right away. This includes oil or grease spills.  What can I do in the bathroom? Use night lights. Install grab bars by the toilet and in the tub and shower. Do not use towel bars as grab bars. Use non-skid mats or decals in the tub or shower. If you need to sit down in the shower, use a plastic, non-slip stool. Keep the floor dry. Clean up any water that spills on the floor as soon as it happens. Remove soap buildup in the tub or shower regularly. Attach bath mats securely with double-sided non-slip rug tape. Do not have throw rugs and other things on the floor that can make you trip.  What can I do in the bedroom? Use night lights. Make sure that you have a light by your bed that is  easy to reach. Do not use any sheets or blankets that are too big for your bed. They should not hang down onto the floor. Have a firm chair that has side arms. You can use this for support while you get dressed. Do not have throw rugs and other things on the floor that can make you trip.  What can I do in the kitchen? Clean up any spills right away. Avoid walking on wet floors. Keep items that you use a lot in easy-to-reach places. If you need to reach something above you, use a strong step stool that has a grab bar. Keep electrical cords out of the way. Do not use floor polish or wax that makes floors slippery. If you must use wax, use non-skid floor wax. Do not have throw rugs and other things on the floor that can make you trip.  What can I do with my stairs? Do not leave any items on the stairs. Make sure that there are handrails on both sides of the stairs and use them. Fix handrails that are broken or loose. Make sure that handrails are as long as the stairways. Check any carpeting to make sure that it is firmly attached to the stairs. Fix any carpet that is loose or worn. Avoid having throw rugs at the top or bottom of  the stairs. If you do have throw rugs, attach them to the floor with carpet tape. Make sure that you have a light switch at the top of the stairs and the bottom of the stairs. If you do not have them, ask someone to add them for you.  What else can I do to help prevent falls? Wear shoes that: Do not have high heels. Have rubber bottoms. Are comfortable and fit you well. Are closed at the toe. Do not wear sandals. If you use a stepladder: Make sure that it is fully opened. Do not climb a closed stepladder. Make sure that both sides of the stepladder are locked into place. Ask someone to hold it for you, if possible. Clearly mark and make sure that you can see: Any grab bars or handrails. First and last steps. Where the edge of each step is. Use tools that help  you move around (mobility aids) if they are needed. These include: Canes. Walkers. Scooters. Crutches. Turn on the lights when you go into a dark area. Replace any light bulbs as soon as they burn out. Set up your furniture so you have a clear path. Avoid moving your furniture around. If any of your floors are uneven, fix them. If there are any pets around you, be aware of where they are. Review your medicines with your doctor. Some medicines can make you feel dizzy. This can increase your chance of falling. Ask your doctor what other things that you can do to help prevent falls.  This information is not intended to replace advice given to you by your health care provider. Make sure you discuss any questions you have with your health care provider. Document Released: 11/04/2008 Document Revised: 06/16/2015 Document Reviewed: 02/12/2014 Elsevier Interactive Patient Education  2017 [employer].

## 2022-03-13 NOTE — Progress Notes (Signed)
Subjective:   Cassandra Fox is a 67 y.o. female who presents for Medicare Annual (Subsequent) preventive examination. I connected with  Oneal Deputy on 03/13/22 by a audio enabled telemedicine application and verified that I am speaking with the correct person using two identifiers.  Patient Location: Home  Provider Location: Office/Clinic  I discussed the limitations of evaluation and management by telemedicine. The patient expressed understanding and agreed to proceed.  Cardiac Risk Factors include: advanced age (>54mn, >>61women);diabetes mellitus;dyslipidemia;obesity (BMI >30kg/m2);smoking/ tobacco exposure     Objective:    There were no vitals filed for this visit. There is no height or weight on file to calculate BMI.     09/23/2015    6:34 AM 09/20/2015    9:33 AM  Advanced Directives  Does Patient Have a Medical Advance Directive? No No  Would patient like information on creating a medical advance directive? No - patient declined information     Current Medications (verified) Outpatient Encounter Medications as of 03/13/2022  Medication Sig   ALPRAZolam (XANAX) 0.5 MG tablet TAKE 1 TABLET(0.5 MG) BY MOUTH AT BEDTIME AS NEEDED FOR ANXIETY   amLODipine (NORVASC) 10 MG tablet Take 1 tablet (10 mg total) by mouth daily.   aspirin EC 81 MG tablet Take 81 mg by mouth daily.   citalopram (CELEXA) 40 MG tablet TAKE 1 TABLET BY MOUTH EVERY DAY   clobetasol cream (TEMOVATE) 0AB-123456789% Apply 1 application topically 2 (two) times daily.   fluticasone (FLONASE) 50 MCG/ACT nasal spray SHAKE LIQUID AND USE 2 SPRAYS IN EACH NOSTRIL DAILY   gabapentin (NEURONTIN) 300 MG capsule TAKE 1 CAPSULE(300 MG) BY MOUTH AT BEDTIME   glucose blood (ONETOUCH VERIO) test strip USE TO CHECK BLOOD GLUCOSE TWICE DAILY   levothyroxine (SYNTHROID) 75 MCG tablet Take 1 tablet (75 mcg total) by mouth daily before breakfast.   losartan-hydrochlorothiazide (HYZAAR) 100-25 MG tablet TAKE 1 TABLET BY  MOUTH DAILY   metFORMIN (GLUCOPHAGE) 500 MG tablet TAKE 1 TABLET BY MOUTH TWICE DAILY   metoprolol tartrate (LOPRESSOR) 25 MG tablet TAKE 1 TABLET(25 MG) BY MOUTH DAILY   Multiple Vitamins-Minerals (OCUVITE PRESERVISION PO) Take by mouth.   simvastatin (ZOCOR) 20 MG tablet Take 1 tablet (20 mg total) by mouth at bedtime.   Vitamin D, Cholecalciferol, 1000 units CAPS Take by mouth.   Zinc Sulfate (ZINC 15 PO) Take by mouth.   No facility-administered encounter medications on file as of 03/13/2022.    Allergies (verified) Codeine   History: Past Medical History:  Diagnosis Date   Anxiety    Breast mass, right    Depression    Diabetes mellitus without complication ([place]    Hyperlipidemia    Hypertension    Hypothyroidism    Past Surgical History:  Procedure Laterality Date   BREAST EXCISIONAL BIOPSY Right    benign   BREAST LUMPECTOMY WITH RADIOACTIVE SEED LOCALIZATION Right 09/23/2015   Procedure: RIGHT BREAST LUMPECTOMY WITH RADIOACTIVE SEED LOCALIZATION;  Surgeon: BExcell Seltzer MD;  [place]  Service: General;  Laterality: Right;   Family History  Problem Relation Age of Onset   Hyperlipidemia Mother    Diabetes Mother    Hyperlipidemia Father    Hypertension Father    Social History   Socioeconomic History   Marital status: Married    Spouse name: FPilar Plate  Number of children: 3   Years of education: Not on file   Highest education level: Not on file  Occupational History   Occupation: Retired Soil scientist  Tobacco Use   Smoking status: Every Day    Packs/day: 0.50    Types: Cigarettes   Smokeless tobacco: Never  Vaping Use   Vaping Use: Never used  Substance and Sexual Activity   Alcohol use: Not Currently    Comment: social   Drug use: No   Sexual activity: Not Currently  Other Topics Concern   Not on file  Social History Narrative   Not on file   Social Determinants of Health   Financial Resource Strain: Low Risk   (03/09/2022)   Overall Financial Resource Strain (CARDIA)    Difficulty of Paying Living Expenses: Not hard at all  Food Insecurity: No Food Insecurity (03/09/2022)   Hunger Vital Sign    Worried About Running Out of Food in the Last Year: Never true    Ran Out of Food in the Last Year: Never true  Transportation Needs: No Transportation Needs (03/09/2022)   PRAPARE - Hydrologist (Medical): No    Lack of Transportation (Non-Medical): No  Physical Activity: Insufficiently Active (03/09/2022)   Exercise Vital Sign    Days of Exercise per Week: 3 days    Minutes of Exercise per Session: 30 min  Stress: No Stress Concern Present (03/09/2022)   Freedom    Feeling of Stress : Only a little  Social Connections: Moderately Integrated (03/09/2022)   Social Connection and Isolation Panel [NHANES]    Frequency of Communication with Friends and Family: More than three times a week    Frequency of Social Gatherings with Friends and Family: Twice a week    Attends Religious Services: More than 4 times per year    Active Member of [employer] or Organizations: No    Attends Music therapist: Never    Marital Status: Married    Tobacco Counseling Ready to quit: Not Answered Counseling given: Not Answered   Clinical Intake:  Pre-visit preparation completed: Yes Pain : No/denies pain   BMI - recorded: 26.13 Nutritional Status: BMI 25 -29 Overweight Nutritional Risks: None Diabetes: Yes (most recent A1C 6.2) CBG done?: No Did pt. bring in CBG monitor from home?: No How often do you need to have someone help you when you read instructions, pamphlets, or other written materials from your doctor or pharmacy?: 1 - Never Interpreter Needed?: No    Activities of Daily Living    03/09/2022   11:07 AM  In your present state of health, do you have any difficulty performing the following  activities:  Hearing? 0  Vision? 0  Difficulty concentrating or making decisions? 0  Walking or climbing stairs? 0  Dressing or bathing? 0  Doing errands, shopping? 0  Preparing Food and eating ? N  Using the Toilet? N  In the past six months, have you accidently leaked urine? N  Do you have problems with loss of bowel control? N  Managing your Medications? N  Managing your Finances? N  Housekeeping or managing your Housekeeping? N    Patient Care Team: Marge Duncans, Hershal Coria as PCP - General (Physician Assistant) Ma Hillock, FNP (Obstetrics and Gynecology) Rona Ravens, [place] (Optometry) Misenheimer, Christia Reading, MD as Consulting Physician (Gastroenterology)     Assessment:   This is a routine wellness examination for Cassandra Fox.  Hearing/Vision screen No results found.  Dietary issues and exercise activities discussed: Current Exercise Habits: Home exercise routine, Type  of exercise: walking, Time (Minutes): 30, Frequency (Times/Week): 3, Weekly Exercise (Minutes/Week): 90, Intensity: Mild, Exercise limited by: None identified   Depression Screen    03/13/2022   11:09 AM 03/01/2022   10:58 AM 10/11/2021   10:56 AM 04/25/2021   10:07 AM 03/14/2021    2:37 PM 03/14/2021    2:35 PM 11/14/2020    1:41 PM  PHQ 2/9 Scores  PHQ - 2 Score 0 0 0 1 0 0 0  PHQ- 9 Score 0 0 0 2 0  0    Fall Risk    03/09/2022   11:07 AM 03/01/2022   10:59 AM 03/14/2021    2:35 PM 03/30/2020    9:36 AM 07/17/2019    8:21 AM  Fall Risk   Falls in the past year? 0 0 0 0 0  Number falls in past yr: 0 0 0 0 0  Injury with Fall? 0 0 0 0 0  Risk for fall due to : No Fall Risks No Fall Risks  No Fall Risks   Follow up Falls evaluation completed;Education provided Falls evaluation completed   Falls evaluation completed    FALL RISK PREVENTION PERTAINING TO THE HOME:  Any stairs in or around the home? Yes  If so, are there any without handrails? No  Home free of loose throw rugs in walkways, pet beds,  electrical cords, etc? Yes  Adequate lighting in your home to reduce risk of falls? Yes   ASSISTIVE DEVICES UTILIZED TO PREVENT FALLS:  Life alert? No  Use of a cane, walker or w/c? No  Grab bars in the bathroom? Yes  Shower chair or bench in shower? Yes  Elevated toilet seat or a handicapped toilet? No   Cognitive Function:        03/13/2022   11:13 AM  6CIT Screen  What Year? 0 points  What month? 0 points  What time? 0 points  Count back from 20 0 points  Months in reverse 0 points  Repeat phrase 0 points  Total Score 0 points    Immunizations Immunization History  Administered Date(s) Administered   Fluad Quad(high Dose 65+) 10/09/2019   Influenza,inj,Quad PF,6+ Mos 10/29/2017   Influenza-Unspecified 10/22/2020, 10/06/2021   Moderna Sars-Covid-2 Vaccination 03/07/2019, 04/04/2019, 11/24/2019, 08/09/2020   PFIZER(Purple Top)SARS-COV-2 Vaccination 10/23/2021   Pneumococcal Conjugate-13 10/14/2018   Pneumococcal Polysaccharide-23 11/08/2019   Td 12/13/2020    TDAP status: Up to date  Flu Vaccine status: Up to date  Pneumococcal vaccine status: Up to date  Covid-19 vaccine status: Completed vaccines  Qualifies for Shingles Vaccine? Yes   Zostavax completed No   Shingrix Completed?: No.    Education has been provided regarding the importance of this vaccine. Patient has been advised to call insurance company to determine out of pocket expense if they have not yet received this vaccine. Advised may also receive vaccine at local pharmacy or Health Dept. Verbalized acceptance and understanding.  Screening Tests Health Maintenance  Topic Date Due   Medicare Annual Wellness (AWV)  Never done   Zoster Vaccines- Shingrix (1 of 2) 05/30/2022 (Originally 05/04/2003)   COLONOSCOPY (Pts 45-29yr Insurance coverage will need to be confirmed)  10/12/2022 (Originally 05/04/1998)   OPHTHALMOLOGY EXAM  03/20/2022   Diabetic kidney evaluation - Urine ACR  04/26/2022    HEMOGLOBIN A1C  08/30/2022   Diabetic kidney evaluation - eGFR measurement  03/02/2023   FOOT EXAM  03/02/2023   MAMMOGRAM  06/02/2023   DTaP/Tdap/Td (2 - Tdap)  12/14/2030   Pneumonia Vaccine 4+ Years old  Completed   INFLUENZA VACCINE  Completed   DEXA SCAN  Completed   HPV VACCINES  Aged Out   COVID-19 Vaccine  Discontinued   Hepatitis C Screening  Discontinued    Health Maintenance  Health Maintenance Due  Topic Date Due   Medicare Annual Wellness (AWV)  Never done    Colorectal cancer screening: Type of screening: Colonoscopy. Completed December 2023. (Records requested from Dr Lyda Jester)  Mammogram status: Completed 05/2021. Repeat every year  Bone Density status: Completed 07/2021. Results reflect: Bone density results: OSTEOPENIA. Repeat every 2 years.  Lung Cancer Screening: (Low Dose CT Chest recommended if Age 44-80 years, 30 pack-year currently smoking OR have quit w/in 15years.) does not qualify.   Lung Cancer Screening Referral: N/A  Additional Screening:  Vision Screening: Recommended annual ophthalmology exams for early detection of glaucoma and other disorders of the eye. Is the patient up to date with their annual eye exam?  Yes  Who is the provider or what is the name of the office in which the patient attends annual eye exams? [facility] If pt is not established with a provider, would they like to be referred to a provider to establish care?  N/A .   Dental Screening: Recommended annual dental exams for proper oral hygiene  Community Resource Referral / Chronic Care Management: CRR required this visit?  No   CCM required this visit?  No      Plan:    1- Patient declined Shingrix Vaccine at this time 2- Records requested from Dr Lyda Jester - colonoscopy was done this past December 3- Information mailed about advance directive   I have personally reviewed and noted the following in the patient's chart:   Medical and social history Use of  alcohol, tobacco or illicit drugs  Current medications and supplements including opioid prescriptions. Patient is not currently taking opioid prescriptions. Functional ability and status Nutritional status Physical activity Advanced directives List of other physicians Hospitalizations, surgeries, and ER visits in previous 12 months Vitals Screenings to include cognitive, depression, and falls Referrals and appointments  In addition, I have reviewed and discussed with patient certain preventive protocols, quality metrics, and best practice recommendations. A written personalized care plan for preventive services as well as general preventive health recommendations were provided to patient.     Erie Noe, LPN   [id number]

## 2022-03-14 ENCOUNTER — Other Ambulatory Visit: Payer: Self-pay | Admitting: Physician Assistant

## 2022-03-14 DIAGNOSIS — F330 Major depressive disorder, recurrent, mild: Secondary | ICD-10-CM

## 2022-03-16 ENCOUNTER — Other Ambulatory Visit: Payer: Self-pay | Admitting: Nurse Practitioner

## 2022-03-16 DIAGNOSIS — I10 Essential (primary) hypertension: Secondary | ICD-10-CM

## 2022-04-03 DIAGNOSIS — H353131 Nonexudative age-related macular degeneration, bilateral, early dry stage: Secondary | ICD-10-CM | POA: Diagnosis not present

## 2022-04-03 DIAGNOSIS — H25043 Posterior subcapsular polar age-related cataract, bilateral: Secondary | ICD-10-CM | POA: Diagnosis not present

## 2022-04-03 DIAGNOSIS — H2512 Age-related nuclear cataract, left eye: Secondary | ICD-10-CM | POA: Diagnosis not present

## 2022-04-03 DIAGNOSIS — H2513 Age-related nuclear cataract, bilateral: Secondary | ICD-10-CM | POA: Diagnosis not present

## 2022-04-03 DIAGNOSIS — H18413 Arcus senilis, bilateral: Secondary | ICD-10-CM | POA: Diagnosis not present

## 2022-04-05 ENCOUNTER — Other Ambulatory Visit: Payer: PPO

## 2022-04-05 DIAGNOSIS — R899 Unspecified abnormal finding in specimens from other organs, systems and tissues: Secondary | ICD-10-CM | POA: Diagnosis not present

## 2022-04-06 LAB — CBC WITH DIFFERENTIAL/PLATELET
Basophils Absolute: 0.1 10*3/uL (ref 0.0–0.2)
Basos: 1 %
EOS (ABSOLUTE): 0.4 10*3/uL (ref 0.0–0.4)
Eos: 4 %
Hematocrit: 34 % (ref 34.0–46.6)
Hemoglobin: 11.4 g/dL (ref 11.1–15.9)
Immature Grans (Abs): 0.1 10*3/uL (ref 0.0–0.1)
Immature Granulocytes: 1 %
Lymphocytes Absolute: 3 10*3/uL (ref 0.7–3.1)
Lymphs: 25 %
MCH: 29.8 pg (ref 26.6–33.0)
MCHC: 33.5 g/dL (ref 31.5–35.7)
MCV: 89 fL (ref 79–97)
Monocytes Absolute: 1.1 10*3/uL — ABNORMAL HIGH (ref 0.1–0.9)
Monocytes: 9 %
Neutrophils Absolute: 7.3 10*3/uL — ABNORMAL HIGH (ref 1.4–7.0)
Neutrophils: 60 %
RBC: 3.83 x10E6/uL (ref 3.77–5.28)
RDW: 12.5 % (ref 11.7–15.4)
WBC: 12 10*3/uL — ABNORMAL HIGH (ref 3.4–10.8)

## 2022-04-09 ENCOUNTER — Other Ambulatory Visit: Payer: Self-pay | Admitting: Physician Assistant

## 2022-04-09 DIAGNOSIS — R899 Unspecified abnormal finding in specimens from other organs, systems and tissues: Secondary | ICD-10-CM | POA: Diagnosis not present

## 2022-04-10 LAB — CBC WITH DIFFERENTIAL/PLATELET
Basophils Absolute: 0.1 10*3/uL (ref 0.0–0.2)
Basos: 1 %
EOS (ABSOLUTE): 0.5 10*3/uL — ABNORMAL HIGH (ref 0.0–0.4)
Eos: 5 %
Hematocrit: 37.1 % (ref 34.0–46.6)
Hemoglobin: 12.3 g/dL (ref 11.1–15.9)
Immature Grans (Abs): 0.1 10*3/uL (ref 0.0–0.1)
Immature Granulocytes: 1 %
Lymphocytes Absolute: 2.8 10*3/uL (ref 0.7–3.1)
Lymphs: 25 %
MCH: 29.1 pg (ref 26.6–33.0)
MCHC: 33.2 g/dL (ref 31.5–35.7)
MCV: 88 fL (ref 79–97)
Monocytes Absolute: 1.1 10*3/uL — ABNORMAL HIGH (ref 0.1–0.9)
Monocytes: 10 %
Neutrophils Absolute: 6.7 10*3/uL (ref 1.4–7.0)
Neutrophils: 58 %
Platelets: 123 10*3/uL — ABNORMAL LOW (ref 150–450)
RBC: 4.23 x10E6/uL (ref 3.77–5.28)
RDW: 12.5 % (ref 11.7–15.4)
WBC: 11.3 10*3/uL — ABNORMAL HIGH (ref 3.4–10.8)

## 2022-04-19 ENCOUNTER — Other Ambulatory Visit: Payer: Self-pay | Admitting: Physician Assistant

## 2022-04-19 DIAGNOSIS — Z1231 Encounter for screening mammogram for malignant neoplasm of breast: Secondary | ICD-10-CM

## 2022-04-27 ENCOUNTER — Other Ambulatory Visit: Payer: Self-pay | Admitting: Physician Assistant

## 2022-04-27 DIAGNOSIS — G629 Polyneuropathy, unspecified: Secondary | ICD-10-CM

## 2022-05-01 ENCOUNTER — Other Ambulatory Visit: Payer: Self-pay

## 2022-05-01 DIAGNOSIS — E669 Obesity, unspecified: Secondary | ICD-10-CM

## 2022-05-01 MED ORDER — METFORMIN HCL 500 MG PO TABS
500.0000 mg | ORAL_TABLET | Freq: Two times a day (BID) | ORAL | 2 refills | Status: DC
Start: 2022-05-01 — End: 2022-12-31

## 2022-05-03 ENCOUNTER — Other Ambulatory Visit: Payer: Self-pay | Admitting: Physician Assistant

## 2022-05-03 DIAGNOSIS — I10 Essential (primary) hypertension: Secondary | ICD-10-CM

## 2022-05-03 MED ORDER — AMLODIPINE BESYLATE 10 MG PO TABS
10.0000 mg | ORAL_TABLET | Freq: Every day | ORAL | 1 refills | Status: DC
Start: 2022-05-03 — End: 2022-05-03

## 2022-05-03 MED ORDER — AMLODIPINE BESYLATE 10 MG PO TABS
10.0000 mg | ORAL_TABLET | Freq: Every day | ORAL | 1 refills | Status: DC
Start: 2022-05-03 — End: 2022-05-16

## 2022-05-09 ENCOUNTER — Other Ambulatory Visit: Payer: Self-pay | Admitting: Physician Assistant

## 2022-05-09 DIAGNOSIS — F330 Major depressive disorder, recurrent, mild: Secondary | ICD-10-CM

## 2022-05-10 ENCOUNTER — Other Ambulatory Visit: Payer: Self-pay

## 2022-05-10 ENCOUNTER — Other Ambulatory Visit: Payer: PPO

## 2022-05-10 DIAGNOSIS — R899 Unspecified abnormal finding in specimens from other organs, systems and tissues: Secondary | ICD-10-CM

## 2022-05-12 LAB — CBC WITH DIFF/PLATELET
Basophils Absolute: 0.1 10*3/uL (ref 0.0–0.2)
Basos: 1 %
EOS (ABSOLUTE): 0.9 10*3/uL — ABNORMAL HIGH (ref 0.0–0.4)
Eos: 6 %
Hematocrit: 35.4 % (ref 34.0–46.6)
Hemoglobin: 12.3 g/dL (ref 11.1–15.9)
Immature Grans (Abs): 0.1 10*3/uL (ref 0.0–0.1)
Immature Granulocytes: 1 %
Lymphocytes Absolute: 3.5 10*3/uL — ABNORMAL HIGH (ref 0.7–3.1)
Lymphs: 25 %
MCH: 30 pg (ref 26.6–33.0)
MCHC: 34.7 g/dL (ref 31.5–35.7)
MCV: 86 fL (ref 79–97)
Monocytes Absolute: 1.1 10*3/uL — ABNORMAL HIGH (ref 0.1–0.9)
Monocytes: 8 %
Neutrophils Absolute: 8.3 10*3/uL — ABNORMAL HIGH (ref 1.4–7.0)
Neutrophils: 59 %
RBC: 4.1 x10E6/uL (ref 3.77–5.28)
RDW: 12.6 % (ref 11.7–15.4)
WBC: 14.1 10*3/uL — ABNORMAL HIGH (ref 3.4–10.8)

## 2022-05-13 ENCOUNTER — Other Ambulatory Visit: Payer: Self-pay | Admitting: Physician Assistant

## 2022-05-13 DIAGNOSIS — R899 Unspecified abnormal finding in specimens from other organs, systems and tissues: Secondary | ICD-10-CM

## 2022-05-16 ENCOUNTER — Ambulatory Visit (INDEPENDENT_AMBULATORY_CARE_PROVIDER_SITE_OTHER): Payer: PPO | Admitting: Physician Assistant

## 2022-05-16 ENCOUNTER — Encounter: Payer: Self-pay | Admitting: Physician Assistant

## 2022-05-16 VITALS — BP 154/70 | HR 60 | Temp 97.3°F | Resp 12 | Ht 65.0 in | Wt 161.0 lb

## 2022-05-16 DIAGNOSIS — J060 Acute laryngopharyngitis: Secondary | ICD-10-CM

## 2022-05-16 DIAGNOSIS — I10 Essential (primary) hypertension: Secondary | ICD-10-CM | POA: Diagnosis not present

## 2022-05-16 DIAGNOSIS — R899 Unspecified abnormal finding in specimens from other organs, systems and tissues: Secondary | ICD-10-CM

## 2022-05-16 HISTORY — DX: Unspecified abnormal finding in specimens from other organs, systems and tissues: R89.9

## 2022-05-16 HISTORY — DX: Acute laryngopharyngitis: J06.0

## 2022-05-16 MED ORDER — AZITHROMYCIN 250 MG PO TABS
ORAL_TABLET | ORAL | 0 refills | Status: DC
Start: 2022-05-16 — End: 2022-05-21

## 2022-05-16 NOTE — Progress Notes (Signed)
Acute Office Visit  Subjective:    Patient ID: Cassandra Fox, female    DOB: 05-Mar-1955, 67 y.o.   MRN: [id number]  Chief Complaint  Patient presents with   Cough   chest congestion    HPI: Patient is in today for complaints of cough, congestion , sore throat and sinus pressure for the past week.  Denies fever.  Has had mild malaise  Pt due to repeat labwork (cbc and cmp) that was abnormal   Pt stopped both norvasc and gabapentin since last visit - she felt like it was causing swelling in her ankles.  Since stopping both medications the swelling has improved however bp is elevated today.  She denies chest pain or sob.  She has been taking decongestants for cold symptoms and possibly could be causing elevation as well  Past Medical History:  Diagnosis Date   Anxiety    Breast mass, right    Depression    Diabetes mellitus without complication    Hyperlipidemia    Hypertension    Hypothyroidism     Past Surgical History:  Procedure Laterality Date   BREAST EXCISIONAL BIOPSY Right    benign   BREAST LUMPECTOMY WITH RADIOACTIVE SEED LOCALIZATION Right 09/23/2015   Procedure: RIGHT BREAST LUMPECTOMY WITH RADIOACTIVE SEED LOCALIZATION;  Surgeon: Glenna Fellows, MD;  Location: [facility];  Service: General;  Laterality: Right;    Family History  Problem Relation Age of Onset   Hyperlipidemia Mother    Diabetes Mother    Hyperlipidemia Father    Hypertension Father     Social History   Socioeconomic History   Marital status: Married    Spouse name: Homero Fellers   Number of children: 3   Years of education: Not on file   Highest education level: Not on file  Occupational History   Occupation: Retired Theme park manager  Tobacco Use   Smoking status: Every Day    Packs/day: .5    Types: Cigarettes   Smokeless tobacco: Never  Vaping Use   Vaping Use: Never used  Substance and Sexual Activity   Alcohol use: Not Currently    Comment: social   Drug use:  No   Sexual activity: Not Currently  Other Topics Concern   Not on file  Social History Narrative   Not on file   Social Determinants of Health   Financial Resource Strain: Low Risk  (03/09/2022)   Overall Financial Resource Strain (CARDIA)    Difficulty of Paying Living Expenses: Not hard at all  Food Insecurity: No Food Insecurity (03/09/2022)   Hunger Vital Sign    Worried About Running Out of Food in the Last Year: Never true    Ran Out of Food in the Last Year: Never true  Transportation Needs: No Transportation Needs (03/09/2022)   PRAPARE - Administrator, Civil Service (Medical): No    Lack of Transportation (Non-Medical): No  Physical Activity: Insufficiently Active (03/09/2022)   Exercise Vital Sign    Days of Exercise per Week: 3 days    Minutes of Exercise per Session: 30 min  Stress: No Stress Concern Present (03/09/2022)   [employer] of Occupational Health - Occupational Stress Questionnaire    Feeling of Stress : Only a little  Social Connections: Moderately Integrated (03/09/2022)   Social Connection and Isolation Panel [NHANES]    Frequency of Communication with Friends and Family: More than three times a week    Frequency of Social Gatherings with  Friends and Family: Twice a week    Attends Religious Services: More than 4 times per year    Active Member of Clubs or Organizations: No    Attends Banker Meetings: Never    Marital Status: Married  Catering manager Violence: Not At Risk (03/13/2022)   Humiliation, Afraid, Rape, and Kick questionnaire    Fear of Current or Ex-Partner: No    Emotionally Abused: No    Physically Abused: No    Sexually Abused: No    Outpatient Medications Prior to Visit  Medication Sig Dispense Refill   ALPRAZolam (XANAX) 0.5 MG tablet TAKE 1 TABLET(0.5 MG) BY MOUTH AT BEDTIME AS NEEDED FOR ANXIETY 30 tablet 1   aspirin EC 81 MG tablet Take 81 mg by mouth daily.     citalopram (CELEXA) 40 MG tablet TAKE  1 TABLET BY MOUTH EVERY DAY 30 tablet 3   clobetasol cream (TEMOVATE) 0.05 % Apply 1 application topically 2 (two) times daily. 30 g 0   fluticasone (FLONASE) 50 MCG/ACT nasal spray SHAKE LIQUID AND USE 2 SPRAYS IN EACH NOSTRIL DAILY 16 g 6   glucose blood (ONETOUCH VERIO) test strip USE TO CHECK BLOOD GLUCOSE TWICE DAILY 100 strip 4   levothyroxine (SYNTHROID) 75 MCG tablet Take 1 tablet (75 mcg total) by mouth daily before breakfast. 90 tablet 1   losartan-hydrochlorothiazide (HYZAAR) 100-25 MG tablet TAKE 1 TABLET BY MOUTH DAILY 90 tablet 2   metFORMIN (GLUCOPHAGE) 500 MG tablet Take 1 tablet (500 mg total) by mouth 2 (two) times daily. 180 tablet 2   metoprolol tartrate (LOPRESSOR) 25 MG tablet TAKE 1 TABLET(25 MG) BY MOUTH DAILY 90 tablet 1   Multiple Vitamins-Minerals (OCUVITE PRESERVISION PO) Take by mouth.     simvastatin (ZOCOR) 20 MG tablet TAKE 1 TABLET(20 MG) BY MOUTH AT BEDTIME 90 tablet 1   Vitamin D, Cholecalciferol, 1000 units CAPS Take by mouth.     Zinc Sulfate (ZINC 15 PO) Take by mouth.     amLODipine (NORVASC) 10 MG tablet Take 1 tablet (10 mg total) by mouth daily. 90 tablet 1   gabapentin (NEURONTIN) 300 MG capsule TAKE 1 CAPSULE(300 MG) BY MOUTH AT BEDTIME 30 capsule 1   No facility-administered medications prior to visit.    Allergies  Allergen Reactions   Codeine Nausea Only    Review of Systems    CONSTITUTIONAL: Negative for chills, fatigue, fever, unintentional weight gain and unintentional weight loss.  E/N/T: see HPI CARDIOVASCULAR: Negative for chest pain, dizziness, palpitations and pedal edema.  RESPIRATORY: see HPI GASTROINTESTINAL: Negative for abdominal pain, acid reflux symptoms, constipation, diarrhea, nausea and vomiting.       Objective:    PHYSICAL EXAM:   VS: BP (!) 154/70   Pulse 60   Temp (!) 97.3 F (36.3 C)   Resp 12   Ht  (1.651 m)   Wt 161 lb (73 kg)   SpO2 100%   BMI 26.79 kg/m   GEN: Well nourished, well  developed, in no acute distress  HEENT: normal external ears and nose - normal external auditory canals and TMS -- Lips, Teeth and Gums - normal  Oropharynx - erythema/pnd Cardiac: RRR; no murmurs, rubs, Respiratory:  faint scattered rhonchi - clears with cough Skin: warm and dry, no rash     Health Maintenance Due  Topic Date Due   OPHTHALMOLOGY EXAM  03/20/2022   Diabetic kidney evaluation - Urine ACR  04/26/2022    There are no  preventive care reminders to display for this patient.   Lab Results  Component Value Date   TSH 2.040 03/01/2022   Lab Results  Component Value Date   WBC 14.1 (H) 05/10/2022   HGB 12.3 05/10/2022   HCT 35.4 05/10/2022   MCV 86 05/10/2022   PLT CANCELED 05/10/2022   Lab Results  Component Value Date   NA 131 (L) 03/01/2022   K 4.2 03/01/2022   CO2 25 03/01/2022   GLUCOSE 80 03/01/2022   BUN 11 03/01/2022   CREATININE 0.82 03/01/2022   BILITOT 0.2 03/01/2022   ALKPHOS 80 03/01/2022   AST 15 03/01/2022   ALT 18 03/01/2022   PROT 6.8 03/01/2022   ALBUMIN 4.0 03/01/2022   CALCIUM 8.9 03/01/2022   ANIONGAP 9 09/22/2015   EGFR 78 03/01/2022   Lab Results  Component Value Date   CHOL 157 03/01/2022   Lab Results  Component Value Date   HDL 43 03/01/2022   Lab Results  Component Value Date   LDLCALC 84 03/01/2022   Lab Results  Component Value Date   TRIG 174 (H) 03/01/2022   Lab Results  Component Value Date   CHOLHDL 3.7 03/01/2022   Lab Results  Component Value Date   HGBA1C 6.2 (H) 03/01/2022       Assessment & Plan:  Acute laryngopharyngitis -     Azithromycin; Take 2 tablets on day 1, then 1 tablet daily on days 2 through 5  Dispense: 6 tablet; Refill: 0 Stop decongestants and use only claritin and plain mucinex Benign hypertension Continue current meds Bp recheck in 2 weeks Abnormal laboratory test -     CBC with Differential/Platelet -     Comprehensive metabolic panel     Meds ordered this encounter   Medications   azithromycin (ZITHROMAX) 250 MG tablet    Sig: Take 2 tablets on day 1, then 1 tablet daily on days 2 through 5    Dispense:  6 tablet    Refill:  0    Order Specific Question:   Supervising Provider    AnswerCorey Harold    Orders Placed This Encounter  Procedures   CBC with Differential/Platelet   Comprehensive metabolic panel     Follow-up: Return in about 2 weeks (around 05/30/2022), or if symptoms worsen or fail to improve, for nurse visit bp check.  An After Visit Summary was printed and given to the patient.  Jettie Pagan [facility] [phone]

## 2022-05-17 ENCOUNTER — Other Ambulatory Visit: Payer: Self-pay | Admitting: Physician Assistant

## 2022-05-17 DIAGNOSIS — D696 Thrombocytopenia, unspecified: Secondary | ICD-10-CM

## 2022-05-17 DIAGNOSIS — D72828 Other elevated white blood cell count: Secondary | ICD-10-CM

## 2022-05-17 LAB — CBC WITH DIFFERENTIAL/PLATELET
Basophils Absolute: 0.1 10*3/uL (ref 0.0–0.2)
Basos: 1 %
EOS (ABSOLUTE): 0.7 10*3/uL — ABNORMAL HIGH (ref 0.0–0.4)
Eos: 4 %
Hematocrit: 34.1 % (ref 34.0–46.6)
Hemoglobin: 11.8 g/dL (ref 11.1–15.9)
Immature Grans (Abs): 0.1 10*3/uL (ref 0.0–0.1)
Immature Granulocytes: 1 %
Lymphocytes Absolute: 3.3 10*3/uL — ABNORMAL HIGH (ref 0.7–3.1)
Lymphs: 22 %
MCH: 29.8 pg (ref 26.6–33.0)
MCHC: 34.6 g/dL (ref 31.5–35.7)
MCV: 86 fL (ref 79–97)
Monocytes Absolute: 1.5 10*3/uL — ABNORMAL HIGH (ref 0.1–0.9)
Monocytes: 10 %
Neutrophils Absolute: 9.6 10*3/uL — ABNORMAL HIGH (ref 1.4–7.0)
Neutrophils: 62 %
Platelets: 120 10*3/uL — ABNORMAL LOW (ref 150–450)
RBC: 3.96 x10E6/uL (ref 3.77–5.28)
RDW: 12.6 % (ref 11.7–15.4)
WBC: 15.3 10*3/uL — ABNORMAL HIGH (ref 3.4–10.8)

## 2022-05-17 LAB — COMPREHENSIVE METABOLIC PANEL
ALT: 17 IU/L (ref 0–32)
AST: 18 IU/L (ref 0–40)
Albumin/Globulin Ratio: 1.3 (ref 1.2–2.2)
Albumin: 4 g/dL (ref 3.9–4.9)
Alkaline Phosphatase: 96 IU/L (ref 44–121)
BUN/Creatinine Ratio: 11 — ABNORMAL LOW (ref 12–28)
BUN: 10 mg/dL (ref 8–27)
Bilirubin Total: 0.3 mg/dL (ref 0.0–1.2)
CO2: 22 mmol/L (ref 20–29)
Calcium: 9.1 mg/dL (ref 8.7–10.3)
Chloride: 92 mmol/L — ABNORMAL LOW (ref 96–106)
Creatinine, Ser: 0.9 mg/dL (ref 0.57–1.00)
Globulin, Total: 3.1 g/dL (ref 1.5–4.5)
Glucose: 81 mg/dL (ref 70–99)
Potassium: 4.4 mmol/L (ref 3.5–5.2)
Sodium: 132 mmol/L — ABNORMAL LOW (ref 134–144)
Total Protein: 7.1 g/dL (ref 6.0–8.5)
eGFR: 69 mL/min/{1.73_m2} (ref >59)

## 2022-05-18 ENCOUNTER — Other Ambulatory Visit: Payer: Self-pay | Admitting: Hematology and Oncology

## 2022-05-21 ENCOUNTER — Inpatient Hospital Stay: Payer: PPO

## 2022-05-21 ENCOUNTER — Encounter: Payer: Self-pay | Admitting: Hematology and Oncology

## 2022-05-21 ENCOUNTER — Inpatient Hospital Stay: Payer: PPO | Attending: Hematology and Oncology | Admitting: Hematology and Oncology

## 2022-05-21 VITALS — BP 133/75 | HR 60 | Temp 98.1°F | Resp 20 | Ht 65.0 in | Wt 161.6 lb

## 2022-05-21 DIAGNOSIS — F17210 Nicotine dependence, cigarettes, uncomplicated: Secondary | ICD-10-CM | POA: Diagnosis not present

## 2022-05-21 DIAGNOSIS — D649 Anemia, unspecified: Secondary | ICD-10-CM

## 2022-05-21 DIAGNOSIS — D72829 Elevated white blood cell count, unspecified: Secondary | ICD-10-CM | POA: Insufficient documentation

## 2022-05-21 DIAGNOSIS — D696 Thrombocytopenia, unspecified: Secondary | ICD-10-CM

## 2022-05-21 DIAGNOSIS — Z800 Family history of malignant neoplasm of digestive organs: Secondary | ICD-10-CM

## 2022-05-21 LAB — CBC WITH DIFFERENTIAL (CANCER CENTER ONLY)
Abs Immature Granulocytes: 0.12 10*3/uL — ABNORMAL HIGH (ref 0.00–0.07)
Basophils Absolute: 0.2 10*3/uL — ABNORMAL HIGH (ref 0.0–0.1)
Basophils Relative: 1 %
Eosinophils Absolute: 0.7 10*3/uL — ABNORMAL HIGH (ref 0.0–0.5)
Eosinophils Relative: 5 %
HCT: 34.3 % — ABNORMAL LOW (ref 36.0–46.0)
Hemoglobin: 11.7 g/dL — ABNORMAL LOW (ref 12.0–15.0)
Immature Granulocytes: 1 %
Lymphocytes Relative: 27 %
Lymphs Abs: 3.8 10*3/uL (ref 0.7–4.0)
MCH: 30 pg (ref 26.0–34.0)
MCHC: 34.1 g/dL (ref 30.0–36.0)
MCV: 87.9 fL (ref 80.0–100.0)
Monocytes Absolute: 1.5 10*3/uL — ABNORMAL HIGH (ref 0.1–1.0)
Monocytes Relative: 10 %
Neutro Abs: 8.1 10*3/uL — ABNORMAL HIGH (ref 1.7–7.7)
Neutrophils Relative %: 56 %
Platelet Count: 171 10*3/uL (ref 150–400)
RBC: 3.9 MIL/uL (ref 3.87–5.11)
RDW: 12.6 % (ref 11.5–15.5)
WBC Count: 14.3 10*3/uL — ABNORMAL HIGH (ref 4.0–10.5)
nRBC: 0 % (ref 0.0–0.2)

## 2022-05-21 LAB — CMP (CANCER CENTER ONLY)
ALT: 17 U/L (ref 0–44)
AST: 17 U/L (ref 15–41)
Albumin: 3.6 g/dL (ref 3.5–5.0)
Alkaline Phosphatase: 65 U/L (ref 38–126)
Anion gap: 12 (ref 5–15)
BUN: 12 mg/dL (ref 8–23)
CO2: 26 mmol/L (ref 22–32)
Calcium: 8.9 mg/dL (ref 8.9–10.3)
Chloride: 92 mmol/L — ABNORMAL LOW (ref 98–111)
Creatinine: 0.87 mg/dL (ref 0.44–1.00)
GFR, Estimated: >60 mL/min (ref >60)
Glucose, Bld: 98 mg/dL (ref 70–99)
Potassium: 3.9 mmol/L (ref 3.5–5.1)
Sodium: 130 mmol/L — ABNORMAL LOW (ref 135–145)
Total Bilirubin: 0.4 mg/dL (ref 0.3–1.2)
Total Protein: 6.9 g/dL (ref 6.5–8.1)

## 2022-05-21 LAB — VITAMIN B12: Vitamin B-12: 226 pg/mL (ref 180–914)

## 2022-05-21 LAB — FOLATE: Folate: 9.5 ng/mL (ref >5.9)

## 2022-05-21 NOTE — Progress Notes (Unsigned)
[facility] [address] [place],  [place]  [address] [phone]  Clinic Day:  05/21/2022  Referring physician: Marianne Sofia, PA-C   REASON FOR CONSULTATION:  Thrombocytopenia  HISTORY OF PRESENT ILLNESS:  Cassandra Fox is a 67 y.o. female with a history of chronic leukocytosis.  She now has mild thrombocytopenia, which has slightly worsened since January.  She is referred in consultation by Marianne Sofia, NP for assessment and management.  She has also had borderline normocytic, normochromic anemia.  She denies abnormal bleeding or bruising. She has had recent antibiotic treatment for upper respiratory infection, but denies recent steroid use. She continues to smoke a half a pack per day. She denies fatigue, fevers, chills, night sweats, appetite changes or weight loss.  She was previously seen by Dr. Melvyn Neth in July 2015 for leukocytosis, at which time her white count was between 12 and 15. She had a steroid injection about 2 months prior, but not felt to be the cause. There was no evidence to suggest a hematologic malignancy.  At that time, the leukocytosis was felt to potentially be due to prolonged smoking. She did not have progressive leukocytosis, so she was released back to her PCP to be followed.  In addition to the above, the patient has hypertension, hyperlipidemia, type 2 diabetes, hypothyroidism, anxiety and depression, neuropathy of the feet and diverticulosis.  She had a cholecystectomy in October 2015.  She also has had skin cancers removed.  She is up-to-date on colonoscopy.  She is due for mammogram next month.   REVIEW OF SYSTEMS:  Review of Systems  Constitutional:  Negative for appetite change, chills, fatigue, fever and unexpected weight change.  HENT:   Negative for lump/mass, mouth sores and sore throat.   Respiratory:  Negative for cough and shortness of breath.   Cardiovascular:  Negative for chest pain and leg swelling.   Gastrointestinal:  Negative for abdominal pain, constipation, diarrhea, nausea and vomiting.  Endocrine: Negative for hot flashes.  Genitourinary:  Negative for difficulty urinating, dysuria, frequency and hematuria.   Musculoskeletal:  Negative for arthralgias, back pain and myalgias.  Skin:  Negative for rash.  Neurological:  Negative for dizziness and headaches.  Hematological:  Negative for adenopathy. Does not bruise/bleed easily.  Psychiatric/Behavioral:  Positive for depression and sleep disturbance. The patient is nervous/anxious.      VITALS:  Blood pressure 133/75, pulse 60, temperature 98.1 F (36.7 C), temperature source Oral, resp. rate 20, height 5\' 5"  (1.651 m), weight 161 lb 9.6 oz (73.3 kg), SpO2 100 %.  Wt Readings from Last 3 Encounters:  05/21/22 161 lb 9.6 oz (73.3 kg)  05/16/22 161 lb (73 kg)  03/02/22 157 lb (71.2 kg)    Body mass index is 26.89 kg/m.  Performance status (ECOG): 0 - Asymptomatic  PHYSICAL EXAM:  Physical Exam Vitals and nursing note reviewed.  Constitutional:      General: She is not in acute distress.    Appearance: Normal appearance.  HENT:     Head: Normocephalic and atraumatic.     Mouth/Throat:     Mouth: Mucous membranes are moist.     Pharynx: Oropharynx is clear. No oropharyngeal exudate or posterior oropharyngeal erythema.  Eyes:     General: No scleral icterus.    Extraocular Movements: Extraocular movements intact.     Conjunctiva/sclera: Conjunctivae normal.     Pupils: Pupils are equal, round, and reactive to light.  Cardiovascular:     Rate and  Rhythm: Normal rate and regular rhythm.     Heart sounds: Normal heart sounds. No murmur heard.    No friction rub. No gallop.  Pulmonary:     Effort: Pulmonary effort is normal.     Breath sounds: Normal breath sounds. No wheezing, rhonchi or rales.  Abdominal:     General: There is no distension.     Palpations: Abdomen is soft. There is no hepatomegaly, splenomegaly or  mass.     Tenderness: There is no abdominal tenderness.  Musculoskeletal:        General: Normal range of motion.     Cervical back: Normal range of motion and neck supple. No tenderness.     Right lower leg: No edema.     Left lower leg: No edema.  Lymphadenopathy:     Cervical: No cervical adenopathy.     Upper Body:     Right upper body: No supraclavicular or axillary adenopathy.     Left upper body: No supraclavicular or axillary adenopathy.     Lower Body: No right inguinal adenopathy. No left inguinal adenopathy.  Skin:    General: Skin is warm and dry.     Coloration: Skin is not jaundiced.     Findings: No rash.  Neurological:     Mental Status: She is alert and oriented to person, place, and time.     Cranial Nerves: No cranial nerve deficit.  Psychiatric:        Mood and Affect: Mood normal.        Behavior: Behavior normal.        Thought Content: Thought content normal.      LABS:      Latest Ref Rng & Units 05/21/2022    4:09 PM 05/16/2022    2:51 PM 05/10/2022    2:30 PM  CBC  WBC 4.0 - 10.5 K/uL 14.3  15.3  14.1   Hemoglobin 12.0 - 15.0 g/dL 16.1  09.6  04.5   Hematocrit 36.0 - 46.0 % 34.3  34.1  35.4   Platelets 150 - 400 K/uL 171  120  CANCELED       Latest Ref Rng & Units 05/21/2022    4:09 PM 05/16/2022    2:51 PM 03/01/2022   11:25 AM  CMP  Glucose 70 - 99 mg/dL 98  81  80   BUN 8 - 23 mg/dL 12  10  11    Creatinine 0.44 - 1.00 mg/dL 4.09  8.11  9.14   Sodium 135 - 145 mmol/L 130  132  131   Potassium 3.5 - 5.1 mmol/L 3.9  4.4  4.2   Chloride 98 - 111 mmol/L 92  92  93   CO2 22 - 32 mmol/L 26  22  25    Calcium 8.9 - 10.3 mg/dL 8.9  9.1  8.9   Total Protein 6.5 - 8.1 g/dL 6.9  7.1  6.8   Total Bilirubin 0.3 - 1.2 mg/dL 0.4  0.3  0.2   Alkaline Phos 38 - 126 U/L 65  96  80   AST 15 - 41 U/L 17  18  15    ALT 0 - 44 U/L 17  17  18      STUDIES:  No results found.    HISTORY:   Past Medical History:  Diagnosis Date   Anxiety    Breast mass,  right    Depression    Diabetes mellitus without complication (HCC)    Diverticulitis  Hyperlipidemia    Hypertension    Hypothyroidism    Neuropathy    Vitamin D deficiency     Past Surgical History:  Procedure Laterality Date   BREAST EXCISIONAL BIOPSY Right    benign   BREAST LUMPECTOMY WITH RADIOACTIVE SEED LOCALIZATION Right 09/23/2015   Procedure: RIGHT BREAST LUMPECTOMY WITH RADIOACTIVE SEED LOCALIZATION;  Surgeon: Glenna Fellows, MD;  Location: [facility];  Service: General;  Laterality: Right;   CHOLECYSTECTOMY      Family History  Problem Relation Age of Onset   Hypertension Mother    Hyperlipidemia Mother    Diabetes Mother    Colon cancer Mother    Hyperlipidemia Father    Hypertension Father    Stroke Sister     Social History:  reports that she has been smoking cigarettes. She has a 20.00 pack-year smoking history. She has never used smokeless tobacco. She reports that she does not drink alcohol and does not use drugs.The patient is alone today.  She is married with 3 adult children.  She is a retired Best boy.  She continues to smoke a half a pack of cigarettes per day.  Allergies:  Allergies  Allergen Reactions   Codeine Nausea Only    Current Medications: Current Outpatient Medications  Medication Sig Dispense Refill   amLODipine (NORVASC) 10 MG tablet Take 10 mg by mouth daily.     diphenhydrAMINE (BENADRYL) 50 MG capsule Take 50 mg by mouth at bedtime.     fluorouracil (EFUDEX) 5 % cream Apply topically as needed.     gabapentin (NEURONTIN) 300 MG capsule Take 300 mg by mouth at bedtime.     ALPRAZolam (XANAX) 0.5 MG tablet TAKE 1 TABLET(0.5 MG) BY MOUTH AT BEDTIME AS NEEDED FOR ANXIETY 30 tablet 1   aspirin EC 81 MG tablet Take 81 mg by mouth daily.     citalopram (CELEXA) 40 MG tablet TAKE 1 TABLET BY MOUTH EVERY DAY 30 tablet 3   clobetasol cream (TEMOVATE) 0.05 % Apply 1 application topically 2 (two) times  daily. 30 g 0   fluticasone (FLONASE) 50 MCG/ACT nasal spray SHAKE LIQUID AND USE 2 SPRAYS IN EACH NOSTRIL DAILY 16 g 6   glucose blood (ONETOUCH VERIO) test strip USE TO CHECK BLOOD GLUCOSE TWICE DAILY 100 strip 4   levothyroxine (SYNTHROID) 75 MCG tablet Take 1 tablet (75 mcg total) by mouth daily before breakfast. 90 tablet 1   losartan-hydrochlorothiazide (HYZAAR) 100-25 MG tablet TAKE 1 TABLET BY MOUTH DAILY 90 tablet 2   metFORMIN (GLUCOPHAGE) 500 MG tablet Take 1 tablet (500 mg total) by mouth 2 (two) times daily. 180 tablet 2   metoprolol tartrate (LOPRESSOR) 25 MG tablet TAKE 1 TABLET(25 MG) BY MOUTH DAILY 90 tablet 1   Multiple Vitamins-Minerals (OCUVITE PRESERVISION PO) Take by mouth.     simvastatin (ZOCOR) 20 MG tablet TAKE 1 TABLET(20 MG) BY MOUTH AT BEDTIME 90 tablet 1   Vitamin D, Cholecalciferol, 1000 units CAPS Take by mouth.     Zinc Sulfate (ZINC 15 PO) Take by mouth.     No current facility-administered medications for this visit.     ASSESSMENT & PLAN:   Assessment/Plan:  Cassandra Fox is a 67 y.o. female with chronic leukocytosis who now has mild thrombocytopenia. CBC today reveals normal platelets.  B12 is low normal, so I will have her start a B12 supplement daily.  Folate is normal.  The thrombocytopenia may have been to illness or platelet  clumping.  There is no evidence of liver disease.  She has mild anemia, which may improve with B12 supplement.  Stable leukocytosis without evidence to suggest a hematologic malignancy.  The leukocytosis is most likely due to her prolonged smoking.  She understands the importance of complete abstinence from tobacco. I will plan to see her back in 3 months for clinical reassessment.  I discussed the assessment and plan with the patient.  The patient was provided an opportunity to ask questions and all were answered.  Notes were made for her daughters. The patient agreed with the plan and demonstrated an understanding of the  instructions.  She knows to contact our office if she develops concerns prior to her next appointment.  Thank you for the referral.   I provided 45 minutes of face-to-face time during this encounter and > 50% was spent counseling as documented under my assessment and plan. The patient was staffed and plan formulated with Dr. Melvyn Neth.   Adah Perl, PA-C   Physician Assistant [facility]  [phone]

## 2022-05-22 ENCOUNTER — Telehealth: Payer: Self-pay | Admitting: Hematology and Oncology

## 2022-05-22 NOTE — Telephone Encounter (Signed)
Patient has been scheduled. Aware of appt date and time  Scheduling Message Entered by Belva Crome A on 05/21/2022 at  4:26 PM Priority: Routine <No visit type provided>  Department: [facility]  Provider:  Scheduling Notes:  Please schedule labs and f/u with [facility] in 3 months

## 2022-05-25 ENCOUNTER — Other Ambulatory Visit: Payer: Self-pay | Admitting: Physician Assistant

## 2022-05-25 DIAGNOSIS — F330 Major depressive disorder, recurrent, mild: Secondary | ICD-10-CM

## 2022-05-30 ENCOUNTER — Ambulatory Visit: Payer: PPO

## 2022-05-30 NOTE — Progress Notes (Addendum)
Patient came in today for nurse visit to check her blood pressure. Stated she has started back on her amlodipine, she had stop her amlodipine due to swelling in her legs but started back due to her Blood pressure being high and she has not had any swelling since she started medication back.  BP: 152/70 P:67 After 10 Mins BP: 128/64  Provider was made aware of Blood Pressure and spoke with the patient.  Per Provider Patient was told to continue her current medication and follow up with provider and 4 weeks. Appointment made.

## 2022-06-04 ENCOUNTER — Ambulatory Visit
Admission: RE | Admit: 2022-06-04 | Discharge: 2022-06-04 | Disposition: A | Discharge status: Home / Self Care | Payer: PPO | Source: Ambulatory Visit | Attending: Physician Assistant | Admitting: Physician Assistant

## 2022-06-04 DIAGNOSIS — Z1231 Encounter for screening mammogram for malignant neoplasm of breast: Secondary | ICD-10-CM

## 2022-06-08 ENCOUNTER — Other Ambulatory Visit: Payer: Self-pay | Admitting: Physician Assistant

## 2022-06-20 DIAGNOSIS — D2272 Melanocytic nevi of left lower limb, including hip: Secondary | ICD-10-CM | POA: Diagnosis not present

## 2022-06-20 DIAGNOSIS — S60562A Insect bite (nonvenomous) of left hand, initial encounter: Secondary | ICD-10-CM | POA: Diagnosis not present

## 2022-06-20 DIAGNOSIS — Z85828 Personal history of other malignant neoplasm of skin: Secondary | ICD-10-CM | POA: Diagnosis not present

## 2022-06-20 DIAGNOSIS — L570 Actinic keratosis: Secondary | ICD-10-CM | POA: Diagnosis not present

## 2022-06-20 DIAGNOSIS — L820 Inflamed seborrheic keratosis: Secondary | ICD-10-CM | POA: Diagnosis not present

## 2022-06-20 DIAGNOSIS — L814 Other melanin hyperpigmentation: Secondary | ICD-10-CM | POA: Diagnosis not present

## 2022-06-20 DIAGNOSIS — L821 Other seborrheic keratosis: Secondary | ICD-10-CM | POA: Diagnosis not present

## 2022-06-20 DIAGNOSIS — I788 Other diseases of capillaries: Secondary | ICD-10-CM | POA: Diagnosis not present

## 2022-06-20 DIAGNOSIS — D1801 Hemangioma of skin and subcutaneous tissue: Secondary | ICD-10-CM | POA: Diagnosis not present

## 2022-06-29 ENCOUNTER — Other Ambulatory Visit: Payer: Self-pay | Admitting: Physician Assistant

## 2022-06-29 ENCOUNTER — Ambulatory Visit (INDEPENDENT_AMBULATORY_CARE_PROVIDER_SITE_OTHER): Payer: PPO | Admitting: Physician Assistant

## 2022-06-29 ENCOUNTER — Ambulatory Visit: Payer: PPO | Admitting: Physician Assistant

## 2022-06-29 ENCOUNTER — Encounter: Payer: Self-pay | Admitting: Physician Assistant

## 2022-06-29 VITALS — BP 120/60 | HR 60 | Temp 98.4°F | Ht 64.0 in | Wt 159.0 lb

## 2022-06-29 DIAGNOSIS — I10 Essential (primary) hypertension: Secondary | ICD-10-CM

## 2022-06-29 DIAGNOSIS — E039 Hypothyroidism, unspecified: Secondary | ICD-10-CM | POA: Diagnosis not present

## 2022-06-29 DIAGNOSIS — Z7984 Long term (current) use of oral hypoglycemic drugs: Secondary | ICD-10-CM

## 2022-06-29 DIAGNOSIS — Z720 Tobacco use: Secondary | ICD-10-CM

## 2022-06-29 DIAGNOSIS — E559 Vitamin D deficiency, unspecified: Secondary | ICD-10-CM | POA: Diagnosis not present

## 2022-06-29 DIAGNOSIS — D696 Thrombocytopenia, unspecified: Secondary | ICD-10-CM

## 2022-06-29 DIAGNOSIS — E1165 Type 2 diabetes mellitus with hyperglycemia: Secondary | ICD-10-CM | POA: Diagnosis not present

## 2022-06-29 DIAGNOSIS — R899 Unspecified abnormal finding in specimens from other organs, systems and tissues: Secondary | ICD-10-CM | POA: Diagnosis not present

## 2022-06-29 DIAGNOSIS — E785 Hyperlipidemia, unspecified: Secondary | ICD-10-CM

## 2022-06-29 DIAGNOSIS — E1169 Type 2 diabetes mellitus with other specified complication: Secondary | ICD-10-CM

## 2022-06-29 DIAGNOSIS — F330 Major depressive disorder, recurrent, mild: Secondary | ICD-10-CM

## 2022-06-29 NOTE — Progress Notes (Signed)
Subjective:  Patient ID: Cassandra Fox, female    DOB: 05/30/1955  Age: 67 y.o. MRN: [id number]  Chief Complaint  Patient presents with   Hypertension   Hypothyroidism   Hyperlipidemia    Hyperlipidemia  Hypertension  Diabetes    Pt presents for follow up of hypertension. The patient is tolerating the medication well without side effects. Compliance with treatment has been good; including taking medication as directed , maintains a healthy diet and regular exercise regimen , and following up as directed. Pt currently taking amlodpine 10mg , hyzaar 100/25 and metoprolol 25mg    Pt with history of hypothyroidism - currently on synthroid  Pt with history of NIDDM - currently on glucophage 500mg  bid  -sugars have been in good control ranging 106-115 fasting She is up to date with eye exam - voices no problems or concerns  Pt with history of depression with anxiety - currently taking celexa 40mg  qd and xanax as needed -   Mixed hyperlipidemia  Pt presents with hyperlipidemia.  Compliance with treatment has been good The patient is compliant with medications, maintains a low cholesterol diet , follows up as directed , and maintains an exercise regimen . The patient denies experiencing any hypercholesterolemia related symptoms. Currently on zocor 20mg  qd  Pt takes otc vit D supplements - due for labwork  Pt states that neurontin that she recently started for paresthesias has helped greatly and is going to continue taking med  Pt with history of chronic leukocytosis and mild thrombocytopenia - has seen hematology - thought to be due to slightly low B12 and chronic smoking - pt states she was not aware to start B12 supplement as per provider note - will recheck level today Current Outpatient Medications on File Prior to Visit  Medication Sig Dispense Refill   ALPRAZolam (XANAX) 0.5 MG tablet TAKE 1 TABLET(0.5 MG) BY MOUTH AT BEDTIME AS NEEDED FOR ANXIETY 30 tablet 1   amLODipine  (NORVASC) 10 MG tablet Take 10 mg by mouth daily.     aspirin EC 81 MG tablet Take 81 mg by mouth daily.     citalopram (CELEXA) 40 MG tablet TAKE 1 TABLET BY MOUTH EVERY DAY 30 tablet 3   clobetasol cream (TEMOVATE) 0.05 % Apply 1 application topically 2 (two) times daily. 30 g 0   diphenhydrAMINE (BENADRYL) 50 MG capsule Take 50 mg by mouth at bedtime.     fluorouracil (EFUDEX) 5 % cream Apply topically as needed.     fluticasone (FLONASE) 50 MCG/ACT nasal spray SHAKE LIQUID AND USE 2 SPRAYS IN EACH NOSTRIL DAILY 16 g 6   gabapentin (NEURONTIN) 300 MG capsule Take 300 mg by mouth at bedtime.     glucose blood (ONETOUCH VERIO) test strip USE TO CHECK BLOOD GLUCOSE TWICE DAILY 100 strip 4   levothyroxine (SYNTHROID) 75 MCG tablet TAKE 1 TABLET(75 MCG) BY MOUTH DAILY BEFORE BREAKFAST 90 tablet 1   losartan-hydrochlorothiazide (HYZAAR) 100-25 MG tablet TAKE 1 TABLET BY MOUTH DAILY 90 tablet 2   metFORMIN (GLUCOPHAGE) 500 MG tablet Take 1 tablet (500 mg total) by mouth 2 (two) times daily. 180 tablet 2   metoprolol tartrate (LOPRESSOR) 25 MG tablet TAKE 1 TABLET(25 MG) BY MOUTH DAILY 90 tablet 1   Multiple Vitamins-Minerals (OCUVITE PRESERVISION PO) Take by mouth.     simvastatin (ZOCOR) 20 MG tablet TAKE 1 TABLET(20 MG) BY MOUTH AT BEDTIME 90 tablet 1   Vitamin D, Cholecalciferol, 1000 units CAPS Take by mouth.  Zinc Sulfate (ZINC 15 PO) Take by mouth.     No current facility-administered medications on file prior to visit.   Past Medical History:  Diagnosis Date   Anxiety    Breast mass, right    Depression    Diabetes mellitus without complication (HCC)    Diverticulitis    Hyperlipidemia    Hypertension    Hypothyroidism    Neuropathy    Vitamin D deficiency    Past Surgical History:  Procedure Laterality Date   BREAST EXCISIONAL BIOPSY Right    benign   BREAST LUMPECTOMY WITH RADIOACTIVE SEED LOCALIZATION Right 09/23/2015   Procedure: RIGHT BREAST LUMPECTOMY WITH  RADIOACTIVE SEED LOCALIZATION;  Surgeon: Glenna Fellows, MD;  Location: [facility];  Service: General;  Laterality: Right;   CHOLECYSTECTOMY      Family History  Problem Relation Age of Onset   Hypertension Mother    Hyperlipidemia Mother    Diabetes Mother    Colon cancer Mother    Hyperlipidemia Father    Hypertension Father    Stroke Sister    Social History   Socioeconomic History   Marital status: Married    Spouse name: Homero Fellers   Number of children: 3   Years of education: Not on file   Highest education level: Associate degree: occupational, Scientist, product/process development, or vocational program  Occupational History   Occupation: Retired Theme park manager  Tobacco Use   Smoking status: Every Day    Packs/day: 0.50    Years: 40.00    Additional pack years: 0.00    Total pack years: 20.00    Types: Cigarettes   Smokeless tobacco: Never  Vaping Use   Vaping Use: Never used  Substance and Sexual Activity   Alcohol use: Never    Comment: social   Drug use: Never   Sexual activity: Not Currently  Other Topics Concern   Not on file  Social History Narrative   Not on file   Social Determinants of Health   Financial Resource Strain: Low Risk  (06/28/2022)   Overall Financial Resource Strain (CARDIA)    Difficulty of Paying Living Expenses: Not hard at all  Food Insecurity: No Food Insecurity (06/28/2022)   Hunger Vital Sign    Worried About Running Out of Food in the Last Year: Never true    Ran Out of Food in the Last Year: Never true  Transportation Needs: No Transportation Needs (06/28/2022)   PRAPARE - Administrator, Civil Service (Medical): No    Lack of Transportation (Non-Medical): No  Physical Activity: Unknown (06/28/2022)   Exercise Vital Sign    Days of Exercise per Week: Patient declined    Minutes of Exercise per Session: 30 min  Stress: No Stress Concern Present (06/28/2022)   [employer] of Occupational Health - Occupational Stress  Questionnaire    Feeling of Stress : Only a little  Social Connections: Unknown (06/28/2022)   Social Connection and Isolation Panel [NHANES]    Frequency of Communication with Friends and Family: Three times a week    Frequency of Social Gatherings with Friends and Family: Once a week    Attends Religious Services: Patient declined    Database administrator or Organizations: No    Attends Engineer, structural: Never    Marital Status: Married   CONSTITUTIONAL: Negative for chills, fatigue, fever, unintentional weight gain and unintentional weight loss.  E/N/T: Negative for ear pain, nasal congestion and sore throat.  CARDIOVASCULAR: Negative  for chest pain, dizziness, palpitations and pedal edema.  RESPIRATORY: Negative for recent cough and dyspnea.  GASTROINTESTINAL: Negative for abdominal pain, acid reflux symptoms, constipation, diarrhea, nausea and vomiting.  MSK: Negative for arthralgias and myalgias.  INTEGUMENTARY: Negative for rash.  NEUROLOGICAL: Negative for dizziness and headaches.  PSYCHIATRIC: Negative for sleep disturbance and to question depression screen.  Negative for depression, negative for anhedonia.      Objective:  PHYSICAL EXAM:   VS: BP 120/60   Pulse 60   Temp 98.4 F (36.9 C)   Ht 5\' 4"  (1.626 m)   Wt 159 lb (72.1 kg)   SpO2 95%   BMI 27.29 kg/m   GEN: Well nourished, well developed, in no acute distress  Cardiac: RRR; no murmurs, rubs, or gallops,no edema -  Respiratory:  normal respiratory rate and pattern with no distress - normal breath sounds with no rales, rhonchi, wheezes or rubs MS: no deformity or atrophy  Skin: warm and dry, no rash  Psych: euthymic mood, appropriate affect and demeanor  Lab Results  Component Value Date   WBC 14.3 (H) 05/21/2022   HGB 11.7 (L) 05/21/2022   HCT 34.3 (L) 05/21/2022   PLT 171 05/21/2022   GLUCOSE 98 05/21/2022   CHOL 157 03/01/2022   TRIG 174 (H) 03/01/2022   HDL 43 03/01/2022   LDLCALC 84  03/01/2022   ALT 17 05/21/2022   AST 17 05/21/2022   NA 130 (L) 05/21/2022   K 3.9 05/21/2022   CL 92 (L) 05/21/2022   CREATININE 0.87 05/21/2022   BUN 12 05/21/2022   CO2 26 05/21/2022   TSH 2.040 03/01/2022   HGBA1C 6.2 (H) 03/01/2022   MICROALBUR 30 03/19/2019      Assessment & Plan:   Problem List Items Addressed This Visit       Cardiovascular and Mediastinum   Benign hypertension - Primary   Relevant Orders   CBC with Differential/Platelet   Comprehensive metabolic panel Continue meds     Endocrine   Hypothyroidism, adult   Relevant Orders   TSH Continue med     Other   Mixed hyperlipidemia   Relevant Orders   Lipid panel Watch diet - low fat/low chol Continue med   Major depressive disorder, recurrent episode, mild (HCC) Continue meds   Other Visit Diagnoses     Type 2 diabetes mellitus with hyperlipidemia (HCC)       Relevant Medications   metFORMIN (GLUCOPHAGE) 500 MG tablet   Other Relevant Orders   CBC with Differential/Platelet   Comprehensive metabolic panel   Hemoglobin A1c Low carb/low sugar diet Urine microalbumin   Vitamin D insufficiency       Relevant Orders   VITAMIN D 25 Hydroxy (Vit-D Deficiency, Fractures)  Thrombocytopenia B12/folates Follow up with hematology as directed  Lung cancer screening CT ordered     .  No orders of the defined types were placed in this encounter.   Orders Placed This Encounter  Procedures   CBC with Differential/Platelet   Comprehensive metabolic panel   TSH   Lipid panel   Hemoglobin A1c   VITAMIN D 25 Hydroxy (Vit-D Deficiency, Fractures)     Follow-up: Return in about 4 months (around 10/29/2022) for chronic fasting follow-up.  An After Visit Summary was printed and given to the patient.  Jettie Pagan [facility] [phone]

## 2022-06-30 LAB — CBC WITH DIFFERENTIAL/PLATELET
Basophils Absolute: 0.1 10*3/uL (ref 0.0–0.2)
Basos: 1 %
EOS (ABSOLUTE): 0.5 10*3/uL — ABNORMAL HIGH (ref 0.0–0.4)
Eos: 5 %
Hematocrit: 35.9 % (ref 34.0–46.6)
Hemoglobin: 12.2 g/dL (ref 11.1–15.9)
Immature Grans (Abs): 0.1 10*3/uL (ref 0.0–0.1)
Immature Granulocytes: 1 %
Lymphocytes Absolute: 2.7 10*3/uL (ref 0.7–3.1)
Lymphs: 24 %
MCH: 29.5 pg (ref 26.6–33.0)
MCHC: 34 g/dL (ref 31.5–35.7)
MCV: 87 fL (ref 79–97)
Monocytes Absolute: 1.1 10*3/uL — ABNORMAL HIGH (ref 0.1–0.9)
Monocytes: 10 %
Neutrophils Absolute: 6.5 10*3/uL (ref 1.4–7.0)
Neutrophils: 59 %
Platelets: 153 10*3/uL (ref 150–450)
RBC: 4.14 x10E6/uL (ref 3.77–5.28)
RDW: 12.5 % (ref 11.7–15.4)
WBC: 11 10*3/uL — ABNORMAL HIGH (ref 3.4–10.8)

## 2022-06-30 LAB — COMPREHENSIVE METABOLIC PANEL
ALT: 18 IU/L (ref 0–32)
AST: 20 IU/L (ref 0–40)
Albumin/Globulin Ratio: 1.4 (ref 1.2–2.2)
Albumin: 4.2 g/dL (ref 3.9–4.9)
Alkaline Phosphatase: 85 IU/L (ref 44–121)
BUN/Creatinine Ratio: 23 (ref 12–28)
BUN: 18 mg/dL (ref 8–27)
Bilirubin Total: 0.3 mg/dL (ref 0.0–1.2)
CO2: 24 mmol/L (ref 20–29)
Calcium: 9.7 mg/dL (ref 8.7–10.3)
Chloride: 95 mmol/L — ABNORMAL LOW (ref 96–106)
Creatinine, Ser: 0.8 mg/dL (ref 0.57–1.00)
Globulin, Total: 2.9 g/dL (ref 1.5–4.5)
Glucose: 97 mg/dL (ref 70–99)
Potassium: 4.7 mmol/L (ref 3.5–5.2)
Sodium: 134 mmol/L (ref 134–144)
Total Protein: 7.1 g/dL (ref 6.0–8.5)
eGFR: 80 mL/min/{1.73_m2} (ref >59)

## 2022-06-30 LAB — LIPID PANEL
Chol/HDL Ratio: 3.1 ratio (ref 0.0–4.4)
Cholesterol, Total: 148 mg/dL (ref 100–199)
HDL: 47 mg/dL (ref >39)
LDL Chol Calc (NIH): 74 mg/dL (ref 0–99)
Triglycerides: 155 mg/dL — ABNORMAL HIGH (ref 0–149)
VLDL Cholesterol Cal: 27 mg/dL (ref 5–40)

## 2022-06-30 LAB — HEMOGLOBIN A1C
Est. average glucose Bld gHb Est-mCnc: 123 mg/dL
Hgb A1c MFr Bld: 5.9 % — ABNORMAL HIGH (ref 4.8–5.6)

## 2022-06-30 LAB — B12 AND FOLATE PANEL
Folate: 7.8 ng/mL (ref >3.0)
Vitamin B-12: 324 pg/mL (ref 232–1245)

## 2022-06-30 LAB — TSH: TSH: 2.72 u[IU]/mL (ref 0.450–4.500)

## 2022-06-30 LAB — VITAMIN D 25 HYDROXY (VIT D DEFICIENCY, FRACTURES): Vit D, 25-Hydroxy: 52.6 ng/mL (ref 30.0–100.0)

## 2022-07-01 LAB — MICROALBUMIN / CREATININE URINE RATIO
Creatinine, Urine: 77.3 mg/dL
Microalb/Creat Ratio: 17 mg/g creat (ref 0–29)
Microalbumin, Urine: 13.4 ug/mL

## 2022-07-07 ENCOUNTER — Other Ambulatory Visit: Payer: Self-pay | Admitting: Physician Assistant

## 2022-07-07 DIAGNOSIS — F330 Major depressive disorder, recurrent, mild: Secondary | ICD-10-CM

## 2022-07-16 DIAGNOSIS — Z122 Encounter for screening for malignant neoplasm of respiratory organs: Secondary | ICD-10-CM | POA: Diagnosis not present

## 2022-07-16 DIAGNOSIS — F17210 Nicotine dependence, cigarettes, uncomplicated: Secondary | ICD-10-CM | POA: Diagnosis not present

## 2022-07-16 DIAGNOSIS — Z87891 Personal history of nicotine dependence: Secondary | ICD-10-CM | POA: Diagnosis not present

## 2022-07-19 ENCOUNTER — Encounter: Payer: Self-pay | Admitting: Physician Assistant

## 2022-07-23 ENCOUNTER — Ambulatory Visit: Payer: PPO | Admitting: Physician Assistant

## 2022-08-15 NOTE — Progress Notes (Unsigned)
[facility]  [address] [place],  [place]  [address] [phone]  Clinic Day:  08/21/2022  Referring physician: Marianne Sofia, PA-C   HISTORY OF PRESENT ILLNESS:  The patient is a 67 y.o. female with chronic leukocytosis felt to be due to tobacco use.  She also had mild anemia and thrombocytopenia, but the thrombocytopenia had resolved at the time of consultation in April.  Her B12 was low normal, we recommended oral B12 supplement with resolution of her anemia.  She is here today for repeat clinical assessment.  She states she did not start B12 and when she saw Ms. Earlene Plater on June 7 her B12 was normal.  Review of her records revealed white count improved to 11, hemoglobin improved to 12.2 with an MCV of 87 and B12 of 324.  She denies abnormal bruising or bleeding.  She denies recent infection or steroid use.  She denies progressive fatigue concerning for worsening anemia.  She is up-to-date on screening mammogram, colonoscopy and CT chest.  PHYSICAL EXAM:  Blood pressure 123/62, pulse (!) 57, temperature 98.1 F (36.7 C), temperature source Oral, resp. rate 20, height 5\' 4"  (1.626 m), weight 160 lb 14.4 oz (73 kg), SpO2 98%. Wt Readings from Last 3 Encounters:  08/21/22 160 lb 14.4 oz (73 kg)  06/29/22 159 lb (72.1 kg)  05/21/22 161 lb 9.6 oz (73.3 kg)   Body mass index is 27.62 kg/m.  Performance status (ECOG): 0 - Asymptomatic  Physical Exam Vitals and nursing note reviewed.  Constitutional:      General: She is not in acute distress.    Appearance: Normal appearance.  HENT:     Head: Normocephalic and atraumatic.     Mouth/Throat:     Mouth: Mucous membranes are moist.     Pharynx: Oropharynx is clear. No oropharyngeal exudate or posterior oropharyngeal erythema.  Eyes:     General: No scleral icterus.    Extraocular Movements: Extraocular movements intact.     Conjunctiva/sclera: Conjunctivae normal.     Pupils: Pupils are equal, round,  and reactive to light.  Cardiovascular:     Rate and Rhythm: Regular rhythm. Bradycardia present.     Heart sounds: Normal heart sounds. No murmur heard.    No friction rub. No gallop.  Pulmonary:     Effort: Pulmonary effort is normal.     Breath sounds: Normal breath sounds. No wheezing, rhonchi or rales.  Abdominal:     General: There is no distension.     Palpations: Abdomen is soft. There is no hepatomegaly, splenomegaly or mass.     Tenderness: There is no abdominal tenderness.  Musculoskeletal:        General: Normal range of motion.     Cervical back: Normal range of motion and neck supple. No tenderness.     Right lower leg: No edema.     Left lower leg: No edema.  Lymphadenopathy:     Cervical: No cervical adenopathy.     Upper Body:     Right upper body: No supraclavicular or axillary adenopathy.     Left upper body: No supraclavicular or axillary adenopathy.     Lower Body: No right inguinal adenopathy. No left inguinal adenopathy.  Skin:    General: Skin is warm and dry.     Coloration: Skin is not jaundiced.     Findings: No rash.  Neurological:     Mental Status: She is alert and oriented to person, place, and  time.     Cranial Nerves: No cranial nerve deficit.  Psychiatric:        Mood and Affect: Mood normal.        Behavior: Behavior normal.        Thought Content: Thought content normal.   LABS:      Latest Ref Rng & Units 06/29/2022   10:39 AM 05/21/2022    4:09 PM 05/16/2022    2:51 PM  CBC  WBC 3.4 - 10.8 x10E3/uL 11.0  14.3  15.3   Hemoglobin 11.1 - 15.9 g/dL 16.1  09.6  04.5   Hematocrit 34.0 - 46.6 % 35.9  34.3  34.1   Platelets 150 - 450 x10E3/uL 153  171  120       Latest Ref Rng & Units 06/29/2022   10:39 AM 05/21/2022    4:09 PM 05/16/2022    2:51 PM  CMP  Glucose 70 - 99 mg/dL 97  98  81   BUN 8 - 27 mg/dL 18  12  10    Creatinine 0.57 - 1.00 mg/dL 4.09  8.11  9.14   Sodium 134 - 144 mmol/L 134  130  132   Potassium 3.5 - 5.2 mmol/L 4.7   3.9  4.4   Chloride 96 - 106 mmol/L 95  92  92   CO2 20 - 29 mmol/L 24  26  22    Calcium 8.7 - 10.3 mg/dL 9.7  8.9  9.1   Total Protein 6.0 - 8.5 g/dL 7.1  6.9  7.1   Total Bilirubin 0.0 - 1.2 mg/dL 0.3  0.4  0.3   Alkaline Phos 44 - 121 IU/L 85  65  96   AST 0 - 40 IU/L 20  17  18    ALT 0 - 32 IU/L 18  17  17       No results found for: "CEA1", "CEA" / No results found for: "CEA1", "CEA" No results found for: "PSA1" No results found for: "NWG956" No results found for: "CAN125"  No results found for: "TOTALPROTELP", "ALBUMINELP", "A1GS", "A2GS", "BETS", "BETA2SER", "GAMS", "MSPIKE", "SPEI" No results found for: "TIBC", "FERRITIN", "IRONPCTSAT" No results found for: "LDH"  No results found for: "AFPTUMOR", "TOTALPROTELP", "ALBUMINELP", "A1GS", "A2GS", "BETS", "BETA2SER", "GAMS", "MSPIKE", "SPEI", "LDH", "CEA1", "CEA", "PSA1", "IGASERUM", "IGGSERUM", "IGMSERUM", "THGAB", "THYROGLB"  Review Flowsheet        No data to display           STUDIES:  No results found.    ASSESSMENT & PLAN:   Assessment/Plan:  67 y.o. female with chronic leukocytosis felt to be due to tobacco use.  She also had mild anemia and thrombocytopenia.  Her anemia resolved despite not taking B12.  As her white count remains stable, anemia and thrombocytopenia have resolved, we will release her care back to her primary care provider.  We are glad to see the patient back in the future as needed.  The patient understands all the plans discussed today and is in agreement with them.    Adah Perl, PA-C   Physician Assistant [facility] [place] [phone]

## 2022-08-18 ENCOUNTER — Other Ambulatory Visit: Payer: Self-pay | Admitting: Physician Assistant

## 2022-08-21 ENCOUNTER — Encounter: Payer: Self-pay | Admitting: Hematology and Oncology

## 2022-08-21 ENCOUNTER — Inpatient Hospital Stay: Payer: PPO | Attending: Hematology and Oncology | Admitting: Hematology and Oncology

## 2022-08-21 ENCOUNTER — Inpatient Hospital Stay: Payer: PPO

## 2022-08-21 VITALS — BP 123/62 | HR 57 | Temp 98.1°F | Resp 20 | Ht 64.0 in | Wt 160.9 lb

## 2022-08-21 DIAGNOSIS — D696 Thrombocytopenia, unspecified: Secondary | ICD-10-CM

## 2022-08-21 DIAGNOSIS — E538 Deficiency of other specified B group vitamins: Secondary | ICD-10-CM | POA: Insufficient documentation

## 2022-08-21 DIAGNOSIS — D72829 Elevated white blood cell count, unspecified: Secondary | ICD-10-CM | POA: Diagnosis not present

## 2022-08-21 DIAGNOSIS — D649 Anemia, unspecified: Secondary | ICD-10-CM | POA: Insufficient documentation

## 2022-08-21 LAB — CBC WITH DIFFERENTIAL (CANCER CENTER ONLY)
Abs Immature Granulocytes: 0.07 10*3/uL (ref 0.00–0.07)
Basophils Absolute: 0.1 10*3/uL (ref 0.0–0.1)
Basophils Relative: 1 %
Eosinophils Absolute: 0.6 10*3/uL — ABNORMAL HIGH (ref 0.0–0.5)
Eosinophils Relative: 5 %
HCT: 36.8 % (ref 36.0–46.0)
Hemoglobin: 11.9 g/dL — ABNORMAL LOW (ref 12.0–15.0)
Immature Granulocytes: 1 %
Lymphocytes Relative: 23 %
Lymphs Abs: 2.8 10*3/uL (ref 0.7–4.0)
MCH: 29.1 pg (ref 26.0–34.0)
MCHC: 32.3 g/dL (ref 30.0–36.0)
MCV: 90 fL (ref 80.0–100.0)
Monocytes Absolute: 1.3 10*3/uL — ABNORMAL HIGH (ref 0.1–1.0)
Monocytes Relative: 10 %
Neutro Abs: 7.5 10*3/uL (ref 1.7–7.7)
Neutrophils Relative %: 60 %
Platelet Count: 134 10*3/uL — ABNORMAL LOW (ref 150–400)
RBC: 4.09 MIL/uL (ref 3.87–5.11)
RDW: 12.9 % (ref 11.5–15.5)
WBC Count: 12.4 10*3/uL — ABNORMAL HIGH (ref 4.0–10.5)
nRBC: 0 % (ref 0.0–0.2)

## 2022-08-21 LAB — CMP (CANCER CENTER ONLY)
ALT: 20 U/L (ref 0–44)
AST: 18 U/L (ref 15–41)
Albumin: 3.7 g/dL (ref 3.5–5.0)
Alkaline Phosphatase: 67 U/L (ref 38–126)
Anion gap: 11 (ref 5–15)
BUN: 17 mg/dL (ref 8–23)
CO2: 27 mmol/L (ref 22–32)
Calcium: 9.1 mg/dL (ref 8.9–10.3)
Chloride: 95 mmol/L — ABNORMAL LOW (ref 98–111)
Creatinine: 0.93 mg/dL (ref 0.44–1.00)
GFR, Estimated: >60 mL/min (ref >60)
Glucose, Bld: 111 mg/dL — ABNORMAL HIGH (ref 70–99)
Potassium: 3.7 mmol/L (ref 3.5–5.1)
Sodium: 133 mmol/L — ABNORMAL LOW (ref 135–145)
Total Bilirubin: 0.3 mg/dL (ref 0.3–1.2)
Total Protein: 7.2 g/dL (ref 6.5–8.1)

## 2022-08-21 LAB — VITAMIN B12: Vitamin B-12: 171 pg/mL — ABNORMAL LOW (ref 180–914)

## 2022-08-22 ENCOUNTER — Telehealth: Payer: Self-pay | Admitting: Hematology and Oncology

## 2022-08-22 ENCOUNTER — Other Ambulatory Visit: Payer: Self-pay | Admitting: Hematology and Oncology

## 2022-08-22 ENCOUNTER — Encounter: Payer: Self-pay | Admitting: Hematology and Oncology

## 2022-08-22 DIAGNOSIS — D696 Thrombocytopenia, unspecified: Secondary | ICD-10-CM

## 2022-08-22 NOTE — Telephone Encounter (Signed)
Left message for patient. B12 is low. Usually recommend B12 injections at this level. Asked her to call back to schedule if she was willing, otherwise start oral B12 1000 mcg daily. I would not recommend stopping the B12 supplement. I had not planned for follow up based on labs at her PCP last week, but given this, I would recommend I check her again. If she starts injections, will definitely need f/u

## 2022-08-23 ENCOUNTER — Telehealth: Payer: Self-pay | Admitting: Hematology and Oncology

## 2022-08-23 NOTE — Telephone Encounter (Signed)
08/23/22 Left VM-3MTH F/U APPT in OCT.2024

## 2022-08-31 DIAGNOSIS — H2513 Age-related nuclear cataract, bilateral: Secondary | ICD-10-CM | POA: Diagnosis not present

## 2022-08-31 DIAGNOSIS — H353131 Nonexudative age-related macular degeneration, bilateral, early dry stage: Secondary | ICD-10-CM | POA: Diagnosis not present

## 2022-08-31 DIAGNOSIS — H18413 Arcus senilis, bilateral: Secondary | ICD-10-CM | POA: Diagnosis not present

## 2022-08-31 DIAGNOSIS — H25013 Cortical age-related cataract, bilateral: Secondary | ICD-10-CM | POA: Diagnosis not present

## 2022-08-31 DIAGNOSIS — H2512 Age-related nuclear cataract, left eye: Secondary | ICD-10-CM | POA: Diagnosis not present

## 2022-08-31 DIAGNOSIS — H25043 Posterior subcapsular polar age-related cataract, bilateral: Secondary | ICD-10-CM | POA: Diagnosis not present

## 2022-09-03 ENCOUNTER — Other Ambulatory Visit: Payer: Self-pay | Admitting: Physician Assistant

## 2022-09-03 DIAGNOSIS — F330 Major depressive disorder, recurrent, mild: Secondary | ICD-10-CM

## 2022-09-11 ENCOUNTER — Other Ambulatory Visit: Payer: Self-pay | Admitting: Physician Assistant

## 2022-09-15 ENCOUNTER — Other Ambulatory Visit: Payer: Self-pay | Admitting: Family Medicine

## 2022-09-15 DIAGNOSIS — F330 Major depressive disorder, recurrent, mild: Secondary | ICD-10-CM

## 2022-09-18 DIAGNOSIS — H35033 Hypertensive retinopathy, bilateral: Secondary | ICD-10-CM | POA: Diagnosis not present

## 2022-09-18 DIAGNOSIS — H35712 Central serous chorioretinopathy, left eye: Secondary | ICD-10-CM | POA: Diagnosis not present

## 2022-09-18 DIAGNOSIS — H2513 Age-related nuclear cataract, bilateral: Secondary | ICD-10-CM | POA: Diagnosis not present

## 2022-09-18 DIAGNOSIS — H353132 Nonexudative age-related macular degeneration, bilateral, intermediate dry stage: Secondary | ICD-10-CM | POA: Diagnosis not present

## 2022-09-18 DIAGNOSIS — H35371 Puckering of macula, right eye: Secondary | ICD-10-CM | POA: Diagnosis not present

## 2022-09-18 DIAGNOSIS — E119 Type 2 diabetes mellitus without complications: Secondary | ICD-10-CM | POA: Diagnosis not present

## 2022-10-11 DIAGNOSIS — Z85828 Personal history of other malignant neoplasm of skin: Secondary | ICD-10-CM | POA: Diagnosis not present

## 2022-10-11 DIAGNOSIS — L2089 Other atopic dermatitis: Secondary | ICD-10-CM | POA: Diagnosis not present

## 2022-10-15 DIAGNOSIS — H2511 Age-related nuclear cataract, right eye: Secondary | ICD-10-CM | POA: Diagnosis not present

## 2022-10-15 DIAGNOSIS — H25013 Cortical age-related cataract, bilateral: Secondary | ICD-10-CM | POA: Diagnosis not present

## 2022-10-15 DIAGNOSIS — H25043 Posterior subcapsular polar age-related cataract, bilateral: Secondary | ICD-10-CM | POA: Diagnosis not present

## 2022-10-15 HISTORY — PX: EYE SURGERY: SHX253

## 2022-10-16 ENCOUNTER — Telehealth: Payer: Self-pay

## 2022-10-16 DIAGNOSIS — H2512 Age-related nuclear cataract, left eye: Secondary | ICD-10-CM | POA: Diagnosis not present

## 2022-10-16 DIAGNOSIS — H25012 Cortical age-related cataract, left eye: Secondary | ICD-10-CM | POA: Diagnosis not present

## 2022-10-16 DIAGNOSIS — H25042 Posterior subcapsular polar age-related cataract, left eye: Secondary | ICD-10-CM | POA: Diagnosis not present

## 2022-10-16 NOTE — Telephone Encounter (Signed)
I left a message on the number(s) listed in the patients chart requesting the patient to call back regarding the upcomming appointment for 11/08/2022. The provider is out of the office that day. The appointment has been canceled. Waiting for the patient to return the call.  NOTE: If the patient does not call back within a week to reschedule this appointment, the front staff will mail the patient a letter requesting to call the office back.

## 2022-10-18 ENCOUNTER — Other Ambulatory Visit: Payer: Self-pay | Admitting: Physician Assistant

## 2022-10-22 DIAGNOSIS — H3581 Retinal edema: Secondary | ICD-10-CM | POA: Diagnosis not present

## 2022-10-22 DIAGNOSIS — H52221 Regular astigmatism, right eye: Secondary | ICD-10-CM | POA: Diagnosis not present

## 2022-10-22 DIAGNOSIS — H2512 Age-related nuclear cataract, left eye: Secondary | ICD-10-CM | POA: Diagnosis not present

## 2022-10-22 DIAGNOSIS — H2511 Age-related nuclear cataract, right eye: Secondary | ICD-10-CM | POA: Diagnosis not present

## 2022-10-22 DIAGNOSIS — H25042 Posterior subcapsular polar age-related cataract, left eye: Secondary | ICD-10-CM | POA: Diagnosis not present

## 2022-10-22 DIAGNOSIS — H5201 Hypermetropia, right eye: Secondary | ICD-10-CM | POA: Diagnosis not present

## 2022-10-22 DIAGNOSIS — H40053 Ocular hypertension, bilateral: Secondary | ICD-10-CM | POA: Diagnosis not present

## 2022-10-23 DIAGNOSIS — H353132 Nonexudative age-related macular degeneration, bilateral, intermediate dry stage: Secondary | ICD-10-CM | POA: Diagnosis not present

## 2022-10-23 DIAGNOSIS — H2513 Age-related nuclear cataract, bilateral: Secondary | ICD-10-CM | POA: Diagnosis not present

## 2022-10-23 DIAGNOSIS — H35712 Central serous chorioretinopathy, left eye: Secondary | ICD-10-CM | POA: Diagnosis not present

## 2022-10-23 DIAGNOSIS — E119 Type 2 diabetes mellitus without complications: Secondary | ICD-10-CM | POA: Diagnosis not present

## 2022-10-23 DIAGNOSIS — Z961 Presence of intraocular lens: Secondary | ICD-10-CM | POA: Diagnosis not present

## 2022-10-23 DIAGNOSIS — H35033 Hypertensive retinopathy, bilateral: Secondary | ICD-10-CM | POA: Diagnosis not present

## 2022-10-23 DIAGNOSIS — H35371 Puckering of macula, right eye: Secondary | ICD-10-CM | POA: Diagnosis not present

## 2022-10-26 ENCOUNTER — Other Ambulatory Visit: Payer: Self-pay | Admitting: Physician Assistant

## 2022-11-01 ENCOUNTER — Other Ambulatory Visit: Payer: Self-pay | Admitting: Family Medicine

## 2022-11-01 DIAGNOSIS — F330 Major depressive disorder, recurrent, mild: Secondary | ICD-10-CM

## 2022-11-04 ENCOUNTER — Other Ambulatory Visit: Payer: Self-pay | Admitting: Physician Assistant

## 2022-11-08 ENCOUNTER — Ambulatory Visit: Payer: PPO | Admitting: Physician Assistant

## 2022-11-13 DIAGNOSIS — H2513 Age-related nuclear cataract, bilateral: Secondary | ICD-10-CM | POA: Diagnosis not present

## 2022-11-13 DIAGNOSIS — H35712 Central serous chorioretinopathy, left eye: Secondary | ICD-10-CM | POA: Diagnosis not present

## 2022-11-13 DIAGNOSIS — H353132 Nonexudative age-related macular degeneration, bilateral, intermediate dry stage: Secondary | ICD-10-CM | POA: Diagnosis not present

## 2022-11-13 DIAGNOSIS — E119 Type 2 diabetes mellitus without complications: Secondary | ICD-10-CM | POA: Diagnosis not present

## 2022-11-13 DIAGNOSIS — H35371 Puckering of macula, right eye: Secondary | ICD-10-CM | POA: Diagnosis not present

## 2022-11-13 DIAGNOSIS — H35033 Hypertensive retinopathy, bilateral: Secondary | ICD-10-CM | POA: Diagnosis not present

## 2022-11-13 DIAGNOSIS — Z961 Presence of intraocular lens: Secondary | ICD-10-CM | POA: Diagnosis not present

## 2022-11-13 DIAGNOSIS — H43823 Vitreomacular adhesion, bilateral: Secondary | ICD-10-CM | POA: Diagnosis not present

## 2022-11-19 ENCOUNTER — Encounter: Payer: Self-pay | Admitting: Physician Assistant

## 2022-11-19 ENCOUNTER — Ambulatory Visit (INDEPENDENT_AMBULATORY_CARE_PROVIDER_SITE_OTHER): Payer: PPO | Admitting: Physician Assistant

## 2022-11-19 VITALS — BP 120/66 | HR 68 | Temp 97.2°F | Ht 64.0 in | Wt 158.0 lb

## 2022-11-19 DIAGNOSIS — F330 Major depressive disorder, recurrent, mild: Secondary | ICD-10-CM | POA: Diagnosis not present

## 2022-11-19 DIAGNOSIS — E1159 Type 2 diabetes mellitus with other circulatory complications: Secondary | ICD-10-CM | POA: Diagnosis not present

## 2022-11-19 DIAGNOSIS — E785 Hyperlipidemia, unspecified: Secondary | ICD-10-CM | POA: Insufficient documentation

## 2022-11-19 DIAGNOSIS — Z720 Tobacco use: Secondary | ICD-10-CM

## 2022-11-19 DIAGNOSIS — E039 Hypothyroidism, unspecified: Secondary | ICD-10-CM

## 2022-11-19 DIAGNOSIS — Z23 Encounter for immunization: Secondary | ICD-10-CM

## 2022-11-19 DIAGNOSIS — D696 Thrombocytopenia, unspecified: Secondary | ICD-10-CM

## 2022-11-19 DIAGNOSIS — E1169 Type 2 diabetes mellitus with other specified complication: Secondary | ICD-10-CM

## 2022-11-19 DIAGNOSIS — E1165 Type 2 diabetes mellitus with hyperglycemia: Secondary | ICD-10-CM

## 2022-11-19 DIAGNOSIS — I2510 Atherosclerotic heart disease of native coronary artery without angina pectoris: Secondary | ICD-10-CM

## 2022-11-19 DIAGNOSIS — E538 Deficiency of other specified B group vitamins: Secondary | ICD-10-CM

## 2022-11-19 DIAGNOSIS — I700 Atherosclerosis of aorta: Secondary | ICD-10-CM

## 2022-11-19 DIAGNOSIS — E559 Vitamin D deficiency, unspecified: Secondary | ICD-10-CM | POA: Diagnosis not present

## 2022-11-19 DIAGNOSIS — I10 Essential (primary) hypertension: Secondary | ICD-10-CM | POA: Diagnosis not present

## 2022-11-19 HISTORY — DX: Type 2 diabetes mellitus with other specified complication: E11.69

## 2022-11-19 HISTORY — DX: Thrombocytopenia, unspecified: D69.6

## 2022-11-19 HISTORY — DX: Atherosclerosis of aorta: I70.0

## 2022-11-19 HISTORY — DX: Encounter for immunization: Z23

## 2022-11-19 HISTORY — DX: Atherosclerotic heart disease of native coronary artery without angina pectoris: I25.10

## 2022-11-19 NOTE — Progress Notes (Signed)
Subjective:  Patient ID: Cassandra Fox, female    DOB: 1955-05-11  Age: 67 y.o. MRN: [id number]  Chief Complaint  Patient presents with   Medical Management of Chronic Issues    Hyperlipidemia  Hypertension  Diabetes    Pt presents for follow up of hypertension. The patient is tolerating the medication well without side effects. Compliance with treatment has been good; including taking medication as directed , maintains a healthy diet and regular exercise regimen , and following up as directed. Pt currently taking amlodpine 10mg , hyzaar 100/25 and metoprolol 25mg    Pt with history of hypothyroidism - currently on synthroid  Pt with history of NIDDM - currently on glucophage 500mg  bid  -sugars have been in good control ranging under 120 fasting She is up to date with eye exam - voices no problems or concerns - will sign for records  Pt with history of depression with anxiety - currently taking celexa 40mg  qd and xanax as needed -   Mixed hyperlipidemia  Pt presents with hyperlipidemia.  Compliance with treatment has been good The patient is compliant with medications, maintains a low cholesterol diet , follows up as directed , and maintains an exercise regimen . The patient denies experiencing any hypercholesterolemia related symptoms. Currently on zocor 20mg  qd Per chest CT she was noted to have aortic atherosclerosis and coronary artery calcification - she did not call back for cardiology referral but does want to be referred to Dr Dulce Sellar  Pt takes otc vit D supplements - due for labwork  Pt with neuralgias and states gabapentin works well for her  Pt with history of chronic leukocytosis and mild thrombocytopenia and currently follows with hematology - has appt this week She also states she was started on daily Vit B12 supplement - due for labwork  Pt would like pneumonia shot today Current Outpatient Medications on File Prior to Visit  Medication Sig Dispense Refill    ALPRAZolam (XANAX) 0.5 MG tablet TAKE 1 TABLET(0.5 MG) BY MOUTH AT BEDTIME AS NEEDED FOR ANXIETY 30 tablet 0   amLODipine (NORVASC) 10 MG tablet TAKE 1 TABLET(10 MG) BY MOUTH DAILY 90 tablet 0   aspirin EC 81 MG tablet Take 81 mg by mouth daily.     citalopram (CELEXA) 40 MG tablet TAKE 1 TABLET BY MOUTH EVERY DAY 30 tablet 3   cyanocobalamin (VITAMIN B12) 1000 MCG tablet Take 1,000 mcg by mouth daily.     diphenhydrAMINE (BENADRYL) 50 MG capsule Take 50 mg by mouth at bedtime.     fluticasone (FLONASE) 50 MCG/ACT nasal spray SHAKE LIQUID AND USE 2 SPRAYS IN EACH NOSTRIL DAILY 16 g 6   gabapentin (NEURONTIN) 300 MG capsule TAKE 1 CAPSULE(300 MG) BY MOUTH AT BEDTIME 30 capsule 1   glucose blood (ONETOUCH VERIO) test strip USE TO CHECK BLOOD GLUCOSE TWICE DAILY 100 strip 4   levothyroxine (SYNTHROID) 75 MCG tablet TAKE 1 TABLET(75 MCG) BY MOUTH DAILY BEFORE BREAKFAST 90 tablet 1   losartan-hydrochlorothiazide (HYZAAR) 100-25 MG tablet TAKE 1 TABLET BY MOUTH DAILY 90 tablet 2   metFORMIN (GLUCOPHAGE) 500 MG tablet Take 1 tablet (500 mg total) by mouth 2 (two) times daily. 180 tablet 2   metoprolol tartrate (LOPRESSOR) 25 MG tablet TAKE 1 TABLET(25 MG) BY MOUTH DAILY 90 tablet 1   Multiple Vitamins-Minerals (OCUVITE PRESERVISION PO) Take by mouth.     simvastatin (ZOCOR) 20 MG tablet TAKE 1 TABLET(20 MG) BY MOUTH AT BEDTIME 90 tablet 1  Vitamin D, Cholecalciferol, 1000 units CAPS Take by mouth.     Zinc Sulfate (ZINC 15 PO) Take by mouth.     No current facility-administered medications on file prior to visit.   Past Medical History:  Diagnosis Date   Anxiety    Breast mass, right    Depression    Diabetes mellitus without complication (HCC)    Diverticulitis    Hyperlipidemia    Hypertension    Hypothyroidism    Neuropathy    Vitamin D deficiency    Past Surgical History:  Procedure Laterality Date   BREAST EXCISIONAL BIOPSY Right    benign   BREAST LUMPECTOMY WITH RADIOACTIVE  SEED LOCALIZATION Right 09/23/2015   Procedure: RIGHT BREAST LUMPECTOMY WITH RADIOACTIVE SEED LOCALIZATION;  Surgeon: Glenna Fellows, MD;  Location: [facility];  Service: General;  Laterality: Right;   CHOLECYSTECTOMY     EYE SURGERY Left 10/15/2022    Family History  Problem Relation Age of Onset   Hypertension Mother    Hyperlipidemia Mother    Diabetes Mother    Colon cancer Mother    Hyperlipidemia Father    Hypertension Father    Stroke Sister    Social History   Socioeconomic History   Marital status: Married    Spouse name: Homero Fellers   Number of children: 3   Years of education: Not on file   Highest education level: Associate degree: occupational, Scientist, product/process development, or vocational program  Occupational History   Occupation: Retired Theme park manager  Tobacco Use   Smoking status: Every Day    Current packs/day: 0.50    Average packs/day: 0.5 packs/day for 40.0 years (20.0 ttl pk-yrs)    Types: Cigarettes   Smokeless tobacco: Never  Vaping Use   Vaping status: Never Used  Substance and Sexual Activity   Alcohol use: Never    Comment: social   Drug use: Never   Sexual activity: Not Currently  Other Topics Concern   Not on file  Social History Narrative   Not on file   Social Determinants of Health   Financial Resource Strain: Low Risk  (11/19/2022)   Overall Financial Resource Strain (CARDIA)    Difficulty of Paying Living Expenses: Not hard at all  Food Insecurity: No Food Insecurity (11/19/2022)   Hunger Vital Sign    Worried About Running Out of Food in the Last Year: Never true    Ran Out of Food in the Last Year: Never true  Transportation Needs: No Transportation Needs (11/19/2022)   PRAPARE - Administrator, Civil Service (Medical): No    Lack of Transportation (Non-Medical): No  Physical Activity: Unknown (11/19/2022)   Exercise Vital Sign    Days of Exercise per Week: Patient declined    Minutes of Exercise per Session: 30 min   Stress: Stress Concern Present (11/19/2022)   [employer] of Occupational Health - Occupational Stress Questionnaire    Feeling of Stress : Rather much  Social Connections: Unknown (11/19/2022)   Social Connection and Isolation Panel [NHANES]    Frequency of Communication with Friends and Family: More than three times a week    Frequency of Social Gatherings with Friends and Family: Once a week    Attends Religious Services: Patient declined    Database administrator or Organizations: No    Attends Engineer, structural: Never    Marital Status: Married   CONSTITUTIONAL: Negative for chills, fatigue, fever, unintentional weight gain and unintentional weight loss.  E/N/T: Negative for ear pain, nasal congestion and sore throat.  CARDIOVASCULAR: Negative for chest pain, dizziness, palpitations and pedal edema.  RESPIRATORY: Negative for recent cough and dyspnea.  GASTROINTESTINAL: Negative for abdominal pain, acid reflux symptoms, constipation, diarrhea, nausea and vomiting.  MSK: Negative for arthralgias and myalgias.  INTEGUMENTARY: Negative for rash.  NEUROLOGICAL: Negative for dizziness and headaches.  PSYCHIATRIC: Negative for sleep disturbance and to question depression screen.  Negative for depression, negative for anhedonia.       Objective:  PHYSICAL EXAM:   VS: BP 120/66 (BP Location: Left Arm, Patient Position: Sitting, Cuff Size: Large)   Pulse 68   Temp (!) 97.2 F (36.2 C) (Temporal)   Ht 5\' 4"  (1.626 m)   Wt 158 lb (71.7 kg)   SpO2 95%   BMI 27.12 kg/m   GEN: Well nourished, well developed, in no acute distress   Cardiac: RRR; no murmurs, rubs, or gallops,no edema -  Respiratory:  normal respiratory rate and pattern with no distress - normal breath sounds with no rales, rhonchi, wheezes or rubs  MS: no deformity or atrophy  Skin: warm and dry, no rash  Neuro:  Alert and Oriented x 3,  - CN II-Xii grossly intact Psych: euthymic mood,  appropriate affect and demeanor   Lab Results  Component Value Date   WBC 12.4 (H) 08/21/2022   HGB 11.9 (L) 08/21/2022   HCT 36.8 08/21/2022   PLT 134 (L) 08/21/2022   GLUCOSE 111 (H) 08/21/2022   CHOL 148 06/29/2022   TRIG 155 (H) 06/29/2022   HDL 47 06/29/2022   LDLCALC 74 06/29/2022   ALT 20 08/21/2022   AST 18 08/21/2022   NA 133 (L) 08/21/2022   K 3.7 08/21/2022   CL 95 (L) 08/21/2022   CREATININE 0.93 08/21/2022   BUN 17 08/21/2022   CO2 27 08/21/2022   TSH 2.720 06/29/2022   HGBA1C 5.9 (H) 06/29/2022   MICROALBUR 30 03/19/2019      Assessment & Plan:   Problem List Items Addressed This Visit       Cardiovascular and Mediastinum   Hypertension associated with diabetes (HCC)   Relevant Orders   CBC with Differential/Platelet   Comprehensive metabolic panel Continue meds     Endocrine   Hypothyroidism, adult   Relevant Orders   TSH Continue med     Other   Hyperlipidemia associated with diabetes (HCC)   Relevant Orders   Lipid panel Watch diet - low fat/low chol Continue med   Major depressive disorder, recurrent episode, mild (HCC) Continue meds   Other Visit Diagnoses     Type 2 diabetes mellitus with hyperglycemia (HCC)       Relevant Medications   metFORMIN (GLUCOPHAGE) 500 MG tablet   Other Relevant Orders   CBC with Differential/Platelet   Comprehensive metabolic panel   Hemoglobin A1c Low carb/low sugar diet    Vitamin D insufficiency       Relevant Orders   VITAMIN D 25 Hydroxy (Vit-D Deficiency, Fractures)  Thrombocytopenia Cbc pending Follow up with hematology as directed  B12 def Continue supplement Labwork pending  Need for pneumonia vaccine Pneumo 20 given  Aortic atherosclerosis (HCC) Coronary artery calcification Continue zocor Recommend asa qd Referral to cardiology     .  No orders of the defined types were placed in this encounter.   Orders Placed This Encounter  Procedures   Pneumococcal conjugate  vaccine 20-valent (Prevnar 20)   CBC with Differential/Platelet   Comprehensive  metabolic panel   Lipid panel   Hemoglobin A1c   VITAMIN D 25 Hydroxy (Vit-D Deficiency, Fractures)   TSH   B12 and Folate Panel   Ambulatory referral to Cardiology     Follow-up: Return in about 4 months (around 03/22/2023) for chronic fasting follow-up.  An After Visit Summary was printed and given to the patient.  Jettie Pagan [facility] [phone]

## 2022-11-20 LAB — HEMOGLOBIN A1C
Est. average glucose Bld gHb Est-mCnc: 128 mg/dL
Hgb A1c MFr Bld: 6.1 % — ABNORMAL HIGH (ref 4.8–5.6)

## 2022-11-20 LAB — CBC WITH DIFFERENTIAL/PLATELET
Basophils Absolute: 0.1 10*3/uL (ref 0.0–0.2)
Basos: 1 %
EOS (ABSOLUTE): 0.4 10*3/uL (ref 0.0–0.4)
Eos: 4 %
Hematocrit: 37.1 % (ref 34.0–46.6)
Hemoglobin: 12.2 g/dL (ref 11.1–15.9)
Immature Grans (Abs): 0 10*3/uL (ref 0.0–0.1)
Immature Granulocytes: 0 %
Lymphocytes Absolute: 2.1 10*3/uL (ref 0.7–3.1)
Lymphs: 21 %
MCH: 29.3 pg (ref 26.6–33.0)
MCHC: 32.9 g/dL (ref 31.5–35.7)
MCV: 89 fL (ref 79–97)
Monocytes Absolute: 1 10*3/uL — ABNORMAL HIGH (ref 0.1–0.9)
Monocytes: 10 %
Neutrophils Absolute: 6.2 10*3/uL (ref 1.4–7.0)
Neutrophils: 64 %
Platelets: 175 10*3/uL (ref 150–450)
RBC: 4.17 x10E6/uL (ref 3.77–5.28)
RDW: 12.8 % (ref 11.7–15.4)
WBC: 10 10*3/uL (ref 3.4–10.8)

## 2022-11-20 LAB — COMPREHENSIVE METABOLIC PANEL
ALT: 17 [IU]/L (ref 0–32)
AST: 18 [IU]/L (ref 0–40)
Albumin: 4.2 g/dL (ref 3.9–4.9)
Alkaline Phosphatase: 82 [IU]/L (ref 44–121)
BUN/Creatinine Ratio: 15 (ref 12–28)
BUN: 13 mg/dL (ref 8–27)
Bilirubin Total: 0.4 mg/dL (ref 0.0–1.2)
CO2: 24 mmol/L (ref 20–29)
Calcium: 9.3 mg/dL (ref 8.7–10.3)
Chloride: 96 mmol/L (ref 96–106)
Creatinine, Ser: 0.87 mg/dL (ref 0.57–1.00)
Globulin, Total: 2.8 g/dL (ref 1.5–4.5)
Glucose: 95 mg/dL (ref 70–99)
Potassium: 3.7 mmol/L (ref 3.5–5.2)
Sodium: 135 mmol/L (ref 134–144)
Total Protein: 7 g/dL (ref 6.0–8.5)
eGFR: 72 mL/min/{1.73_m2} (ref >59)

## 2022-11-20 LAB — LIPID PANEL
Chol/HDL Ratio: 4.3 ratio (ref 0.0–4.4)
Cholesterol, Total: 175 mg/dL (ref 100–199)
HDL: 41 mg/dL (ref >39)
LDL Chol Calc (NIH): 90 mg/dL (ref 0–99)
Triglycerides: 261 mg/dL — ABNORMAL HIGH (ref 0–149)
VLDL Cholesterol Cal: 44 mg/dL — ABNORMAL HIGH (ref 5–40)

## 2022-11-20 LAB — B12 AND FOLATE PANEL
Folate: 5.2 ng/mL (ref >3.0)
Vitamin B-12: 894 pg/mL (ref 232–1245)

## 2022-11-20 LAB — VITAMIN D 25 HYDROXY (VIT D DEFICIENCY, FRACTURES): Vit D, 25-Hydroxy: 50.5 ng/mL (ref 30.0–100.0)

## 2022-11-20 LAB — TSH: TSH: 2.3 u[IU]/mL (ref 0.450–4.500)

## 2022-11-21 ENCOUNTER — Encounter: Payer: Self-pay | Admitting: Physician Assistant

## 2022-11-21 ENCOUNTER — Other Ambulatory Visit: Payer: Self-pay | Admitting: Physician Assistant

## 2022-11-21 ENCOUNTER — Inpatient Hospital Stay: Payer: PPO

## 2022-11-21 ENCOUNTER — Encounter: Payer: Self-pay | Admitting: Hematology and Oncology

## 2022-11-21 ENCOUNTER — Inpatient Hospital Stay: Payer: PPO | Attending: Hematology and Oncology | Admitting: Hematology and Oncology

## 2022-11-21 VITALS — BP 132/64 | HR 55 | Temp 97.8°F | Resp 18 | Ht 64.0 in | Wt 161.6 lb

## 2022-11-21 DIAGNOSIS — D696 Thrombocytopenia, unspecified: Secondary | ICD-10-CM | POA: Diagnosis not present

## 2022-11-21 DIAGNOSIS — D72829 Elevated white blood cell count, unspecified: Secondary | ICD-10-CM | POA: Diagnosis not present

## 2022-11-21 DIAGNOSIS — D519 Vitamin B12 deficiency anemia, unspecified: Secondary | ICD-10-CM | POA: Diagnosis not present

## 2022-11-21 MED ORDER — DOXYCYCLINE HYCLATE 100 MG PO TABS: 100.0000 mg | ORAL_TABLET | Freq: Two times a day (BID) | ORAL | 0 refills | Status: DC

## 2022-11-21 NOTE — Addendum Note (Signed)
Addended by: Belva Crome A on: 11/21/2022 10:42 PM   Modules accepted: Orders

## 2022-11-21 NOTE — Progress Notes (Cosign Needed Addendum)
[facility]  [address] [place],  [place]  [address] [phone]   Addendum: She has cellulitis at her pneumonia vaccine injection site. I will place her on doxycycline. She will follow up with Ms. Earlene Plater if this does not resolve or she develops other symptoms, such as fevers/chills.  Clinic Day:  11/21/2022  Referring physician: Marianne Sofia, PA-C   HISTORY OF PRESENT ILLNESS:  The patient is a 67 y.o. female with chronic leukocytosis. She was previously seen by Dr. Melvyn Neth in July 2015 for leukocytosis, at which time her white count was between 12 and 15. She had a steroid injection about 2 months prior, but not felt to be the cause. There was no evidence to suggest a hematologic malignancy.  At that time, the leukocytosis was felt to potentially be due to prolonged smoking. She did not have progressive leukocytosis, so she was released back to her PCP to be followed.  She was referred back earlier this year with mild normochromic, normocytic anemia, as well as thrombocytopenia.  The thrombocytopenia had resolved at the time of consultation in April.  Her B12 was low normal, so we recommended oral B12 supplement with resolution of her anemia.  She did not start the B12 and when she saw Ms. Earlene Plater on June 7 her B12 was normal.  When I saw her in July, she had recurrent thrombocytopenia with borderline anemia.  Her B12 was low at 177, so I had her start vitamin B 12,000 g daily.  Her white blood count has fluctuated up and down and she has had a absolute monocytosis, that has been stable over the years.   She is here today for repeat clinical assessment and states she has been doing well. She denies progressive fatigue concerning for recurrent anemia.  She denies abnormal bleeding or bruising.  She denies recent infection or steroid use.  She had a pneumonia vaccine in the right bicep on Monday and has redness and warmth surrounding that area.  She denies fevers or  chills.  She denies night sweats, fatigue, generalized pruritus, appetite changes or weight loss.  Recent CT chest for lung cancer screening revealed stable right lower lobe pulmonary nodule measuring 1.8 cm.  There were mild coronary artery calcifications, aortic atherosclerosis and emphysema.  She states she has been instructed to take fish oil twice daily and has referred to Dr. Dulce Sellar.  Labs on October 28: WBC's 10, 64% neutrophils, 21% lymphocytes, 10% monocytes, 4% eosinophils and 1% basophils.  Hemoglobin 12.2 with an MCV of 89.  Platelets 175,000.  B12 894.  Folate 5.2.   PHYSICAL EXAM:  Blood pressure 132/64, pulse (!) 55, temperature 97.8 F (36.6 C), temperature source Oral, resp. rate 18, height 5\' 4"  (1.626 m), weight 161 lb 9.6 oz (73.3 kg), SpO2 100%. Wt Readings from Last 3 Encounters:  11/21/22 161 lb 9.6 oz (73.3 kg)  11/19/22 158 lb (71.7 kg)  08/21/22 160 lb 14.4 oz (73 kg)   Body mass index is 27.74 kg/m.  Performance status (ECOG): 1 - Symptomatic but completely ambulatory  Physical Exam Vitals and nursing note reviewed.  Constitutional:      General: She is not in acute distress.    Appearance: Normal appearance.  HENT:     Head: Normocephalic and atraumatic.     Mouth/Throat:     Mouth: Mucous membranes are moist.     Pharynx: Oropharynx is clear. No oropharyngeal exudate or posterior oropharyngeal erythema.  Eyes:  General: No scleral icterus.    Extraocular Movements: Extraocular movements intact.     Conjunctiva/sclera: Conjunctivae normal.     Pupils: Pupils are equal, round, and reactive to light.  Cardiovascular:     Rate and Rhythm: Normal rate and regular rhythm.     Heart sounds: Normal heart sounds. No murmur heard.    No friction rub. No gallop.  Pulmonary:     Effort: Pulmonary effort is normal.     Breath sounds: Normal breath sounds. No wheezing, rhonchi or rales.  Abdominal:     General: There is no distension.     Palpations: Abdomen  is soft. There is no hepatomegaly, splenomegaly or mass.     Tenderness: There is no abdominal tenderness.  Musculoskeletal:        General: Normal range of motion.     Cervical back: Normal range of motion and neck supple. No tenderness.     Right lower leg: No edema.     Left lower leg: No edema.  Lymphadenopathy:     Cervical: No cervical adenopathy.     Upper Body:     Right upper body: No supraclavicular or axillary adenopathy.     Left upper body: No supraclavicular or axillary adenopathy.     Lower Body: No right inguinal adenopathy. No left inguinal adenopathy.  Skin:    General: Skin is warm and dry.     Coloration: Skin is not jaundiced.     Findings: Erythema (8-10cm of erythema over the right bicep at injection site with overlying warmth) present. No rash.  Neurological:     Mental Status: She is alert and oriented to person, place, and time.     Cranial Nerves: No cranial nerve deficit.  Psychiatric:        Mood and Affect: Mood normal.        Behavior: Behavior normal.        Thought Content: Thought content normal.     LABS:      Latest Ref Rng & Units 11/19/2022   10:32 AM 08/21/2022    2:11 PM 06/29/2022   10:39 AM  CBC  WBC 3.4 - 10.8 x10E3/uL 10.0  12.4  11.0   Hemoglobin 11.1 - 15.9 g/dL 16.1  09.6  04.5   Hematocrit 34.0 - 46.6 % 37.1  36.8  35.9   Platelets 150 - 450 x10E3/uL 175  134  153       Latest Ref Rng & Units 11/19/2022   10:32 AM 08/21/2022    2:11 PM 06/29/2022   10:39 AM  CMP  Glucose 70 - 99 mg/dL 95  409  97   BUN 8 - 27 mg/dL 13  17  18    Creatinine 0.57 - 1.00 mg/dL 8.11  9.14  7.82   Sodium 134 - 144 mmol/L 135  133  134   Potassium 3.5 - 5.2 mmol/L 3.7  3.7  4.7   Chloride 96 - 106 mmol/L 96  95  95   CO2 20 - 29 mmol/L 24  27  24    Calcium 8.7 - 10.3 mg/dL 9.3  9.1  9.7   Total Protein 6.0 - 8.5 g/dL 7.0  7.2  7.1   Total Bilirubin 0.0 - 1.2 mg/dL 0.4  0.3  0.3   Alkaline Phos 44 - 121 IU/L 82  67  85   AST 0 - 40 IU/L 18  18   20    ALT 0 - 32 IU/L 17  20  18      STUDIES:     Patient Name: Cassandra Fox, Cassandra Fox  MRN: [id number] LOC: DI   DOB: May 19, 1955 AGE: 19  Order Date:07/13/22 Date of Service:07/16/22   Account # [id number]  Report # [id number]   [facility] Physician: Marianne Sofia PA  Exam # [id number]   Emergency Physician:      Exam(s): [id number] CT/CT LUNG CANCER SCREENING - MCR  CLINICAL DATA: Current smoker with twenty-five pack-year history  EXAM:  CT CHEST WITHOUT CONTRAST LOW-DOSE FOR LUNG CANCER SCREENING  TECHNIQUE:  Multidetector CT imaging of the chest was performed following the  standard protocol without IV contrast.  RADIATION DOSE REDUCTION: This exam was performed according to the  departmental dose-optimization program which includes automated  exposure control, adjustment of the mA and/or kV according to  patient size and/or use of iterative reconstruction technique.  COMPARISON: None Available.  FINDINGS:  Cardiovascular: Normal heart size. No pericardial effusion. Normal  caliber thoracic aorta with moderate calcified plaque. Mild coronary  artery calcifications.  Mediastinum/Nodes: Small hiatal hernia. Thyroid is unremarkable. No  enlarged lymph nodes seen in the chest.  Lungs/Pleura: Central airways are patent. Mild centrilobular  emphysema. Atelectasis of the lingula. No consolidation, pleural  effusion or pneumothorax. Small solid pulmonary nodule of the right  lower lobe measuring 1.8 mm on image 147.  Upper Abdomen: No acute abnormality.  Musculoskeletal: No chest wall mass or suspicious bone lesions  identified.  IMPRESSION:  1. Lung-RADS 2S, benign appearance or behavior. Continue annual  screening with low-dose chest CT without contrast in 12 months. S  modifier for coronary artery calcifications.  2. Mild coronary artery calcifications, recommend ASCVD risk  assessment.  3. Aortic Atherosclerosis (ICD10-I70.0) and Emphysema (ICD10-J43.9).   ASSESSMENT & PLAN:    Assessment/Plan:  67 y.o. female with chronic leukocytosis, with an absolute monocytosis, which has not progressed since 2015. This is felt to be most likely secondary to cigarette smoking.  The white count is actually better than previous. She continues to smoke about a half a pack a day.  She understands importance of complete abstinence from tobacco.  She has had resolution of the anemia and thrombocytopenia with oral B12 supplementation, which I recommend she continue.  As she has routine labs at Ms. Davis's office, I will turn her care back over to her.  We are glad to see the patient back in the future as needed.  The patient understands all the plans discussed today and is in agreement with them.     Adah Perl, PA-C   Physician Assistant [facility] [place] [phone]

## 2022-11-28 ENCOUNTER — Other Ambulatory Visit: Payer: Self-pay

## 2022-11-28 DIAGNOSIS — G629 Polyneuropathy, unspecified: Secondary | ICD-10-CM | POA: Insufficient documentation

## 2022-11-28 DIAGNOSIS — E119 Type 2 diabetes mellitus without complications: Secondary | ICD-10-CM | POA: Insufficient documentation

## 2022-11-28 DIAGNOSIS — E039 Hypothyroidism, unspecified: Secondary | ICD-10-CM | POA: Insufficient documentation

## 2022-11-28 DIAGNOSIS — E559 Vitamin D deficiency, unspecified: Secondary | ICD-10-CM | POA: Insufficient documentation

## 2022-11-28 DIAGNOSIS — E785 Hyperlipidemia, unspecified: Secondary | ICD-10-CM | POA: Insufficient documentation

## 2022-11-28 DIAGNOSIS — I10 Essential (primary) hypertension: Secondary | ICD-10-CM | POA: Insufficient documentation

## 2022-11-28 DIAGNOSIS — F32A Depression, unspecified: Secondary | ICD-10-CM | POA: Insufficient documentation

## 2022-11-28 DIAGNOSIS — N6310 Unspecified lump in the right breast, unspecified quadrant: Secondary | ICD-10-CM | POA: Insufficient documentation

## 2022-11-28 NOTE — Progress Notes (Unsigned)
Cardiology Office Note:    Date:  11/29/2022   ID:  Cassandra Fox, DOB 1955/05/29, MRN [id number]  PCP:  Marianne Sofia, PA-C  Cardiologist:  Norman Herrlich, MD   Referring MD: Marianne Sofia, PA-C  ASSESSMENT:    1. Coronary artery calcification   2. Benign hypertension   3. Type 2 diabetes mellitus with hyperlipidemia (HCC)   4. Mixed hyperlipidemia   5. Nonspecific abnormal electrocardiogram (ECG) (EKG)    PLAN:    In order of problems listed above:  Interesting combination of coronary artery calcification and a very abnormal EKG raising concerns for ischemia versus hypertrophy Further evaluation cardiac CTA as well as echocardiogram for apical hypertrophic cardiomyopathy Hypertension is well-controlled continue her current ARB thiazide diuretic Screen for LP(a) continue her statin  Next appointment 3 months   Medication Adjustments/Labs and Tests Ordered: Current medicines are reviewed at length with the patient today.  Concerns regarding medicines are outlined above.  Orders Placed This Encounter  Procedures   CT CORONARY MORPH W/CTA COR W/SCORE W/CA W/CM &/OR WO/CM   Basic Metabolic Panel (BMET)   Lipoprotein A (LPA)   EKG 12-Lead   ECHOCARDIOGRAM COMPLETE   Meds ordered this encounter  Medications   metoprolol tartrate (LOPRESSOR) 25 MG tablet    Sig: Take 1 tablet (25 mg total) by mouth once for 1 dose. Please take this medication 2 hours before CT.    Dispense:  1 tablet    Refill:  0     No chief complaint on file.   History of Present Illness:    Cassandra Fox is a 67 y.o. female who is being seen today for the evaluation of coronary artery calcification on a CT scan at the request of Marianne Sofia, [place].  She was seen with her primary care provider 11/19/2018 for in follow-up for hyper tension type 2 diabetes hypothyroidism and hyper lipidemia with a notation of aortic atherosclerosis and coronary calcification as the reason for cardiology referral.She  had a CT of her chest lung cancer screening 07/16/2022 showing mild coronary artery atherosclerosis.  She had lower extremity segmental pressures ABI performed January of this year showing normal ABI bilaterally abnormal TBI bilaterally  She is concerned regarding her coronary artery calcification of the potential of heart disease Has a history of hypertension for several decades and tells me in general has been well-tolerated Her father had CAD late in life and there is no family history of cardiomyopathy or sudden cardiac death Fortunately she is not having chest pain edema orthopnea palpitation or syncope Previous EKG was abnormal and much more abnormal and quite suggestive either of ischemia or apical hypertrophic cardiomyopathy She has been on lipid-lowering therapy and antihypertensives home blood pressures are in range Past Medical History:  Diagnosis Date   Abnormal laboratory test 05/16/2022   Acute laryngopharyngitis 05/16/2022   Anxiety    Aortic atherosclerosis (HCC) 11/19/2022   Benign hypertension 03/18/2019   BMI 26.0-26.9,adult 08/03/2020   Breast mass, right    Bursitis of right shoulder 11/14/2020   Coronary artery calcification 11/19/2022   Depression    Diabetes mellitus without complication (HCC)    Diverticulitis    Hyperlipidemia    Hyperlipidemia associated with type 2 diabetes mellitus (HCC) 11/19/2022   Hypertension    Hypothyroidism    Hypothyroidism, adult 03/18/2019   LLQ pain 01/10/2022   Major depressive disorder, recurrent episode, mild (HCC) 03/18/2019   Mixed hyperlipidemia 03/18/2019   Neuropathy    [facility]  spotted fever) 07/10/2020   Thrombocytopenia (HCC) 11/19/2022   Type 2 diabetes mellitus with hyperlipidemia (HCC) 03/18/2019   Vitamin D deficiency    Vitamin D insufficiency 03/01/2022    Past Surgical History:  Procedure Laterality Date   BREAST EXCISIONAL BIOPSY Right    benign   BREAST LUMPECTOMY WITH RADIOACTIVE SEED  LOCALIZATION Right 09/23/2015   Procedure: RIGHT BREAST LUMPECTOMY WITH RADIOACTIVE SEED LOCALIZATION;  Surgeon: Glenna Fellows, MD;  Location: [facility];  Service: General;  Laterality: Right;   CHOLECYSTECTOMY     EYE SURGERY Left 10/15/2022    Current Medications: Current Meds  Medication Sig   ALPRAZolam (XANAX) 0.5 MG tablet TAKE 1 TABLET(0.5 MG) BY MOUTH AT BEDTIME AS NEEDED FOR ANXIETY   amLODipine (NORVASC) 10 MG tablet TAKE 1 TABLET(10 MG) BY MOUTH DAILY   aspirin EC 81 MG tablet Take 81 mg by mouth daily.   citalopram (CELEXA) 40 MG tablet TAKE 1 TABLET BY MOUTH EVERY DAY   cyanocobalamin (VITAMIN B12) 1000 MCG tablet Take 1,000 mcg by mouth daily.   diphenhydrAMINE (BENADRYL) 50 MG capsule Take 50 mg by mouth at bedtime.   fluticasone (FLONASE) 50 MCG/ACT nasal spray SHAKE LIQUID AND USE 2 SPRAYS IN EACH NOSTRIL DAILY   gabapentin (NEURONTIN) 300 MG capsule TAKE 1 CAPSULE(300 MG) BY MOUTH AT BEDTIME   glucose blood (ONETOUCH VERIO) test strip USE TO CHECK BLOOD GLUCOSE TWICE DAILY   levothyroxine (SYNTHROID) 75 MCG tablet TAKE 1 TABLET(75 MCG) BY MOUTH DAILY BEFORE BREAKFAST   losartan-hydrochlorothiazide (HYZAAR) 100-25 MG tablet TAKE 1 TABLET BY MOUTH DAILY   metFORMIN (GLUCOPHAGE) 500 MG tablet Take 1 tablet (500 mg total) by mouth 2 (two) times daily.   metoprolol tartrate (LOPRESSOR) 25 MG tablet TAKE 1 TABLET(25 MG) BY MOUTH DAILY   metoprolol tartrate (LOPRESSOR) 25 MG tablet Take 1 tablet (25 mg total) by mouth once for 1 dose. Please take this medication 2 hours before CT.   Multiple Vitamins-Minerals (OCUVITE PRESERVISION PO) Take 1 tablet by mouth daily.   simvastatin (ZOCOR) 20 MG tablet TAKE 1 TABLET(20 MG) BY MOUTH AT BEDTIME   Vitamin D, Cholecalciferol, 1000 units CAPS Take 1 tablet by mouth daily.   Zinc Sulfate (ZINC 15 PO) Take 1 tablet by mouth daily.     Allergies:   Codeine   Social History   Socioeconomic History   Marital status:  Married    Spouse name: Homero Fellers   Number of children: 3   Years of education: Not on file   Highest education level: Associate degree: occupational, Scientist, product/process development, or vocational program  Occupational History   Occupation: Retired Theme park manager  Tobacco Use   Smoking status: Every Day    Current packs/day: 0.50    Average packs/day: 0.5 packs/day for 40.0 years (20.0 ttl pk-yrs)    Types: Cigarettes   Smokeless tobacco: Never  Vaping Use   Vaping status: Never Used  Substance and Sexual Activity   Alcohol use: Never    Comment: social   Drug use: Never   Sexual activity: Not Currently  Other Topics Concern   Not on file  Social History Narrative   Not on file   Social Determinants of Health   Financial Resource Strain: Low Risk  (11/19/2022)   Overall Financial Resource Strain (CARDIA)    Difficulty of Paying Living Expenses: Not hard at all  Food Insecurity: No Food Insecurity (11/19/2022)   Hunger Vital Sign    Worried About Running Out of Food  in the Last Year: Never true    Ran Out of Food in the Last Year: Never true  Transportation Needs: No Transportation Needs (11/19/2022)   PRAPARE - Administrator, Civil Service (Medical): No    Lack of Transportation (Non-Medical): No  Physical Activity: Unknown (11/19/2022)   Exercise Vital Sign    Days of Exercise per Week: Patient declined    Minutes of Exercise per Session: 30 min  Stress: Stress Concern Present (11/19/2022)   [employer] of Occupational Health - Occupational Stress Questionnaire    Feeling of Stress : Rather much  Social Connections: Unknown (11/19/2022)   Social Connection and Isolation Panel [NHANES]    Frequency of Communication with Friends and Family: More than three times a week    Frequency of Social Gatherings with Friends and Family: Once a week    Attends Religious Services: Patient declined    Database administrator or Organizations: No    Attends Engineer, structural:  Never    Marital Status: Married     Family History: The patient's family history includes Colon cancer in her mother; Diabetes in her mother; Hyperlipidemia in her father and mother; Hypertension in her father and mother; Stroke in her sister.  ROS:   ROS Please see the history of present illness.     All other systems reviewed and are negative.  EKGs/Labs/Other Studies Reviewed:    The following studies were reviewed today:  EKG Interpretation Date/Time:  Thursday November 29 2022 15:39:34 EST Ventricular Rate:  55 PR Interval:  182 QRS Duration:  82 QT Interval:  502 QTC Calculation: 480 R Axis:   51  Text Interpretation: Sinus bradycardia Left ventricular hypertrophy with repolarization abnormality ( Sokolow-Lyon ) Compared to August 2017 much more prominent ST-T abnormality now striking in nature quite suggestive of hypertrophic cardiomyopathy nonobstructive T wave inversion now evident in Inferior leads T wave inversion more evident in Anterior leads Confirmed by Norman Herrlich ([id number]) on 11/29/2022 4:28:48 PM   Recent Labs: 11/19/2022: ALT 17; BUN 13; Creatinine, Ser 0.87; Hemoglobin 12.2; Platelets 175; Potassium 3.7; Sodium 135; TSH 2.300  Recent Lipid Panel    Component Value Date/Time   CHOL 175 11/19/2022 1032   TRIG 261 (H) 11/19/2022 1032   HDL 41 11/19/2022 1032   CHOLHDL 4.3 11/19/2022 1032   LDLCALC 90 11/19/2022 1032    Physical Exam:    VS:  BP 136/68   Pulse (!) 54   Ht 5\' 4"  (1.626 m)   Wt 155 lb 12.8 oz (70.7 kg)   SpO2 98%   BMI 26.74 kg/m     Wt Readings from Last 3 Encounters:  11/29/22 155 lb 12.8 oz (70.7 kg)  11/21/22 161 lb 9.6 oz (73.3 kg)  11/19/22 158 lb (71.7 kg)     GEN: She appears younger than her age healthy no xanthoma xanthelasma or arcus senilis well nourished, well developed in no acute distress HEENT: Normal NECK: No JVD; No carotid bruits LYMPHATICS: No lymphadenopathy CARDIAC: RRR, no murmurs, rubs,  gallops RESPIRATORY:  Clear to auscultation without rales, wheezing or rhonchi  ABDOMEN: Soft, non-tender, non-distended MUSCULOSKELETAL:  No edema; No deformity  SKIN: Warm and dry NEUROLOGIC:  Alert and oriented x 3 PSYCHIATRIC:  Normal affect     Signed, Norman Herrlich, MD  11/29/2022 4:29 PM    [facility]

## 2022-11-29 ENCOUNTER — Ambulatory Visit: Payer: PPO | Attending: Cardiology | Admitting: Cardiology

## 2022-11-29 ENCOUNTER — Encounter: Payer: Self-pay | Admitting: Cardiology

## 2022-11-29 VITALS — BP 136/68 | HR 54 | Ht 64.0 in | Wt 155.8 lb

## 2022-11-29 DIAGNOSIS — I10 Essential (primary) hypertension: Secondary | ICD-10-CM | POA: Diagnosis not present

## 2022-11-29 DIAGNOSIS — I2510 Atherosclerotic heart disease of native coronary artery without angina pectoris: Secondary | ICD-10-CM

## 2022-11-29 DIAGNOSIS — R9431 Abnormal electrocardiogram [ECG] [EKG]: Secondary | ICD-10-CM

## 2022-11-29 DIAGNOSIS — E1169 Type 2 diabetes mellitus with other specified complication: Secondary | ICD-10-CM | POA: Diagnosis not present

## 2022-11-29 DIAGNOSIS — E782 Mixed hyperlipidemia: Secondary | ICD-10-CM | POA: Diagnosis not present

## 2022-11-29 DIAGNOSIS — E785 Hyperlipidemia, unspecified: Secondary | ICD-10-CM | POA: Diagnosis not present

## 2022-11-29 MED ORDER — METOPROLOL TARTRATE 25 MG PO TABS: 25.0000 mg | ORAL_TABLET | Freq: Once | ORAL | 0 refills | Status: DC

## 2022-11-29 NOTE — Patient Instructions (Signed)
Medication Instructions:  Your physician recommends that you continue on your current medications as directed. Please refer to the Current Medication list given to you today.  *If you need a refill on your cardiac medications before your next appointment, please call your pharmacy*   Lab Work: Your physician recommends that you return for lab work in:   Labs today: BMP, LPa  If you have labs (blood work) drawn today and your tests are completely normal, you will receive your results only by: MyChart Message (if you have MyChart) OR A paper copy in the mail If you have any lab test that is abnormal or we need to change your treatment, we will call you to review the results.   Testing/Procedures: Your physician has requested that you have an echocardiogram. Echocardiography is a painless test that uses sound waves to create images of your heart. It provides your doctor with information about the size and shape of your heart and how well your heart's chambers and valves are working. This procedure takes approximately one hour. There are no restrictions for this procedure. Please do NOT wear cologne, perfume, aftershave, or lotions (deodorant is allowed). Please arrive 15 minutes prior to your appointment time.  Please note: We ask at that you not bring children with you during ultrasound (echo/ vascular) testing. Due to room size and safety concerns, children are not allowed in the ultrasound rooms during exams. Our front office staff cannot provide observation of children in our lobby area while testing is being conducted. An adult accompanying a patient to their appointment will only be allowed in the ultrasound room at the discretion of the ultrasound technician under special circumstances. We apologize for any inconvenience.     Your cardiac CT will be scheduled at one of the below locations:   [facility] [address] [place], [place] [address] [phone]  OR  [facility] [address] [place], [place] [address] [phone]  OR   [facility] [address] [place], [place] [address] [phone]  If scheduled at [facility], please arrive at the [facility] and Children's Entrance (Entrance C2) of [facility] 30 minutes prior to test start time. You can use the FREE valet parking offered at entrance C (encouraged to control the heart rate for the test)  Proceed to the [facility] Radiology Department (first floor) to check-in and test prep.  All radiology patients and guests should use entrance C2 at [facility], accessed from [facility], even though the hospital's physical address listed is [address]    If scheduled at [facility] or [facility], please arrive 15 mins early for check-in and test prep.  There is spacious parking and easy access to the radiology department from the [facility] entrance. Please enter here and check-in with the desk attendant.   Please follow these instructions carefully (unless otherwise directed):  An IV will be required for this test and Nitroglycerin will be given.  Hold all erectile dysfunction medications at least 3 days (72 hrs) prior to test. (Ie viagra, cialis, sildenafil, tadalafil, etc)   On the Night Before the Test: Be sure to Drink plenty of water. Do not consume any caffeinated/decaffeinated beverages or chocolate 12 hours prior to your test. Do not take any antihistamines 12 hours prior to your test.  On the Day of the Test: Drink plenty of  water until 1 hour prior to the test. Do not eat any food 1 hour prior to test. You may take your regular medications prior to the test.  Take metoprolol (Lopressor) two hours prior to test. If you take Hyzaar please HOLD on the morning of the test. FEMALES-  please wear underwire-free bra if available, avoid dresses & tight clothing       After the Test: Drink plenty of water. After receiving IV contrast, you may experience a mild flushed feeling. This is normal. On occasion, you may experience a mild rash up to 24 hours after the test. This is not dangerous. If this occurs, you can take Benadryl 25 mg and increase your fluid intake. If you experience trouble breathing, this can be serious. If it is severe call 911 IMMEDIATELY. If it is mild, please call our office. If you take any of these medications: Glipizide/Metformin, Avandament, Glucavance, please do not take 48 hours after completing test unless otherwise instructed.  We will call to schedule your test 2-4 weeks out understanding that some insurance companies will need an authorization prior to the service being performed.   For more information and frequently asked questions, please visit our website : [email or web address]  For non-scheduling related questions, please contact the cardiac imaging nurse navigator should you have any questions/concerns: Cardiac Imaging Nurse Navigators Direct Office Dial: [phone]   For scheduling needs, including cancellations and rescheduling, please call [place], [phone].    Follow-Up: At [facility], you and your health needs are our priority.  As part of our continuing mission to provide you with exceptional heart care, we have created designated Provider Care Teams.  These Care Teams include your primary Cardiologist (physician) and Advanced Practice Providers (APPs -  Physician Assistants and Nurse Practitioners) who all work together to provide you with the care you need, when you need it.  We recommend signing up for the patient portal called "MyChart".  Sign up information is provided on this After Visit Summary.  MyChart is used to connect with patients for Virtual Visits (Telemedicine).  Patients are able to  view lab/test results, encounter notes, upcoming appointments, etc.  Non-urgent messages can be sent to your provider as well.   To learn more about what you can do with MyChart, go to [email or web address].    Your next appointment:   3 month(s)  Provider:   Norman Herrlich, MD    Other Instructions None

## 2022-12-01 LAB — BASIC METABOLIC PANEL
BUN/Creatinine Ratio: 17 (ref 12–28)
BUN: 14 mg/dL (ref 8–27)
CO2: 25 mmol/L (ref 20–29)
Calcium: 10.1 mg/dL (ref 8.7–10.3)
Chloride: 94 mmol/L — ABNORMAL LOW (ref 96–106)
Creatinine, Ser: 0.82 mg/dL (ref 0.57–1.00)
Glucose: 90 mg/dL (ref 70–99)
Potassium: 4.5 mmol/L (ref 3.5–5.2)
Sodium: 134 mmol/L (ref 134–144)
eGFR: 77 mL/min/{1.73_m2} (ref >59)

## 2022-12-01 LAB — LIPOPROTEIN A (LPA): Lipoprotein (a): 19.2 nmol/L (ref <75.0)

## 2022-12-02 ENCOUNTER — Other Ambulatory Visit: Payer: Self-pay | Admitting: Physician Assistant

## 2022-12-02 ENCOUNTER — Encounter: Payer: Self-pay | Admitting: Cardiology

## 2022-12-02 DIAGNOSIS — F330 Major depressive disorder, recurrent, mild: Secondary | ICD-10-CM

## 2022-12-14 ENCOUNTER — Telehealth (HOSPITAL_COMMUNITY): Payer: Self-pay | Admitting: *Deleted

## 2022-12-14 NOTE — Telephone Encounter (Signed)
Reaching out to patient to offer assistance regarding upcoming cardiac imaging study; pt verbalizes understanding of appt date/time, parking situation and where to check in, pre-test NPO status and medications ordered, and verified current allergies; name and call back number provided for further questions should they arise Hayley Sharpe RN Navigator Cardiac [facility] [phone] office [phone] cell  

## 2022-12-17 ENCOUNTER — Ambulatory Visit (HOSPITAL_COMMUNITY)
Admission: RE | Admit: 2022-12-17 | Discharge: 2022-12-17 | Disposition: A | Discharge status: Home / Self Care | Payer: PPO | Source: Ambulatory Visit | Attending: Cardiology | Admitting: Cardiology

## 2022-12-17 DIAGNOSIS — I2510 Atherosclerotic heart disease of native coronary artery without angina pectoris: Secondary | ICD-10-CM | POA: Insufficient documentation

## 2022-12-17 MED ORDER — DILTIAZEM HCL 25 MG/5ML IV SOLN: 10.0000 mg | INTRAVENOUS | Status: DC | PRN

## 2022-12-17 MED ORDER — NITROGLYCERIN 0.4 MG SL SUBL
SUBLINGUAL_TABLET | SUBLINGUAL | Status: AC
  Filled 2022-12-17: qty 2

## 2022-12-17 MED ORDER — SODIUM CHLORIDE 0.9 % IV BOLUS: 500.0000 mL | Freq: Once | INTRAVENOUS | Status: DC

## 2022-12-17 MED ORDER — NITROGLYCERIN 0.4 MG SL SUBL
0.8000 mg | SUBLINGUAL_TABLET | Freq: Once | SUBLINGUAL | Status: AC
  Administered 2022-12-17: 0.8 mg via SUBLINGUAL

## 2022-12-17 MED ORDER — IOHEXOL 350 MG/ML SOLN
95.0000 mL | Freq: Once | INTRAVENOUS | Status: AC | PRN
  Administered 2022-12-17: 95 mL via INTRAVENOUS

## 2022-12-17 MED ORDER — METOPROLOL TARTRATE 5 MG/5ML IV SOLN
10.0000 mg | Freq: Once | INTRAVENOUS | Status: DC | PRN
Start: 2022-12-17 — End: 2022-12-18

## 2022-12-17 NOTE — Progress Notes (Signed)
Patient c/o feeling hot and dizzy after PIV placement, rechecked BP, 87/45. Patient given 500 ml fluid bolus.  Patient states feeling better. BP 100/51

## 2022-12-18 ENCOUNTER — Encounter: Payer: Self-pay | Admitting: Cardiology

## 2022-12-25 ENCOUNTER — Other Ambulatory Visit: Payer: Self-pay | Admitting: Physician Assistant

## 2022-12-27 ENCOUNTER — Encounter: Payer: Self-pay | Admitting: Physician Assistant

## 2022-12-27 ENCOUNTER — Ambulatory Visit (INDEPENDENT_AMBULATORY_CARE_PROVIDER_SITE_OTHER): Payer: PPO | Admitting: Physician Assistant

## 2022-12-27 VITALS — BP 112/68 | HR 62 | Temp 97.1°F | Ht 64.0 in | Wt 160.0 lb

## 2022-12-27 DIAGNOSIS — J060 Acute laryngopharyngitis: Secondary | ICD-10-CM | POA: Diagnosis not present

## 2022-12-27 MED ORDER — AMOXICILLIN 875 MG PO TABS: 875.0000 mg | ORAL_TABLET | Freq: Two times a day (BID) | ORAL | 0 refills | Status: AC

## 2022-12-27 MED ORDER — HYDROCODONE BIT-HOMATROP MBR 5-1.5 MG/5ML PO SOLN: 5.0000 mL | Freq: Four times a day (QID) | ORAL | 0 refills | Status: DC | PRN

## 2022-12-28 NOTE — Progress Notes (Signed)
Acute Office Visit  Subjective:    Patient ID: Cassandra Fox, female    DOB: April 12, 1955, 67 y.o.   MRN: [id number]  Chief Complaint  Patient presents with   Cough    Congestion    HPI: Patient is in today for complaints of sinus pressure, pnd and productive cough for the past several days.  Denies fever. She recently lost her husband and has been around many people Is taking otc meds but not helping cough at night   Current Outpatient Medications:    ALPRAZolam (XANAX) 0.5 MG tablet, TAKE 1 TABLET(0.5 MG) BY MOUTH AT BEDTIME AS NEEDED FOR ANXIETY, Disp: 30 tablet, Rfl: 1   amLODipine (NORVASC) 10 MG tablet, TAKE 1 TABLET(10 MG) BY MOUTH DAILY, Disp: 90 tablet, Rfl: 0   amoxicillin (AMOXIL) 875 MG tablet, Take 1 tablet (875 mg total) by mouth 2 (two) times daily for 10 days., Disp: 20 tablet, Rfl: 0   aspirin EC 81 MG tablet, Take 81 mg by mouth daily., Disp: , Rfl:    citalopram (CELEXA) 40 MG tablet, TAKE 1 TABLET BY MOUTH EVERY DAY, Disp: 30 tablet, Rfl: 3   cyanocobalamin (VITAMIN B12) 1000 MCG tablet, Take 1,000 mcg by mouth daily., Disp: , Rfl:    diphenhydrAMINE (BENADRYL) 50 MG capsule, Take 50 mg by mouth at bedtime., Disp: , Rfl:    fluticasone (FLONASE) 50 MCG/ACT nasal spray, SHAKE LIQUID AND USE 2 SPRAYS IN EACH NOSTRIL DAILY, Disp: 16 g, Rfl: 6   gabapentin (NEURONTIN) 300 MG capsule, TAKE 1 CAPSULE(300 MG) BY MOUTH AT BEDTIME, Disp: 30 capsule, Rfl: 1   glucose blood (ONETOUCH VERIO) test strip, USE TO CHECK BLOOD GLUCOSE TWICE DAILY, Disp: 100 strip, Rfl: 4   HYDROcodone bit-homatropine (HYDROMET) 5-1.5 MG/5ML syrup, Take 5 mLs by mouth every 6 (six) hours as needed., Disp: 120 mL, Rfl: 0   levothyroxine (SYNTHROID) 75 MCG tablet, TAKE 1 TABLET(75 MCG) BY MOUTH DAILY BEFORE BREAKFAST, Disp: 90 tablet, Rfl: 1   losartan-hydrochlorothiazide (HYZAAR) 100-25 MG tablet, TAKE 1 TABLET BY MOUTH DAILY, Disp: 90 tablet, Rfl: 2   metFORMIN (GLUCOPHAGE) 500 MG tablet, Take 1  tablet (500 mg total) by mouth 2 (two) times daily., Disp: 180 tablet, Rfl: 2   metoprolol tartrate (LOPRESSOR) 25 MG tablet, TAKE 1 TABLET(25 MG) BY MOUTH DAILY, Disp: 90 tablet, Rfl: 1   Multiple Vitamins-Minerals (OCUVITE PRESERVISION PO), Take 1 tablet by mouth daily., Disp: , Rfl:    simvastatin (ZOCOR) 20 MG tablet, TAKE 1 TABLET(20 MG) BY MOUTH AT BEDTIME, Disp: 90 tablet, Rfl: 1   Vitamin D, Cholecalciferol, 1000 units CAPS, Take 1 tablet by mouth daily., Disp: , Rfl:    Zinc Sulfate (ZINC 15 PO), Take 1 tablet by mouth daily., Disp: , Rfl:    metoprolol tartrate (LOPRESSOR) 25 MG tablet, Take 1 tablet (25 mg total) by mouth once for 1 dose. Please take this medication 2 hours before CT., Disp: 1 tablet, Rfl: 0  Allergies  Allergen Reactions   Codeine Nausea Only    ROS CONSTITUTIONAL: Negative for chills, fatigue, fever,  E/N/T: see HPI CARDIOVASCULAR: Negative for chest pain, dizziness, RESPIRATORY:see HPI GASTROINTESTINAL: Negative for abdominal pain, acid reflux symptoms, constipation, diarrhea, nausea and vomiting.       Objective:    PHYSICAL EXAM:   BP 112/68 (BP Location: Left Arm, Patient Position: Sitting)   Pulse 62   Temp (!) 97.1 F (36.2 C) (Temporal)   Ht 5\' 4"  (1.626 m)  Wt 160 lb (72.6 kg)   SpO2 97%   BMI 27.46 kg/m    GEN: Well nourished, well developed, in no acute distress  HEENT: normal external ears and nose - normal external auditory canals and TMS - - Lips, Teeth and Gums - normal  Oropharynx -erythema/pnd  Cardiac: RRR; no murmurs,  Respiratory:  normal respiratory rate and pattern with no distress - normal breath sounds with no rales, rhonchi, wheezes or rubs      Assessment & Plan:    Acute laryngopharyngitis -     Amoxicillin; Take 1 tablet (875 mg total) by mouth 2 (two) times daily for 10 days.  Dispense: 20 tablet; Refill: 0 -     HYDROcodone Bit-Homatrop MBr; Take 5 mLs by mouth every 6 (six) hours as needed.  Dispense: 120  mL; Refill: 0     Follow-up: Return if symptoms worsen or fail to improve.  An After Visit Summary was printed and given to the patient.  Jettie Pagan [facility] [phone]

## 2022-12-30 ENCOUNTER — Other Ambulatory Visit: Payer: Self-pay | Admitting: Physician Assistant

## 2022-12-31 ENCOUNTER — Other Ambulatory Visit: Payer: Self-pay | Admitting: Physician Assistant

## 2022-12-31 DIAGNOSIS — E1169 Type 2 diabetes mellitus with other specified complication: Secondary | ICD-10-CM

## 2022-12-31 DIAGNOSIS — F330 Major depressive disorder, recurrent, mild: Secondary | ICD-10-CM

## 2023-01-03 ENCOUNTER — Ambulatory Visit: Payer: PPO | Attending: Cardiology

## 2023-01-03 ENCOUNTER — Encounter: Payer: Self-pay | Admitting: Cardiology

## 2023-01-03 DIAGNOSIS — I2510 Atherosclerotic heart disease of native coronary artery without angina pectoris: Secondary | ICD-10-CM

## 2023-01-03 LAB — ECHOCARDIOGRAM COMPLETE
Area-P 1/2: 3.31 cm2
MV M vel: 4.6 m/s
MV Peak grad: 84.6 mm[Hg]
S' Lateral: 3 cm

## 2023-01-04 ENCOUNTER — Encounter: Payer: Self-pay | Admitting: Cardiology

## 2023-01-04 DIAGNOSIS — R9431 Abnormal electrocardiogram [ECG] [EKG]: Secondary | ICD-10-CM

## 2023-01-04 DIAGNOSIS — I429 Cardiomyopathy, unspecified: Secondary | ICD-10-CM

## 2023-01-09 ENCOUNTER — Other Ambulatory Visit (HOSPITAL_COMMUNITY): Payer: Self-pay

## 2023-01-14 ENCOUNTER — Encounter: Payer: Self-pay | Admitting: Physician Assistant

## 2023-01-14 NOTE — Telephone Encounter (Signed)
 Care team updated and letter sent for eye exam notes.

## 2023-01-17 ENCOUNTER — Ambulatory Visit: Payer: PPO | Attending: Cardiology

## 2023-01-17 ENCOUNTER — Telehealth: Payer: Self-pay | Admitting: Emergency Medicine

## 2023-01-17 DIAGNOSIS — I429 Cardiomyopathy, unspecified: Secondary | ICD-10-CM | POA: Diagnosis not present

## 2023-01-17 NOTE — Addendum Note (Signed)
Addended by: Eleonore Chiquito on: 01/17/2023 01:17 PM   Modules accepted: Orders

## 2023-01-17 NOTE — Telephone Encounter (Signed)
Orders placed per Dr. Thersa Salt note

## 2023-01-18 LAB — HEMOGLOBIN AND HEMATOCRIT, BLOOD
Hematocrit: 34.9 % (ref 34.0–46.6)
Hemoglobin: 11.5 g/dL (ref 11.1–15.9)

## 2023-01-24 ENCOUNTER — Other Ambulatory Visit (HOSPITAL_COMMUNITY): Payer: Self-pay

## 2023-01-24 ENCOUNTER — Other Ambulatory Visit (HOSPITAL_BASED_OUTPATIENT_CLINIC_OR_DEPARTMENT_OTHER): Payer: Self-pay

## 2023-01-25 ENCOUNTER — Other Ambulatory Visit (HOSPITAL_COMMUNITY): Payer: Self-pay

## 2023-01-25 ENCOUNTER — Other Ambulatory Visit: Payer: Self-pay

## 2023-01-25 DIAGNOSIS — F330 Major depressive disorder, recurrent, mild: Secondary | ICD-10-CM

## 2023-01-25 MED ORDER — PREDNISOLONE ACETATE 1 % OP SUSP: 1.0000 [drp] | Freq: Four times a day (QID) | OPHTHALMIC | 1 refills | Status: DC

## 2023-01-25 MED ORDER — DOXYCYCLINE HYCLATE 100 MG PO TABS: 100.0000 mg | ORAL_TABLET | Freq: Two times a day (BID) | ORAL | 0 refills | Status: DC

## 2023-01-25 MED ORDER — CITALOPRAM HYDROBROMIDE 40 MG PO TABS
40.0000 mg | ORAL_TABLET | Freq: Every day | ORAL | 3 refills | Status: DC
  Filled 2023-02-21: qty 30, 30d supply, fill #0
  Filled 2023-03-20: qty 30, 30d supply, fill #1
  Filled 2023-04-18: qty 30, 30d supply, fill #2

## 2023-01-25 MED ORDER — GABAPENTIN 300 MG PO CAPS
300.0000 mg | ORAL_CAPSULE | Freq: Every day | ORAL | 1 refills | Status: DC
  Filled 2023-02-19: qty 30, 30d supply, fill #0

## 2023-01-25 MED ORDER — KETOROLAC TROMETHAMINE 0.5 % OP SOLN: 1.0000 [drp] | Freq: Four times a day (QID) | OPHTHALMIC | 1 refills | Status: DC

## 2023-01-25 MED ORDER — GATIFLOXACIN 0.5 % OP SOLN: 1.0000 [drp] | Freq: Four times a day (QID) | OPHTHALMIC | 1 refills | Status: DC

## 2023-01-25 MED ORDER — ALPRAZOLAM 0.5 MG PO TABS: 0.5000 mg | ORAL_TABLET | Freq: Every evening | ORAL | 1 refills | Status: DC | PRN

## 2023-01-25 MED ORDER — GABAPENTIN 300 MG PO CAPS: 300.0000 mg | ORAL_CAPSULE | Freq: Every day | ORAL | 1 refills | Status: DC

## 2023-01-25 MED ORDER — AMLODIPINE BESYLATE 10 MG PO TABS: 10.0000 mg | ORAL_TABLET | Freq: Every day | ORAL | 1 refills | Status: DC

## 2023-01-28 ENCOUNTER — Other Ambulatory Visit: Payer: Self-pay

## 2023-01-28 ENCOUNTER — Other Ambulatory Visit (HOSPITAL_COMMUNITY): Payer: Self-pay

## 2023-01-28 DIAGNOSIS — F330 Major depressive disorder, recurrent, mild: Secondary | ICD-10-CM

## 2023-01-28 MED ORDER — ALPRAZOLAM 0.5 MG PO TABS
0.5000 mg | ORAL_TABLET | Freq: Every evening | ORAL | 1 refills | Status: DC | PRN
  Filled 2023-01-28 – 2023-02-05 (×2): qty 30, 30d supply, fill #0
  Filled 2023-03-06: qty 30, 30d supply, fill #1

## 2023-01-28 MED FILL — Amlodipine Besylate Tab 10 MG (Base Equivalent): ORAL | 90 days supply | Qty: 90 | Fill #0 | Status: AC

## 2023-01-28 NOTE — Telephone Encounter (Signed)
 Copied from CRM [id number]. Topic: Clinical - Prescription Issue >> Jan 28, 2023 10:42 AM Antonio DEL wrote: Reason for CRM: Patient has picked up the gabapentin  (NEURONTIN ) 300 MG and the citalopram  (CELEXA ) 40 MG tablet from [facility] but needing the Xanax  to be sent to [facility] at [facility]. Was unable to pick up prescription from [facility]

## 2023-01-29 ENCOUNTER — Other Ambulatory Visit: Payer: Self-pay

## 2023-01-29 ENCOUNTER — Other Ambulatory Visit (HOSPITAL_COMMUNITY): Payer: Self-pay

## 2023-01-29 DIAGNOSIS — I429 Cardiomyopathy, unspecified: Secondary | ICD-10-CM | POA: Diagnosis not present

## 2023-01-29 MED FILL — Simvastatin Tab 20 MG: ORAL | 90 days supply | Qty: 90 | Fill #0 | Status: AC

## 2023-01-30 ENCOUNTER — Other Ambulatory Visit (HOSPITAL_COMMUNITY): Payer: Self-pay

## 2023-01-30 ENCOUNTER — Other Ambulatory Visit: Payer: Self-pay

## 2023-01-31 ENCOUNTER — Encounter: Payer: Self-pay | Admitting: Cardiology

## 2023-02-05 ENCOUNTER — Other Ambulatory Visit (HOSPITAL_BASED_OUTPATIENT_CLINIC_OR_DEPARTMENT_OTHER): Payer: Self-pay

## 2023-02-19 ENCOUNTER — Other Ambulatory Visit: Payer: Self-pay

## 2023-02-19 ENCOUNTER — Other Ambulatory Visit (HOSPITAL_COMMUNITY): Payer: Self-pay

## 2023-02-19 MED FILL — Levothyroxine Sodium Tab 75 MCG: ORAL | 90 days supply | Qty: 90 | Fill #0 | Status: AC

## 2023-02-21 ENCOUNTER — Other Ambulatory Visit (HOSPITAL_BASED_OUTPATIENT_CLINIC_OR_DEPARTMENT_OTHER): Payer: Self-pay

## 2023-02-28 ENCOUNTER — Encounter: Payer: Self-pay | Admitting: Cardiology

## 2023-02-28 NOTE — Progress Notes (Signed)
 Cardiology Office Note:    Date:  03/01/2023   ID:  Cassandra Fox, DOB Jan 18, 1956, MRN [id number]  PCP:  Nicholaus Credit, PA-C  Cardiologist:  Redell Leiter, MD    Referring MD: Nicholaus Credit, PA-C    ASSESSMENT:    1. Apical variant hypertrophic cardiomyopathy (HCC)   2. Coronary artery calcification   3. Benign hypertension   4. Mixed hyperlipidemia   5. Type 2 diabetes mellitus with hyperlipidemia (HCC)    PLAN:    In order of problems listed above:  Initially seen by me for abnormal EKG coronary artery calcification her coronary score is relatively low less than 100 and she has minimal stenosis of the coronary vessel nonflow restricting On good medical therapy including aspirin lipid-lowering and antihypertensives Blood pressure is low she tells me she gets lightheaded at home and will discontinue her calcium  channel blocker and she will trend blood pressures for 2 weeks send me a list Continue her beta-blocker and ARB Continue her statin and send a prescription for Lovaza  She will speak with her PCP regarding semaglutide  therapy   Next appointment: 6 months   Medication Adjustments/Labs and Tests Ordered: Current medicines are reviewed at length with the patient today.  Concerns regarding medicines are outlined above.  No orders of the defined types were placed in this encounter.  No orders of the defined types were placed in this encounter.    History of Present Illness:    Cassandra Fox is a 68 y.o. female with a hx of coronary artery calcification on CT scan hypertension type 2 diabetes hyperlipidemia and a very characteristic EKG indicating apical hypertrophic cardiomyopathy last seen 11/29/2022.  Echocardiogram 01/03/2023 confirmed apical hypertrophic cardiomyopathy with the apex 16 to 18 mm there was no left ventricular apical aneurysm.  She had a cardiac CTA reported 12/18/2022 with a coronary calcium  score relatively low 11.4 51st percentile and minimal  CAD less than 25% mixed plaque in the proximal left anterior sending first diagonal and proximal left circumflex coronary artery.  CT images were consistent with her apical hypertrophic cardiomyopathy with a similar wall thickness apex 18 mm  She has no family history of cardiomyopathy heart failure valve surgery or sudden death  She is concerned about body weight and will speak with her physician about semaglutide  therapy she is diabetic  She is on a statin recently started over-the-counter fish oil and after speaking to her I will send a prescription for Lovaza   I think we have confirmed her apical variant hypertrophic cardiomyopathy on 2 different techniques I do not think she needs an MRI and we have decided to cancel it  She will speak to her children to have EKGs done to screen the for the same illness unless there is another person I would not do genetic testing is much less commonly helpful in this subgroup of people with hypertrophic cardiomyopathy  I did ask her also to get a smart watch to pair with her phone to self monitor  compliance with diet, lifestyle and medications: Yes Past Medical History:  Diagnosis Date   Abnormal laboratory test 05/16/2022   Acute laryngopharyngitis 05/16/2022   Anxiety    Aortic atherosclerosis (HCC) 11/19/2022   Benign hypertension 03/18/2019   BMI 26.0-26.9,adult 08/03/2020   Breast mass, right    Bursitis of right shoulder 11/14/2020   Coronary artery calcification 11/19/2022   Depression    Diabetes mellitus without complication (HCC)    Diverticulitis    Hyperlipidemia  Hyperlipidemia associated with type 2 diabetes mellitus (HCC) 11/19/2022   Hypertension    Hypothyroidism    Hypothyroidism, adult 03/18/2019   LLQ pain 01/10/2022   Major depressive disorder, recurrent episode, mild (HCC) 03/18/2019   Mixed hyperlipidemia 03/18/2019   Need for vaccination for pneumococcus 11/19/2022   Neuropathy    [facility] spotted  fever) 07/10/2020   Thrombocytopenia (HCC) 11/19/2022   Type 2 diabetes mellitus with hyperlipidemia (HCC) 03/18/2019   Vitamin D  deficiency    Vitamin D  insufficiency 03/01/2022    Current Medications: Current Meds  Medication Sig   ALPRAZolam  (XANAX ) 0.5 MG tablet Take 1 tablet (0.5 mg total) by mouth at bedtime as needed for anxiety.   aspirin EC 81 MG tablet Take 81 mg by mouth daily.   citalopram  (CELEXA ) 40 MG tablet Take 1 tablet (40 mg total) by mouth daily.   cyanocobalamin  (VITAMIN B12) 1000 MCG tablet Take 1,000 mcg by mouth daily.   diphenhydrAMINE (BENADRYL) 50 MG capsule Take 50 mg by mouth at bedtime.   doxycycline  (VIBRA -TABS) 100 MG tablet Take 1 tablet (100 mg total) by mouth 2 (two) times daily.   fluticasone  (FLONASE ) 50 MCG/ACT nasal spray SHAKE LIQUID AND USE 2 SPRAYS IN EACH NOSTRIL DAILY   gabapentin  (NEURONTIN ) 300 MG capsule Take 1 capsule (300 mg total) by mouth at bedtime.   glucose blood (ONETOUCH VERIO) test strip USE TO CHECK BLOOD GLUCOSE TWICE DAILY   HYDROcodone  bit-homatropine (HYDROMET) 5-1.5 MG/5ML syrup Take 5 mLs by mouth every 6 (six) hours as needed.   levothyroxine  (SYNTHROID ) 75 MCG tablet Take 1 tablet (75 mcg total) by mouth daily before breakfast.   losartan -hydrochlorothiazide  (HYZAAR ) 100-25 MG tablet Take 1 tablet by mouth daily.   metFORMIN  (GLUCOPHAGE ) 500 MG tablet TAKE 1 TABLET(500 MG) BY MOUTH TWICE DAILY   metoprolol  tartrate (LOPRESSOR ) 25 MG tablet Take 1 tablet (25 mg total) by mouth daily.   Multiple Vitamins-Minerals (OCUVITE PRESERVISION PO) Take 1 tablet by mouth daily.   simvastatin  (ZOCOR ) 20 MG tablet Take 1 tablet (20 mg total) by mouth at bedtime.   Vitamin D , Cholecalciferol, 1000 units CAPS Take 1 tablet by mouth daily.   Zinc Sulfate (ZINC 15 PO) Take 1 tablet by mouth daily.   [DISCONTINUED] amLODipine  (NORVASC ) 10 MG tablet Take 1 tablet (10 mg total) by mouth daily.   [DISCONTINUED] Omega-3 Fatty Acids (FISH OIL)  1000 MG CAPS Take 1,000 mg by mouth 2 (two) times daily.      EKGs/Labs/Other Studies Reviewed:    The following studies were reviewed today:  Cardiac Studies & Procedures      ECHOCARDIOGRAM  ECHOCARDIOGRAM COMPLETE 01/03/2023  Narrative ECHOCARDIOGRAM REPORT    Patient Name:   LYA HOLBEN Date of Exam: 01/03/2023 Medical Rec #:  [id number]         Height:       64.0 in Accession #:    [id number]        Weight:       160.0 lb Date of Birth:  1955-02-11         BSA:          1.779 m Patient Age:    67 years          BP:           112/68 mmHg Patient Gender: F                 HR:  54 bpm. Exam Location:  [place]  Procedure: 2D Echo, Cardiac Doppler, Color Doppler and Strain Analysis  Indications:    Coronary artery calcification [I25.10 (ICD-10-CM)]  History:        Patient has no prior history of Echocardiogram examinations. CAD, Abnormal ECG; Risk Factors:Hypertension, Diabetes and Dyslipidemia.  Sonographer:    Charlie Jointer RDCS Referring Phys: [id number] Lucile Didonato J Niasha Devins  IMPRESSIONS   1. Apical views consistent with apical hypertrophic cardiomyopathy 16-18 mm . Left ventricular ejection fraction, by estimation, is 60 to 65%. The left ventricle has normal function. The left ventricle has no regional wall motion abnormalities. There is severe asymmetric left ventricular hypertrophy. Left ventricular diastolic parameters were normal. 2. Right ventricular systolic function is normal. The right ventricular size is normal. There is normal pulmonary artery systolic pressure. 3. The mitral valve is normal in structure. Mild mitral valve regurgitation. No evidence of mitral stenosis. 4. The aortic valve is tricuspid. Aortic valve regurgitation is not visualized. No aortic stenosis is present. 5. The inferior vena cava is normal in size with greater than 50% respiratory variability, suggesting right atrial pressure of 3 mmHg.  FINDINGS Left Ventricle: Apical  views consistent with apical hypertrophic cardiomyopathy 16-18 mm. Left ventricular ejection fraction, by estimation, is 60 to 65%. The left ventricle has normal function. The left ventricle has no regional wall motion abnormalities. The left ventricular internal cavity size was normal in size. There is severe asymmetric left ventricular hypertrophy. Left ventricular diastolic parameters were normal.  Right Ventricle: The right ventricular size is normal. No increase in right ventricular wall thickness. Right ventricular systolic function is normal. There is normal pulmonary artery systolic pressure. The tricuspid regurgitant velocity is 2.61 m/s, and with an assumed right atrial pressure of 3 mmHg, the estimated right ventricular systolic pressure is 30.2 mmHg.  Left Atrium: Left atrial size was normal in size.  Right Atrium: Right atrial size was normal in size.  Pericardium: There is no evidence of pericardial effusion.  Mitral Valve: The mitral valve is normal in structure. Mild mitral valve regurgitation. No evidence of mitral valve stenosis.  Tricuspid Valve: The tricuspid valve is normal in structure. Tricuspid valve regurgitation is mild . No evidence of tricuspid stenosis.  Aortic Valve: The aortic valve is tricuspid. Aortic valve regurgitation is not visualized. No aortic stenosis is present.  Pulmonic Valve: The pulmonic valve was normal in structure. Pulmonic valve regurgitation is not visualized. No evidence of pulmonic stenosis.  Aorta: The aortic root is normal in size and structure and the aortic root, ascending aorta, aortic arch and descending aorta are all structurally normal, with no evidence of dilitation or obstruction.  Venous: A normal flow pattern is recorded from the right upper pulmonary vein. The inferior vena cava is normal in size with greater than 50% respiratory variability, suggesting right atrial pressure of 3 mmHg.  IAS/Shunts: No atrial level shunt detected  by color flow Doppler.   LEFT VENTRICLE PLAX 2D LVIDd:         5.40 cm   Diastology LVIDs:         3.00 cm   LV e' medial:    6.64 cm/s LV PW:         0.90 cm   LV E/e' medial:  12.0 LV IVS:        1.10 cm   LV e' lateral:   7.72 cm/s LVOT diam:     1.90 cm   LV E/e' lateral: 10.3 LV SV:  78 LV SV Index:   44 LVOT Area:     2.84 cm   RIGHT VENTRICLE             IVC RV Basal diam:  3.20 cm     IVC diam: 1.90 cm RV Mid diam:    2.60 cm RV S prime:     14.10 cm/s TAPSE (M-mode): 2.8 cm  LEFT ATRIUM             Index        RIGHT ATRIUM           Index LA diam:        3.30 cm 1.85 cm/m   RA Area:     15.10 cm LA Vol (A2C):   49.1 ml 27.59 ml/m  RA Volume:   36.50 ml  20.51 ml/m LA Vol (A4C):   66.3 ml 37.26 ml/m LA Biplane Vol: 58.8 ml 33.05 ml/m AORTIC VALVE LVOT Vmax:   121.33 cm/s LVOT Vmean:  76.833 cm/s LVOT VTI:    0.276 m  AORTA Ao Root diam: 3.20 cm Ao Asc diam:  2.90 cm Ao Desc diam: 1.90 cm  MITRAL VALVE               TRICUSPID VALVE MV Area (PHT): 3.31 cm    TR Peak grad:   27.2 mmHg MV Decel Time: 229 msec    TR Vmax:        261.00 cm/s MR Peak grad: 84.6 mmHg MR Vmax:      460.00 cm/s  SHUNTS MV E velocity: 79.70 cm/s  Systemic VTI:  0.28 m MV A velocity: 62.40 cm/s  Systemic Diam: 1.90 cm MV E/A ratio:  1.28  Redell Leiter MD Electronically signed by Redell Leiter MD Signature Date/Time: 01/03/2023/3:21:10 PM    Final   MONITORS  LONG TERM MONITOR (3-14 DAYS) 01/29/2023  Narrative Patch Wear Time:  8 days and 1 hours (2024-12-26T15:31:15-0500 to 2025-01-03T16:38:42-0500)  Patient had a min HR of 48 bpm, max HR of 146 bpm, and avg HR of 61 bpm. Predominant underlying rhythm was Sinus Rhythm.  There was 1 diary event of fluttering with sinus rhythm.  There were no pauses of 3 seconds or greater no episodes of second or third-degree AV nodal block.  There were no episodes of atrial fibrillation or flutter.  Brief runs of APCs were  seen longest 8 complexes representing atrial tachycardia.  There was a single 4 beat run of PVCs was seen at a relatively slow rate 112 bpm.  SVE Couplets were rare (<1.0%), and SVE Triplets were rare (<1.0%).  Isolated VEs were rare (<1.0%), and no VE Couplets or VE Triplets were present.  CT SCANS  CT CORONARY MORPH W/CTA COR W/SCORE 12/17/2022  Addendum 01/01/2023  1:58 AM ADDENDUM REPORT: 01/01/2023 01:56  EXAM: OVER-READ INTERPRETATION  CT CHEST  The following report is an over-read performed by radiologist Dr. Oneil Devonshire of [facility] on 01/01/2023. This over-read does not include interpretation of cardiac or coronary anatomy or pathology. The coronary calcium  score/coronary CTA interpretation by the cardiologist is attached.  COMPARISON:  None.  FINDINGS: Cardiovascular: There are no significant extracardiac vascular findings.  Mediastinum/Nodes: There are no enlarged lymph nodes within the visualized mediastinum.  Lungs/Pleura: There is no pleural effusion. The visualized lungs appear clear.  Upper abdomen: No significant findings in the visualized upper abdomen.  Musculoskeletal/Chest wall: No chest wall mass or suspicious osseous findings within the visualized chest.  IMPRESSION: No  significant extracardiac findings within the visualized chest.   Electronically Signed By: Oneil Devonshire M.D. On: 01/01/2023 01:56  Narrative CLINICAL DATA:  79F with diabetes, hypertension, hyperlipidemia, coronary calcification and abnormal EKG.  EXAM: Cardiac/Coronary  CT  TECHNIQUE: The patient was scanned on a [employer].  FINDINGS: A 120 kV prospective scan was triggered in the descending thoracic aorta at 111 HU's. Axial non-contrast 3 mm slices were carried out through the heart. The data set was analyzed on a dedicated work station and scored using the Agatson method. Gantry rotation speed was 250 msecs and collimation was .6 mm.  No beta blockade and 0.8 mg of sl NTG was given. The 3D data set was reconstructed in 5% intervals of the 67-82 % of the R-R cycle. Diastolic phases were analyzed on a dedicated work station using MPR, MIP and VRT modes. The patient received 80 cc of contrast.  Aorta: Normal size.  Aortic atherosclerosis.  No dissection.  Aortic Valve:  Trileaflet.  No calcifications.  Coronary Arteries:  Normal coronary origin.  Right dominance.  RCA is a large dominant artery that gives rise to PDA and PLVB. There is no plaque.  Left main is a large artery that gives rise to LAD and LCX arteries.  LAD is a large vessel that has minimal (<25%) calcified plaque proximally. D1 is a large vessel with minimal (<25%) calcified plaque proximally.  LCX is a non-dominant artery that gives rise to one large OM1 branch and a small OM2. There is minimal (<25%) calcified plaque in the proximal OM1.  Coronary Calcium  Score:  Left main: 0  Left anterior descending artery: 11.2  Left circumflex artery: 0.156  Right coronary artery: 0  Total: 11.4  Percentile: 51st  Other findings:  Normal pulmonary vein drainage into the left atrium.  Normal let atrial appendage without a thrombus.  Normal size of the pulmonary artery.  The full cardiac cycle is not evaluated. However, there appears to be severe apical hypertrophy (1.8cm) consistent with apical variant hypertrophic cardiomyopathy. Suggest echo or cardiac MRI to better evaluate.  Non-cardiac: See separate report from [facility].  IMPRESSION: 1. Coronary calcium  score of 11.4. This was 51st percentile for age-, sex, and race-matched controls.  2. Total plaque volume 147 mm3 which is 51st percentile for age- and sex-matched controls (calcified plaque 18 mm3; non-calcified plaque 129 mm3). TPV is moderate.  3. Normal coronary origin with right dominance.  4. There is minimal (<25%) mixed plaque in the proximal LAD, D1,  and proximal LCX. CAD-RADS 1.  5. The full cardiac cycle is not evaluated. However, there appears to be severe apical hypertrophy (1.8cm) consistent with apical variant hypertrophic cardiomyopathy. Suggest echo or cardiac MRI to better evaluate.  RECOMMENDATIONS: 1. CAD-RADS 0: No evidence of CAD (0%). Consider non-atherosclerotic causes of chest pain.  2. CAD-RADS 1: Minimal non-obstructive CAD (0-24%). Consider non-atherosclerotic causes of chest pain. Consider preventive therapy and risk factor modification.  3. CAD-RADS 2: Mild non-obstructive CAD (25-49%). Consider non-atherosclerotic causes of chest pain. Consider preventive therapy and risk factor modification.  4. CAD-RADS 3: Moderate stenosis. Consider symptom-guided anti-ischemic pharmacotherapy as well as risk factor modification per guideline directed care. Additional analysis with CT FFR will be submitted.  5. CAD-RADS 4: Severe stenosis. (70-99% or > 50% left main). Cardiac catheterization or CT FFR is recommended. Consider symptom-guided anti-ischemic pharmacotherapy as well as risk factor modification per guideline directed care. Invasive coronary angiography recommended with revascularization per published guideline statements.  6.  CAD-RADS 5: Total coronary occlusion (100%). Consider cardiac catheterization or viability assessment. Consider symptom-guided anti-ischemic pharmacotherapy as well as risk factor modification per guideline directed care.  7. CAD-RADS N: Non-diagnostic study. Obstructive CAD can't be excluded. Alternative evaluation is recommended.  Annabella Scarce, MD  Electronically Signed: By: Annabella Scarce M.D. On: 12/18/2022 11:41              Recent Labs: 11/19/2022: ALT 17; Platelets 175; TSH 2.300 11/29/2022: BUN 14; Creatinine, Ser 0.82; Potassium 4.5; Sodium 134 01/17/2023: Hemoglobin 11.5  Recent Lipid Panel    Component Value Date/Time   CHOL 175 11/19/2022 1032    TRIG 261 (H) 11/19/2022 1032   HDL 41 11/19/2022 1032   CHOLHDL 4.3 11/19/2022 1032   LDLCALC 90 11/19/2022 1032    Physical Exam:    VS:  BP (!) 98/54   Pulse (!) 56   Ht 5' 4 (1.626 m)   Wt 163 lb 9.6 oz (74.2 kg)   SpO2 95%   BMI 28.08 kg/m     Wt Readings from Last 3 Encounters:  03/01/23 163 lb 9.6 oz (74.2 kg)  12/27/22 160 lb (72.6 kg)  11/29/22 155 lb 12.8 oz (70.7 kg)     GEN:  Well nourished, well developed in no acute distress HEENT: Normal NECK: No JVD; No carotid bruits LYMPHATICS: No lymphadenopathy CARDIAC: RRR, no murmurs, rubs, gallops RESPIRATORY:  Clear to auscultation without rales, wheezing or rhonchi  ABDOMEN: Soft, non-tender, non-distended MUSCULOSKELETAL:  No edema; No deformity  SKIN: Warm and dry NEUROLOGIC:  Alert and oriented x 3 PSYCHIATRIC:  Normal affect    Signed, Redell Leiter, MD  03/01/2023 1:36 PM    [facility]

## 2023-03-01 ENCOUNTER — Encounter: Payer: Self-pay | Admitting: Cardiology

## 2023-03-01 ENCOUNTER — Other Ambulatory Visit: Payer: Self-pay

## 2023-03-01 ENCOUNTER — Encounter: Payer: Self-pay | Admitting: Pharmacist

## 2023-03-01 ENCOUNTER — Other Ambulatory Visit (HOSPITAL_BASED_OUTPATIENT_CLINIC_OR_DEPARTMENT_OTHER): Payer: Self-pay

## 2023-03-01 ENCOUNTER — Other Ambulatory Visit (HOSPITAL_COMMUNITY): Payer: Self-pay

## 2023-03-01 ENCOUNTER — Ambulatory Visit: Payer: PPO | Attending: Cardiology | Admitting: Cardiology

## 2023-03-01 VITALS — BP 98/54 | HR 56 | Ht 64.0 in | Wt 163.6 lb

## 2023-03-01 DIAGNOSIS — I422 Other hypertrophic cardiomyopathy: Secondary | ICD-10-CM | POA: Diagnosis not present

## 2023-03-01 DIAGNOSIS — E782 Mixed hyperlipidemia: Secondary | ICD-10-CM

## 2023-03-01 DIAGNOSIS — I2510 Atherosclerotic heart disease of native coronary artery without angina pectoris: Secondary | ICD-10-CM

## 2023-03-01 DIAGNOSIS — E1169 Type 2 diabetes mellitus with other specified complication: Secondary | ICD-10-CM

## 2023-03-01 DIAGNOSIS — I10 Essential (primary) hypertension: Secondary | ICD-10-CM | POA: Diagnosis not present

## 2023-03-01 DIAGNOSIS — E785 Hyperlipidemia, unspecified: Secondary | ICD-10-CM | POA: Diagnosis not present

## 2023-03-01 MED ORDER — OMEGA-3-ACID ETHYL ESTERS 1 G PO CAPS
2.0000 g | ORAL_CAPSULE | Freq: Two times a day (BID) | ORAL | 3 refills | Status: DC
  Filled 2023-03-01: qty 360, 90d supply, fill #0
  Filled 2023-03-01: qty 120, 30d supply, fill #0

## 2023-03-01 NOTE — Patient Instructions (Signed)
 Medication Instructions:  Your physician has recommended you make the following change in your medication:   STOP: Amlodipine  STOP: OTC Fish Oil START: Lovaza  2 gm two times daily  *If you need a refill on your cardiac medications before your next appointment, please call your pharmacy*   Lab Work: None If you have labs (blood work) drawn today and your tests are completely normal, you will receive your results only by: MyChart Message (if you have MyChart) OR A paper copy in the mail If you have any lab test that is abnormal or we need to change your treatment, we will call you to review the results.   Testing/Procedures: None   Follow-Up: At [facility], you and your health needs are our priority.  As part of our continuing mission to provide you with exceptional heart care, we have created designated Provider Care Teams.  These Care Teams include your primary Cardiologist (physician) and Advanced Practice Providers (APPs -  Physician Assistants and Nurse Practitioners) who all work together to provide you with the care you need, when you need it.  We recommend signing up for the patient portal called MyChart.  Sign up information is provided on this After Visit Summary.  MyChart is used to connect with patients for Virtual Visits (Telemedicine).  Patients are able to view lab/test results, encounter notes, upcoming appointments, etc.  Non-urgent messages can be sent to your provider as well.   To learn more about what you can do with MyChart, go to [email or web address].    Your next appointment:   6 month(s)  Provider:   Redell Leiter, MD    Other Instructions Check and record home blood pressures daily for 2 weeks then after 2 weeks send a list of blood pressures to the office.

## 2023-03-06 ENCOUNTER — Other Ambulatory Visit (HOSPITAL_BASED_OUTPATIENT_CLINIC_OR_DEPARTMENT_OTHER): Payer: Self-pay

## 2023-03-06 ENCOUNTER — Ambulatory Visit (HOSPITAL_COMMUNITY): Payer: PPO

## 2023-03-06 MED FILL — Metoprolol Tartrate Tab 25 MG: ORAL | 90 days supply | Qty: 90 | Fill #0 | Status: AC

## 2023-03-07 ENCOUNTER — Other Ambulatory Visit (HOSPITAL_BASED_OUTPATIENT_CLINIC_OR_DEPARTMENT_OTHER): Payer: Self-pay

## 2023-03-18 DIAGNOSIS — H43823 Vitreomacular adhesion, bilateral: Secondary | ICD-10-CM | POA: Diagnosis not present

## 2023-03-18 DIAGNOSIS — H35712 Central serous chorioretinopathy, left eye: Secondary | ICD-10-CM | POA: Diagnosis not present

## 2023-03-18 DIAGNOSIS — H353132 Nonexudative age-related macular degeneration, bilateral, intermediate dry stage: Secondary | ICD-10-CM | POA: Diagnosis not present

## 2023-03-18 DIAGNOSIS — E119 Type 2 diabetes mellitus without complications: Secondary | ICD-10-CM | POA: Diagnosis not present

## 2023-03-18 DIAGNOSIS — H2513 Age-related nuclear cataract, bilateral: Secondary | ICD-10-CM | POA: Diagnosis not present

## 2023-03-18 DIAGNOSIS — Z961 Presence of intraocular lens: Secondary | ICD-10-CM | POA: Diagnosis not present

## 2023-03-18 DIAGNOSIS — H35033 Hypertensive retinopathy, bilateral: Secondary | ICD-10-CM | POA: Diagnosis not present

## 2023-03-18 DIAGNOSIS — H35371 Puckering of macula, right eye: Secondary | ICD-10-CM | POA: Diagnosis not present

## 2023-03-20 ENCOUNTER — Other Ambulatory Visit (HOSPITAL_BASED_OUTPATIENT_CLINIC_OR_DEPARTMENT_OTHER): Payer: Self-pay

## 2023-03-20 MED FILL — Losartan Potassium & Hydrochlorothiazide Tab 100-25 MG: ORAL | 90 days supply | Qty: 90 | Fill #0 | Status: AC

## 2023-03-22 ENCOUNTER — Ambulatory Visit (INDEPENDENT_AMBULATORY_CARE_PROVIDER_SITE_OTHER): Payer: PPO | Admitting: Physician Assistant

## 2023-03-22 ENCOUNTER — Encounter: Payer: Self-pay | Admitting: Physician Assistant

## 2023-03-22 ENCOUNTER — Other Ambulatory Visit (HOSPITAL_BASED_OUTPATIENT_CLINIC_OR_DEPARTMENT_OTHER): Payer: Self-pay

## 2023-03-22 VITALS — BP 100/60 | HR 60 | Temp 97.3°F | Resp 18 | Ht 64.0 in | Wt 160.8 lb

## 2023-03-22 DIAGNOSIS — F330 Major depressive disorder, recurrent, mild: Secondary | ICD-10-CM | POA: Diagnosis not present

## 2023-03-22 DIAGNOSIS — E785 Hyperlipidemia, unspecified: Secondary | ICD-10-CM

## 2023-03-22 DIAGNOSIS — E1165 Type 2 diabetes mellitus with hyperglycemia: Secondary | ICD-10-CM

## 2023-03-22 DIAGNOSIS — I152 Hypertension secondary to endocrine disorders: Secondary | ICD-10-CM | POA: Diagnosis not present

## 2023-03-22 DIAGNOSIS — E039 Hypothyroidism, unspecified: Secondary | ICD-10-CM

## 2023-03-22 DIAGNOSIS — E559 Vitamin D deficiency, unspecified: Secondary | ICD-10-CM

## 2023-03-22 DIAGNOSIS — E1169 Type 2 diabetes mellitus with other specified complication: Secondary | ICD-10-CM

## 2023-03-22 DIAGNOSIS — F419 Anxiety disorder, unspecified: Secondary | ICD-10-CM

## 2023-03-22 DIAGNOSIS — E1159 Type 2 diabetes mellitus with other circulatory complications: Secondary | ICD-10-CM

## 2023-03-22 DIAGNOSIS — D696 Thrombocytopenia, unspecified: Secondary | ICD-10-CM | POA: Diagnosis not present

## 2023-03-22 MED ORDER — ALPRAZOLAM 0.5 MG PO TABS
0.5000 mg | ORAL_TABLET | Freq: Two times a day (BID) | ORAL | 0 refills | Status: DC | PRN
  Filled 2023-03-22 – 2023-03-29 (×3): qty 60, 30d supply, fill #0

## 2023-03-22 NOTE — Progress Notes (Signed)
 Subjective:  Patient ID: Cassandra Fox, female    DOB: Mar 28, 1955  Age: 68 y.o. MRN: [id number]  Chief Complaint  Patient presents with   Medical Management of Chronic Issues    Hyperlipidemia  Hypertension  Diabetes    Pt presents for follow up of hypertension. The patient is tolerating the medication well without side effects. Compliance with treatment has been good; including taking medication as directed , maintains a healthy diet and regular exercise regimen , and following up as directed. Pt currently taking hyzaar 100/25 and metoprolol 25mg   Her bp had been running low and cardiology stopped norvasc recently Today bp at 100/60 -   Pt with history of hypothyroidism - currently on synthroid and due to check labwork  Pt with history of NIDDM - currently on glucophage 500mg  bid  -sugars have been in good control ranging under 110 fasting She is up to date with eye exam - voices no problems or concerns -   Pt with history of depression with anxiety - currently taking celexa 40mg  qd and xanax as needed - feels that she would benefit from taking xanax twice daily -   Mixed hyperlipidemia  Pt presents with hyperlipidemia.  Compliance with treatment has been good The patient is compliant with medications, maintains a low cholesterol diet , follows up as directed , and maintains an exercise regimen . The patient denies experiencing any hypercholesterolemia related symptoms. Currently on crestor 20mg  qd Pt has had consult with cardiology  Pt takes otc vit D supplements - due for labwork  Pt with neuralgias and states gabapentin works well for her  Pt with history of chronic leukocytosis and mild thrombocytopenia and currently follows with hematology - has appt this week Current Outpatient Medications on File Prior to Visit  Medication Sig Dispense Refill   aspirin EC 81 MG tablet Take 81 mg by mouth daily.     citalopram (CELEXA) 40 MG tablet Take 1 tablet (40 mg total)  by mouth daily. 30 tablet 3   cyanocobalamin (VITAMIN B12) 1000 MCG tablet Take 1,000 mcg by mouth daily.     diphenhydrAMINE (BENADRYL) 50 MG capsule Take 50 mg by mouth at bedtime.     gabapentin (NEURONTIN) 300 MG capsule Take 1 capsule (300 mg total) by mouth at bedtime. 30 capsule 1   glucose blood (ONETOUCH VERIO) test strip USE TO CHECK BLOOD GLUCOSE TWICE DAILY 100 strip 4   levothyroxine (SYNTHROID) 75 MCG tablet Take 1 tablet (75 mcg total) by mouth daily before breakfast. 90 tablet 1   losartan-hydrochlorothiazide (HYZAAR) 100-25 MG tablet Take 1 tablet by mouth daily. 90 tablet 2   metFORMIN (GLUCOPHAGE) 500 MG tablet TAKE 1 TABLET(500 MG) BY MOUTH TWICE DAILY 180 tablet 2   metoprolol tartrate (LOPRESSOR) 25 MG tablet Take 1 tablet (25 mg total) by mouth daily. 90 tablet 1   Multiple Vitamins-Minerals (OCUVITE PRESERVISION PO) Take 1 tablet by mouth daily.     simvastatin (ZOCOR) 20 MG tablet Take 1 tablet (20 mg total) by mouth at bedtime. 90 tablet 1   Vitamin D, Cholecalciferol, 1000 units CAPS Take 1 tablet by mouth daily.     Zinc Sulfate (ZINC 15 PO) Take 1 tablet by mouth daily.     No current facility-administered medications on file prior to visit.   Past Medical History:  Diagnosis Date   Abnormal laboratory test 05/16/2022   Acute laryngopharyngitis 05/16/2022   Anxiety    Aortic atherosclerosis (HCC) 11/19/2022  Benign hypertension 03/18/2019   BMI 26.0-26.9,adult 08/03/2020   Breast mass, right    Bursitis of right shoulder 11/14/2020   Coronary artery calcification 11/19/2022   Depression    Diabetes mellitus without complication (HCC)    Diverticulitis    Hyperlipidemia    Hyperlipidemia associated with type 2 diabetes mellitus (HCC) 11/19/2022   Hypertension    Hypothyroidism    Hypothyroidism, adult 03/18/2019   LLQ pain 01/10/2022   Major depressive disorder, recurrent episode, mild (HCC) 03/18/2019   Mixed hyperlipidemia 03/18/2019   Need for  vaccination for pneumococcus 11/19/2022   Neuropathy    [facility] spotted fever) 07/10/2020   Thrombocytopenia (HCC) 11/19/2022   Type 2 diabetes mellitus with hyperlipidemia (HCC) 03/18/2019   Vitamin D deficiency    Vitamin D insufficiency 03/01/2022   Past Surgical History:  Procedure Laterality Date   BREAST EXCISIONAL BIOPSY Right    benign   BREAST LUMPECTOMY WITH RADIOACTIVE SEED LOCALIZATION Right 09/23/2015   Procedure: RIGHT BREAST LUMPECTOMY WITH RADIOACTIVE SEED LOCALIZATION;  Surgeon: Glenna Fellows, MD;  Location: [facility];  Service: General;  Laterality: Right;   CHOLECYSTECTOMY     EYE SURGERY Left 10/15/2022    Family History  Problem Relation Age of Onset   Hypertension Mother    Hyperlipidemia Mother    Diabetes Mother    Colon cancer Mother    Hyperlipidemia Father    Hypertension Father    Stroke Sister    Social History   Socioeconomic History   Marital status: Married    Spouse name: Homero Fellers   Number of children: 3   Years of education: Not on file   Highest education level: Associate degree: occupational, Scientist, product/process development, or vocational program  Occupational History   Occupation: Retired Theme park manager  Tobacco Use   Smoking status: Every Day    Current packs/day: 0.50    Average packs/day: 0.5 packs/day for 40.0 years (20.0 ttl pk-yrs)    Types: Cigarettes   Smokeless tobacco: Never  Vaping Use   Vaping status: Never Used  Substance and Sexual Activity   Alcohol use: Never    Comment: social   Drug use: Never   Sexual activity: Not Currently  Other Topics Concern   Not on file  Social History Narrative   Not on file   Social Drivers of Health   Financial Resource Strain: Low Risk  (03/21/2023)   Overall Financial Resource Strain (CARDIA)    Difficulty of Paying Living Expenses: Not hard at all  Food Insecurity: No Food Insecurity (03/21/2023)   Hunger Vital Sign    Worried About Running Out of Food in the Last  Year: Never true    Ran Out of Food in the Last Year: Never true  Transportation Needs: No Transportation Needs (03/21/2023)   PRAPARE - Transportation    Lack of Transportation (Medical): No    Lack of Transportation (Non-Medical): No  Physical Activity: Unknown (11/19/2022)   Exercise Vital Sign    Days of Exercise per Week: Patient declined    Minutes of Exercise per Session: Not on file  Stress: Stress Concern Present (03/21/2023)   [employer] of Occupational Health - Occupational Stress Questionnaire    Feeling of Stress : Rather much  Social Connections: Unknown (03/21/2023)   Social Connection and Isolation Panel [NHANES]    Frequency of Communication with Friends and Family: More than three times a week    Frequency of Social Gatherings with Friends and Family: Twice a  week    Attends Religious Services: Patient declined    Active Member of Clubs or Organizations: No    Attends Banker Meetings: Not on file    Marital Status: Widowed   CONSTITUTIONAL: Negative for chills, fatigue, fever, unintentional weight gain and unintentional weight loss.  E/N/T: Negative for ear pain, nasal congestion and sore throat.  CARDIOVASCULAR: Negative for chest pain, dizziness, palpitations and pedal edema.  RESPIRATORY: Negative for recent cough and dyspnea.  GASTROINTESTINAL: Negative for abdominal pain, acid reflux symptoms, constipation, diarrhea, nausea and vomiting.  MSK: Negative for arthralgias and myalgias.  INTEGUMENTARY: Negative for rash.  NEUROLOGICAL: Negative for dizziness and headaches.  PSYCHIATRIC: see HPI      Objective:  PHYSICAL EXAM:   VS: BP 100/60 (BP Location: Left Arm, Patient Position: Sitting, Cuff Size: Large)   Pulse 60   Temp (!) 97.3 F (36.3 C) (Temporal)   Resp 18   Ht 5\' 4"  (1.626 m)   Wt 160 lb 12.8 oz (72.9 kg)   SpO2 97%   BMI 27.60 kg/m   GEN: Well nourished, well developed, in no acute distress   Cardiac: RRR; no murmurs,  rubs, or gallops,no edema -  Respiratory:  normal respiratory rate and pattern with no distress - normal breath sounds with no rales, rhonchi, wheezes or rubs GI: normal bowel sounds, no masses or tenderness  Skin: warm and dry, no rash  Neuro:  Alert and Oriented x 3,  CN II-Xii grossly intact Psych: euthymic mood, appropriate affect and demeanor  Lab Results  Component Value Date   WBC 10.0 11/19/2022   HGB 11.5 01/17/2023   HCT 34.9 01/17/2023   PLT 175 11/19/2022   GLUCOSE 90 11/29/2022   CHOL 175 11/19/2022   TRIG 261 (H) 11/19/2022   HDL 41 11/19/2022   LDLCALC 90 11/19/2022   ALT 17 11/19/2022   AST 18 11/19/2022   NA 134 11/29/2022   K 4.5 11/29/2022   CL 94 (L) 11/29/2022   CREATININE 0.82 11/29/2022   BUN 14 11/29/2022   CO2 25 11/29/2022   TSH 2.300 11/19/2022   HGBA1C 6.1 (H) 11/19/2022   MICROALBUR 30 03/19/2019      Assessment & Plan:   Problem List Items Addressed This Visit       Cardiovascular and Mediastinum   Hypertension associated with diabetes (HCC)   Relevant Orders   CBC with Differential/Platelet   Comprehensive metabolic panel Continue meds     Endocrine   Hypothyroidism, adult   Relevant Orders   TSH Continue med     Other   Hyperlipidemia associated with diabetes (HCC)   Relevant Orders   Lipid panel Watch diet - low fat/low chol Continue med   Major depressive disorder, recurrent episode, mild (HCC) Continue meds   Other Visit Diagnoses     Type 2 diabetes mellitus with hyperglycemia (HCC)       Relevant Medications   metFORMIN (GLUCOPHAGE) 500 MG tablet   Other Relevant Orders   CBC with Differential/Platelet   Comprehensive metabolic panel   Hemoglobin A1c Low carb/low sugar diet    Vitamin D insufficiency       Relevant Orders   VITAMIN D 25 Hydroxy (Vit-D Deficiency, Fractures)  Thrombocytopenia Cbc pending Follow up with hematology as directed  Aortic atherosclerosis (HCC) Coronary artery  calcification Continue crestor Recommend asa qd Follow up with cardiology as directed  Anxiety Increase xanax 0.5mg  to bid prn     .  Meds  ordered this encounter  Medications   ALPRAZolam (XANAX) 0.5 MG tablet    Sig: Take 1 tablet (0.5 mg total) by mouth 2 (two) times daily as needed for anxiety.    Dispense:  60 tablet    Refill:  0    Supervising Provider:   Blane Ohara [phone]    Orders Placed This Encounter  Procedures   CBC with Differential/Platelet   Comprehensive metabolic panel   Lipid panel   Hemoglobin A1c   VITAMIN D 25 Hydroxy (Vit-D Deficiency, Fractures)   TSH     Follow-up: Return in about 4 months (around 07/20/2023) for chronic fasting follow-up---.  An After Visit Summary was printed and given to the patient.  Jettie Pagan [facility] [phone]

## 2023-03-23 LAB — COMPREHENSIVE METABOLIC PANEL
ALT: 15 IU/L (ref 0–32)
AST: 18 IU/L (ref 0–40)
Albumin: 4.1 g/dL (ref 3.9–4.9)
Alkaline Phosphatase: 80 IU/L (ref 44–121)
BUN/Creatinine Ratio: 15 (ref 12–28)
BUN: 13 mg/dL (ref 8–27)
Bilirubin Total: 0.3 mg/dL (ref 0.0–1.2)
CO2: 25 mmol/L (ref 20–29)
Calcium: 9.3 mg/dL (ref 8.7–10.3)
Chloride: 92 mmol/L — ABNORMAL LOW (ref 96–106)
Creatinine, Ser: 0.86 mg/dL (ref 0.57–1.00)
Globulin, Total: 2.8 g/dL (ref 1.5–4.5)
Glucose: 84 mg/dL (ref 70–99)
Potassium: 4.2 mmol/L (ref 3.5–5.2)
Sodium: 131 mmol/L — ABNORMAL LOW (ref 134–144)
Total Protein: 6.9 g/dL (ref 6.0–8.5)
eGFR: 73 mL/min/{1.73_m2} (ref >59)

## 2023-03-23 LAB — CBC WITH DIFFERENTIAL/PLATELET
Basophils Absolute: 0.1 10*3/uL (ref 0.0–0.2)
Basos: 1 %
EOS (ABSOLUTE): 0.4 10*3/uL (ref 0.0–0.4)
Eos: 3 %
Hematocrit: 36.5 % (ref 34.0–46.6)
Hemoglobin: 12.3 g/dL (ref 11.1–15.9)
Immature Grans (Abs): 0 10*3/uL (ref 0.0–0.1)
Immature Granulocytes: 0 %
Lymphocytes Absolute: 2.5 10*3/uL (ref 0.7–3.1)
Lymphs: 21 %
MCH: 29.4 pg (ref 26.6–33.0)
MCHC: 33.7 g/dL (ref 31.5–35.7)
MCV: 87 fL (ref 79–97)
Monocytes Absolute: 1.2 10*3/uL — ABNORMAL HIGH (ref 0.1–0.9)
Monocytes: 10 %
Neutrophils Absolute: 7.6 10*3/uL — ABNORMAL HIGH (ref 1.4–7.0)
Neutrophils: 65 %
Platelets: 199 10*3/uL (ref 150–450)
RBC: 4.18 x10E6/uL (ref 3.77–5.28)
RDW: 12.9 % (ref 11.7–15.4)
WBC: 11.9 10*3/uL — ABNORMAL HIGH (ref 3.4–10.8)

## 2023-03-23 LAB — TSH: TSH: 3.34 u[IU]/mL (ref 0.450–4.500)

## 2023-03-23 LAB — LIPID PANEL
Chol/HDL Ratio: 3.7 ratio (ref 0.0–4.4)
Cholesterol, Total: 161 mg/dL (ref 100–199)
HDL: 43 mg/dL (ref >39)
LDL Chol Calc (NIH): 80 mg/dL (ref 0–99)
Triglycerides: 226 mg/dL — ABNORMAL HIGH (ref 0–149)
VLDL Cholesterol Cal: 38 mg/dL (ref 5–40)

## 2023-03-23 LAB — HEMOGLOBIN A1C
Est. average glucose Bld gHb Est-mCnc: 123 mg/dL
Hgb A1c MFr Bld: 5.9 % — ABNORMAL HIGH (ref 4.8–5.6)

## 2023-03-23 LAB — VITAMIN D 25 HYDROXY (VIT D DEFICIENCY, FRACTURES): Vit D, 25-Hydroxy: 41.4 ng/mL (ref 30.0–100.0)

## 2023-03-25 ENCOUNTER — Other Ambulatory Visit: Payer: Self-pay | Admitting: Physician Assistant

## 2023-03-25 ENCOUNTER — Encounter: Payer: Self-pay | Admitting: Physician Assistant

## 2023-03-25 DIAGNOSIS — E871 Hypo-osmolality and hyponatremia: Secondary | ICD-10-CM

## 2023-03-29 ENCOUNTER — Other Ambulatory Visit (HOSPITAL_BASED_OUTPATIENT_CLINIC_OR_DEPARTMENT_OTHER): Payer: Self-pay

## 2023-04-04 ENCOUNTER — Other Ambulatory Visit: Payer: Self-pay | Admitting: Physician Assistant

## 2023-04-04 ENCOUNTER — Other Ambulatory Visit

## 2023-04-04 DIAGNOSIS — E871 Hypo-osmolality and hyponatremia: Secondary | ICD-10-CM

## 2023-04-05 LAB — BASIC METABOLIC PANEL
BUN/Creatinine Ratio: 13 (ref 12–28)
BUN: 11 mg/dL (ref 8–27)
CO2: 24 mmol/L (ref 20–29)
Calcium: 9.6 mg/dL (ref 8.7–10.3)
Chloride: 94 mmol/L — ABNORMAL LOW (ref 96–106)
Creatinine, Ser: 0.88 mg/dL (ref 0.57–1.00)
Glucose: 86 mg/dL (ref 70–99)
Potassium: 3.6 mmol/L (ref 3.5–5.2)
Sodium: 134 mmol/L (ref 134–144)
eGFR: 71 mL/min/{1.73_m2} (ref >59)

## 2023-04-08 ENCOUNTER — Encounter: Payer: Self-pay | Admitting: Physician Assistant

## 2023-04-15 ENCOUNTER — Ambulatory Visit (INDEPENDENT_AMBULATORY_CARE_PROVIDER_SITE_OTHER): Payer: PPO

## 2023-04-15 VITALS — BP 150/72 | HR 57 | Resp 16 | Ht 64.0 in | Wt 160.0 lb

## 2023-04-15 DIAGNOSIS — Z0000 Encounter for general adult medical examination without abnormal findings: Secondary | ICD-10-CM

## 2023-04-15 NOTE — Patient Instructions (Signed)
 [name] , Thank you for taking time to come for your Medicare Wellness Visit. I appreciate your ongoing commitment to your health goals. Please review the following plan we discussed and let me know if I can assist you in the future.    This is a list of the screening recommended for you and due dates:  Health Maintenance  Topic Date Due   Zoster (Shingles) Vaccine (1 of 2) Never done   Eye exam for diabetics  03/20/2022   Yearly kidney health urinalysis for diabetes  06/29/2023   Complete foot exam   06/29/2023   Hemoglobin A1C  09/19/2023   Screening for Lung Cancer  12/17/2023   Yearly kidney function blood test for diabetes  04/03/2024   Medicare Annual Wellness Visit  04/14/2024   Mammogram  06/03/2024   DTaP/Tdap/Td vaccine (3 - Td or Tdap) 11/30/2030   Colon Cancer Screening  12/23/2031   Pneumonia Vaccine  Completed   Flu Shot  Completed   DEXA scan (bone density measurement)  Completed   HPV Vaccine  Aged Out   COVID-19 Vaccine  Discontinued   Hepatitis C Screening  Discontinued    Preventive Care 65 Years and Older, Female Preventive care refers to lifestyle choices and visits with your health care provider that can promote health and wellness. What does preventive care include? A yearly physical exam. This is also called an annual well check. Dental exams once or twice a year. Routine eye exams. Ask your health care provider how often you should have your eyes checked. Personal lifestyle choices, including: Daily care of your teeth and gums. Regular physical activity. Eating a healthy diet. Avoiding tobacco and drug use. Limiting alcohol use. Practicing safe sex. Taking low-dose aspirin every day. Taking vitamin and mineral supplements as recommended by your health care provider. What happens during an annual well check? The services and screenings done by your health care provider during your annual well check will depend on your age, overall health,  lifestyle risk factors, and family history of disease. Counseling  Your health care provider may ask you questions about your: Alcohol use. Tobacco use. Drug use. Emotional well-being. Home and relationship well-being. Sexual activity. Eating habits. History of falls. Memory and ability to understand (cognition). Work and work Astronomer. Reproductive health. Screening  You may have the following tests or measurements: Height, weight, and BMI. Blood pressure. Lipid and cholesterol levels. These may be checked every 5 years, or more frequently if you are over 62 years old. Skin check. Lung cancer screening. You may have this screening every year starting at age 26 if you have a 30-pack-year history of smoking and currently smoke or have quit within the past 15 years. Fecal occult blood test (FOBT) of the stool. You may have this test every year starting at age 74. Flexible sigmoidoscopy or colonoscopy. You may have a sigmoidoscopy every 5 years or a colonoscopy every 10 years starting at age 69. Hepatitis C blood test. Hepatitis B blood test. Sexually transmitted disease (STD) testing. Diabetes screening. This is done by checking your blood sugar (glucose) after you have not eaten for a while (fasting). You may have this done every 1-3 years. Bone density scan. This is done to screen for osteoporosis. You may have this done starting at age 40. Mammogram. This may be done every 1-2 years. Talk to your health care provider about how often you should have regular mammograms. Talk with your health care provider about your test results, treatment  options, and if necessary, the need for more tests. Vaccines  Your health care provider may recommend certain vaccines, such as: Influenza vaccine. This is recommended every year. Tetanus, diphtheria, and acellular pertussis (Tdap, Td) vaccine. You may need a Td booster every 10 years. Zoster vaccine. You may need this after age  60. Pneumococcal 13-valent conjugate (PCV13) vaccine. One dose is recommended after age 53. Pneumococcal polysaccharide (PPSV23) vaccine. One dose is recommended after age 66. Talk to your health care provider about which screenings and vaccines you need and how often you need them. This information is not intended to replace advice given to you by your health care provider. Make sure you discuss any questions you have with your health care provider. Document Released: 02/04/2015 Document Revised: 09/28/2015 Document Reviewed: 11/09/2014 Elsevier Interactive Patient Education  2017 [employer].  Fall Prevention in the Home Falls can cause injuries. They can happen to people of all ages. There are many things you can do to make your home safe and to help prevent falls. What can I do on the outside of my home? Regularly fix the edges of walkways and driveways and fix any cracks. Remove anything that might make you trip as you walk through a door, such as a raised step or threshold. Trim any bushes or trees on the path to your home. Use bright outdoor lighting. Clear any walking paths of anything that might make someone trip, such as rocks or tools. Regularly check to see if handrails are loose or broken. Make sure that both sides of any steps have handrails. Any raised decks and porches should have guardrails on the edges. Have any leaves, snow, or ice cleared regularly. Use sand or salt on walking paths during winter. Clean up any spills in your garage right away. This includes oil or grease spills. What can I do in the bathroom? Use night lights. Install grab bars by the toilet and in the tub and shower. Do not use towel bars as grab bars. Use non-skid mats or decals in the tub or shower. If you need to sit down in the shower, use a plastic, non-slip stool. Keep the floor dry. Clean up any water that spills on the floor as soon as it happens. Remove soap buildup in the tub or shower  regularly. Attach bath mats securely with double-sided non-slip rug tape. Do not have throw rugs and other things on the floor that can make you trip. What can I do in the bedroom? Use night lights. Make sure that you have a light by your bed that is easy to reach. Do not use any sheets or blankets that are too big for your bed. They should not hang down onto the floor. Have a firm chair that has side arms. You can use this for support while you get dressed. Do not have throw rugs and other things on the floor that can make you trip. What can I do in the kitchen? Clean up any spills right away. Avoid walking on wet floors. Keep items that you use a lot in easy-to-reach places. If you need to reach something above you, use a strong step stool that has a grab bar. Keep electrical cords out of the way. Do not use floor polish or wax that makes floors slippery. If you must use wax, use non-skid floor wax. Do not have throw rugs and other things on the floor that can make you trip. What can I do with my stairs? Do not  leave any items on the stairs. Make sure that there are handrails on both sides of the stairs and use them. Fix handrails that are broken or loose. Make sure that handrails are as long as the stairways. Check any carpeting to make sure that it is firmly attached to the stairs. Fix any carpet that is loose or worn. Avoid having throw rugs at the top or bottom of the stairs. If you do have throw rugs, attach them to the floor with carpet tape. Make sure that you have a light switch at the top of the stairs and the bottom of the stairs. If you do not have them, ask someone to add them for you. What else can I do to help prevent falls? Wear shoes that: Do not have high heels. Have rubber bottoms. Are comfortable and fit you well. Are closed at the toe. Do not wear sandals. If you use a stepladder: Make sure that it is fully opened. Do not climb a closed stepladder. Make sure that  both sides of the stepladder are locked into place. Ask someone to hold it for you, if possible. Clearly mark and make sure that you can see: Any grab bars or handrails. First and last steps. Where the edge of each step is. Use tools that help you move around (mobility aids) if they are needed. These include: Canes. Walkers. Scooters. Crutches. Turn on the lights when you go into a dark area. Replace any light bulbs as soon as they burn out. Set up your furniture so you have a clear path. Avoid moving your furniture around. If any of your floors are uneven, fix them. If there are any pets around you, be aware of where they are. Review your medicines with your doctor. Some medicines can make you feel dizzy. This can increase your chance of falling. Ask your doctor what other things that you can do to help prevent falls. This information is not intended to replace advice given to you by your health care provider. Make sure you discuss any questions you have with your health care provider. Document Released: 11/04/2008 Document Revised: 06/16/2015 Document Reviewed: 02/12/2014 Elsevier Interactive Patient Education  2017 [employer].

## 2023-04-15 NOTE — Progress Notes (Signed)
 Subjective:   Cassandra Fox is a 68 y.o. female who presents for Medicare Annual (Subsequent) preventive examination.  This wellness visit is conducted by a nurse.  The patient's medications were reviewed and reconciled since the patient's last visit.  History details were provided by the patient.  The history appears to be reliable.    Medical History: Patient history and Family history was reviewed  Medications, Allergies, and preventative health maintenance was reviewed and updated.   Visit Complete: In person   Cardiac Risk Factors include: advanced age (>37men, >83 women);diabetes mellitus;dyslipidemia;obesity (BMI >30kg/m2);smoking/ tobacco exposure     Objective:    Today's Vitals   04/15/23 1452 04/15/23 1537  BP: (!) 153/87 (!) 150/72  Pulse: (!) 57   Resp: 16   SpO2: 98%   Weight: 160 lb (72.6 kg)   Height: 5\' 4"  (1.626 m)   PainSc: 0-No pain    Body mass index is 27.46 kg/m.     11/21/2022    2:25 PM 08/21/2022    2:26 PM 05/21/2022    3:38 PM 09/23/2015    6:34 AM 09/20/2015    9:33 AM  Advanced Directives  Does Patient Have a Medical Advance Directive? No No No No No  Would patient like information on creating a medical advance directive?  No - Patient declined  No - patient declined information     Current Medications (verified) Outpatient Encounter Medications as of 04/15/2023  Medication Sig   ALPRAZolam (XANAX) 0.5 MG tablet Take 1 tablet (0.5 mg total) by mouth 2 (two) times daily as needed for anxiety.   aspirin EC 81 MG tablet Take 81 mg by mouth daily.   citalopram (CELEXA) 40 MG tablet Take 1 tablet (40 mg total) by mouth daily.   cyanocobalamin (VITAMIN B12) 1000 MCG tablet Take 1,000 mcg by mouth daily.   diphenhydrAMINE (BENADRYL) 50 MG capsule Take 50 mg by mouth at bedtime.   gabapentin (NEURONTIN) 300 MG capsule Take 1 capsule (300 mg total) by mouth at bedtime.   glucose blood (ONETOUCH VERIO) test strip USE TO CHECK BLOOD GLUCOSE TWICE  DAILY   levothyroxine (SYNTHROID) 75 MCG tablet Take 1 tablet (75 mcg total) by mouth daily before breakfast.   losartan-hydrochlorothiazide (HYZAAR) 100-25 MG tablet Take 1 tablet by mouth daily.   metFORMIN (GLUCOPHAGE) 500 MG tablet TAKE 1 TABLET(500 MG) BY MOUTH TWICE DAILY   metoprolol tartrate (LOPRESSOR) 25 MG tablet Take 1 tablet (25 mg total) by mouth daily.   Multiple Vitamins-Minerals (OCUVITE PRESERVISION PO) Take 1 tablet by mouth daily.   simvastatin (ZOCOR) 20 MG tablet Take 1 tablet (20 mg total) by mouth at bedtime.   Vitamin D, Cholecalciferol, 1000 units CAPS Take 1 tablet by mouth daily.   Zinc Sulfate (ZINC 15 PO) Take 1 tablet by mouth daily.   No facility-administered encounter medications on file as of 04/15/2023.    Allergies (verified) Codeine   History: Past Medical History:  Diagnosis Date   Abnormal laboratory test 05/16/2022   Acute laryngopharyngitis 05/16/2022   Anxiety    Aortic atherosclerosis (HCC) 11/19/2022   Benign hypertension 03/18/2019   BMI 26.0-26.9,adult 08/03/2020   Breast mass, right    Bursitis of right shoulder 11/14/2020   Coronary artery calcification 11/19/2022   Depression    Diabetes mellitus without complication (HCC)    Diverticulitis    Hyperlipidemia    Hyperlipidemia associated with type 2 diabetes mellitus (HCC) 11/19/2022   Hypertension    Hypothyroidism  Hypothyroidism, adult 03/18/2019   LLQ pain 01/10/2022   Major depressive disorder, recurrent episode, mild (HCC) 03/18/2019   Mixed hyperlipidemia 03/18/2019   Need for vaccination for pneumococcus 11/19/2022   Neuropathy    [facility] spotted fever) 07/10/2020   Thrombocytopenia (HCC) 11/19/2022   Type 2 diabetes mellitus with hyperlipidemia (HCC) 03/18/2019   Vitamin D deficiency    Vitamin D insufficiency 03/01/2022   Past Surgical History:  Procedure Laterality Date   BREAST EXCISIONAL BIOPSY Right    benign   BREAST LUMPECTOMY WITH  RADIOACTIVE SEED LOCALIZATION Right 09/23/2015   Procedure: RIGHT BREAST LUMPECTOMY WITH RADIOACTIVE SEED LOCALIZATION;  Surgeon: Glenna Fellows, MD;  Location: [facility];  Service: General;  Laterality: Right;   CHOLECYSTECTOMY     EYE SURGERY Left 10/15/2022   Family History  Problem Relation Age of Onset   Hypertension Mother    Hyperlipidemia Mother    Diabetes Mother    Colon cancer Mother    Hyperlipidemia Father    Hypertension Father    Stroke Sister    Social History   Socioeconomic History   Marital status: Widowed    Spouse name: Homero Fellers   Number of children: 3   Years of education: Not on file   Highest education level: Associate degree: occupational, Scientist, product/process development, or vocational program  Occupational History   Occupation: Retired Theme park manager  Tobacco Use   Smoking status: Every Day    Current packs/day: 0.50    Average packs/day: 0.5 packs/day for 40.0 years (20.0 ttl pk-yrs)    Types: Cigarettes   Smokeless tobacco: Never  Vaping Use   Vaping status: Never Used  Substance and Sexual Activity   Alcohol use: Never    Comment: social   Drug use: Never   Sexual activity: Not Currently  Other Topics Concern   Not on file  Social History Narrative   Not on file   Social Drivers of Health   Financial Resource Strain: Low Risk  (04/15/2023)   Overall Financial Resource Strain (CARDIA)    Difficulty of Paying Living Expenses: Not hard at all  Food Insecurity: No Food Insecurity (04/15/2023)   Hunger Vital Sign    Worried About Running Out of Food in the Last Year: Never true    Ran Out of Food in the Last Year: Never true  Transportation Needs: No Transportation Needs (04/15/2023)   PRAPARE - Administrator, Civil Service (Medical): No    Lack of Transportation (Non-Medical): No  Physical Activity: Insufficiently Active (04/15/2023)   Exercise Vital Sign    Days of Exercise per Week: 2 days    Minutes of Exercise per Session: 30  min  Stress: No Stress Concern Present (04/15/2023)   [employer] of Occupational Health - Occupational Stress Questionnaire    Feeling of Stress : Only a little  Recent Concern: Stress - Stress Concern Present (03/21/2023)   [employer] of Occupational Health - Occupational Stress Questionnaire    Feeling of Stress : Rather much  Social Connections: Unknown (04/15/2023)   Social Connection and Isolation Panel [NHANES]    Frequency of Communication with Friends and Family: More than three times a week    Frequency of Social Gatherings with Friends and Family: Twice a week    Attends Religious Services: Patient declined    Database administrator or Organizations: No    Attends Banker Meetings: Never    Marital Status: Widowed    Tobacco  Counseling Ready to quit: Not Answered Counseling given: Not Answered   Clinical Intake:  Pre-visit preparation completed: Yes  Pain : No/denies pain Pain Score: 0-No pain     BMI - recorded: 27.46 Nutritional Status: BMI 25 -29 Overweight Nutritional Risks: None Diabetes: Yes CBG done?: No Did pt. bring in CBG monitor from home?: No How often do you need to have someone help you when you read instructions, pamphlets, or other written materials from your doctor or pharmacy?: 1 - Never Interpreter Needed?: No    Activities of Daily Living    04/15/2023    2:58 PM  In your present state of health, do you have any difficulty performing the following activities:  Hearing? 0  Vision? 0  Difficulty concentrating or making decisions? 0  Walking or climbing stairs? 0  Dressing or bathing? 0  Doing errands, shopping? 0  Preparing Food and eating ? N  Using the Toilet? N  In the past six months, have you accidently leaked urine? N  Do you have problems with loss of bowel control? N  Managing your Medications? N  Managing your Finances? N  Housekeeping or managing your Housekeeping? N    Patient Care Team: Marianne Sofia, Cordelia Poche as PCP - General (Physician Assistant) Lavonna Monarch, FNP (Obstetrics and Gynecology) Misenheimer, Marcial Pacas, MD as Consulting Physician (Gastroenterology) Ablott, Minerva Areola, OD (Optometry)  Indicate any recent Medical Services you may have received from other than [facility] providers in the past year (date may be approximate).     Assessment:   This is a routine wellness examination for Rivers.  Hearing/Vision screen No results found.  Depression Screen    04/15/2023    3:38 PM 03/22/2023   10:16 AM 11/19/2022    9:57 AM 06/29/2022   10:10 AM 05/21/2022    4:02 PM 03/13/2022   11:09 AM 03/01/2022   10:58 AM  PHQ 2/9 Scores  PHQ - 2 Score 2 2 2  0 0 0 0  PHQ- 9 Score 4 4 4  0  0 0    Fall Risk    04/15/2023    2:57 PM 03/22/2023   10:16 AM 11/19/2022    9:57 AM 06/29/2022   10:09 AM 03/09/2022   11:07 AM  Fall Risk   Falls in the past year? 1 1 0 1 0  Number falls in past yr: 0 0 0 0 0  Injury with Fall? 0 0 0 0 0  Risk for fall due to : No Fall Risks No Fall Risks No Fall Risks No Fall Risks No Fall Risks  Follow up Falls evaluation completed;Education provided Falls evaluation completed Falls evaluation completed Falls prevention discussed Falls evaluation completed;Education provided    MEDICARE RISK AT HOME: Medicare Risk at Home Any stairs in or around the home?: Yes If so, are there any without handrails?: No Home free of loose throw rugs in walkways, pet beds, electrical cords, etc?: Yes Adequate lighting in your home to reduce risk of falls?: Yes Life alert?: No Use of a cane, walker or w/c?: No Grab bars in the bathroom?: No Shower chair or bench in shower?: No Elevated toilet seat or a handicapped toilet?: No  TIMED UP AND GO:  Was the test performed?  Yes  Length of time to ambulate 10 feet: 4 sec Gait steady and fast without use of assistive device    Cognitive Function:        04/15/2023    3:42 PM 03/13/2022   11:13  AM  6CIT Screen  What Year?  0 points 0 points  What month? 0 points 0 points  What time? 0 points 0 points  Count back from 20 0 points 0 points  Months in reverse 0 points 0 points  Repeat phrase 0 points 0 points  Total Score 0 points 0 points    Immunizations Immunization History  Administered Date(s) Administered   Fluad Quad(high Dose 65+) 10/09/2019   Influenza, High Dose Seasonal PF 10/30/2022   Influenza, Quadrivalent, Recombinant, Inj, Pf 11/14/2016   Influenza,inj,Quad PF,6+ Mos 10/29/2017   Influenza-Unspecified 10/22/2020, 10/06/2021   Moderna Sars-Covid-2 Vaccination 03/07/2019, 04/04/2019, 11/24/2019, 08/09/2020   PFIZER(Purple Top)SARS-COV-2 Vaccination 10/23/2021   PNEUMOCOCCAL CONJUGATE-20 11/19/2022   Pneumococcal Conjugate-13 10/14/2018   Pneumococcal Polysaccharide-23 11/08/2019   Respiratory Syncytial Virus Vaccine,Recomb Aduvanted(Arexvy) 11/09/2021   Tdap 05/30/2014, 11/29/2020    TDAP status: Up to date  Flu Vaccine status: Up to date  Pneumococcal vaccine status: Up to date  Covid-19 vaccine status: Declined, Education has been provided regarding the importance of this vaccine but patient still declined. Advised may receive this vaccine at local pharmacy or Health Dept.or vaccine clinic. Aware to provide a copy of the vaccination record if obtained from local pharmacy or Health Dept. Verbalized acceptance and understanding.  Qualifies for Shingles Vaccine? Yes   Zostavax completed No   Shingrix Completed?: No.    Education has been provided regarding the importance of this vaccine. Patient has been advised to call insurance company to determine out of pocket expense if they have not yet received this vaccine. Advised may also receive vaccine at local pharmacy or Health Dept. Verbalized acceptance and understanding.  Screening Tests Health Maintenance  Topic Date Due   Zoster Vaccines- Shingrix (1 of 2) Never done   OPHTHALMOLOGY EXAM  03/20/2022   Diabetic kidney evaluation -  Urine ACR  06/29/2023   FOOT EXAM  06/29/2023   HEMOGLOBIN A1C  09/19/2023   Lung Cancer Screening  12/17/2023   Diabetic kidney evaluation - eGFR measurement  04/03/2024   Medicare Annual Wellness (AWV)  04/14/2024   MAMMOGRAM  06/03/2024   DTaP/Tdap/Td (3 - Td or Tdap) 11/30/2030   Colonoscopy  12/23/2031   Pneumonia Vaccine 34+ Years old  Completed   INFLUENZA VACCINE  Completed   DEXA SCAN  Completed   HPV VACCINES  Aged Out   COVID-19 Vaccine  Discontinued   Hepatitis C Screening  Discontinued    Health Maintenance  Health Maintenance Due  Topic Date Due   Zoster Vaccines- Shingrix (1 of 2) Never done   OPHTHALMOLOGY EXAM  03/20/2022    Colorectal cancer screening: Type of screening: Colonoscopy.  Mammogram status: Completed  Bone Density status: Completed  Lung Cancer Screening: (Low Dose CT Chest recommended if Age 13-80 years, 20 pack-year currently smoking OR have quit w/in 15years.) does qualify.   Lung Cancer Screening Referral: Due later this year  Additional Screening:  Vision Screening: Recommended annual ophthalmology exams for early detection of glaucoma and other disorders of the eye. Is the patient up to date with their annual eye exam?  Yes   Dental Screening: Recommended annual dental exams for proper oral hygiene   Community Resource Referral / Chronic Care Management: CRR required this visit?  No   CCM required this visit?  No     Plan:     I have personally reviewed and noted the following in the patient's chart:   Medical and social history Use of alcohol, tobacco  or illicit drugs  Current medications and supplements including opioid prescriptions.  Functional ability and status Nutritional status Physical activity Advanced directives List of other physicians Hospitalizations, surgeries, and ER visits in previous 12 months Vitals Screenings to include cognitive, depression, and falls Referrals and appointments  In addition, I  have reviewed and discussed with patient certain preventive protocols, quality metrics, and best practice recommendations. A written personalized care plan for preventive services as well as general preventive health recommendations were provided to patient.     Jacklynn Bue, LPN   03/22/6008

## 2023-04-17 ENCOUNTER — Other Ambulatory Visit (HOSPITAL_BASED_OUTPATIENT_CLINIC_OR_DEPARTMENT_OTHER): Payer: Self-pay

## 2023-04-17 ENCOUNTER — Other Ambulatory Visit: Payer: Self-pay | Admitting: Physician Assistant

## 2023-04-17 ENCOUNTER — Other Ambulatory Visit (HOSPITAL_COMMUNITY): Payer: Self-pay

## 2023-04-17 DIAGNOSIS — M85851 Other specified disorders of bone density and structure, right thigh: Secondary | ICD-10-CM | POA: Diagnosis not present

## 2023-04-17 DIAGNOSIS — L308 Other specified dermatitis: Secondary | ICD-10-CM | POA: Diagnosis not present

## 2023-04-17 DIAGNOSIS — Z01419 Encounter for gynecological examination (general) (routine) without abnormal findings: Secondary | ICD-10-CM | POA: Diagnosis not present

## 2023-04-17 MED ORDER — GABAPENTIN 300 MG PO CAPS
300.0000 mg | ORAL_CAPSULE | Freq: Every day | ORAL | 1 refills | Status: DC
  Filled 2023-04-17 – 2023-05-13 (×2): qty 30, 30d supply, fill #0
  Filled 2023-06-12: qty 30, 30d supply, fill #1

## 2023-04-17 MED ORDER — CLOTRIMAZOLE 1 % EX CREA
TOPICAL_CREAM | Freq: Two times a day (BID) | CUTANEOUS | 0 refills | Status: DC
  Filled 2023-04-17: qty 15, 7d supply, fill #0
  Filled 2023-08-26: qty 15, 10d supply, fill #1

## 2023-04-17 MED ORDER — GABAPENTIN 300 MG PO CAPS
300.0000 mg | ORAL_CAPSULE | Freq: Every day | ORAL | 1 refills | Status: DC
  Filled 2023-04-17: qty 30, 30d supply, fill #0

## 2023-04-17 NOTE — Telephone Encounter (Signed)
 Copied from CRM [id number]. Topic: Clinical - Medication Refill >> Apr 17, 2023 10:24 AM Everette C wrote: Most Recent Primary Care Visit:  Provider: Jacklynn Bue  Department: [facility]  Visit Type: ANNUAL WELL VISIT, SEQUENTIAL  Date: 04/15/2023  Medication: gabapentin (NEURONTIN) 300 MG capsule [[id number]]  Has the patient contacted their pharmacy? Yes (Agent: If no, request that the patient contact the pharmacy for the refill. If patient does not wish to contact the pharmacy document the reason why and proceed with request.) (Agent: If yes, when and what did the pharmacy advise?)  Is this the correct pharmacy for this prescription? Yes If no, delete pharmacy and type the correct one.  This is the patient's preferred pharmacy:  [facility] [address] [place] [address] Phone: [phone] Fax: [phone]   Has the prescription been filled recently? Yes  Is the patient out of the medication? Yes  Has the patient been seen for an appointment in the last year OR does the patient have an upcoming appointment? Yes  Can we respond through MyChart? No  Agent: Please be advised that Rx refills may take up to 3 business days. We ask that you follow-up with your pharmacy.

## 2023-04-18 ENCOUNTER — Other Ambulatory Visit: Payer: Self-pay | Admitting: Nurse Practitioner

## 2023-04-18 ENCOUNTER — Other Ambulatory Visit (HOSPITAL_BASED_OUTPATIENT_CLINIC_OR_DEPARTMENT_OTHER): Payer: Self-pay

## 2023-04-18 DIAGNOSIS — M85851 Other specified disorders of bone density and structure, right thigh: Secondary | ICD-10-CM

## 2023-04-19 ENCOUNTER — Other Ambulatory Visit: Payer: Self-pay | Admitting: Physician Assistant

## 2023-04-19 ENCOUNTER — Other Ambulatory Visit (HOSPITAL_BASED_OUTPATIENT_CLINIC_OR_DEPARTMENT_OTHER): Payer: Self-pay

## 2023-04-19 DIAGNOSIS — E1159 Type 2 diabetes mellitus with other circulatory complications: Secondary | ICD-10-CM

## 2023-04-19 DIAGNOSIS — E669 Obesity, unspecified: Secondary | ICD-10-CM

## 2023-04-19 MED ORDER — METFORMIN HCL 500 MG PO TABS
500.0000 mg | ORAL_TABLET | Freq: Two times a day (BID) | ORAL | 2 refills | Status: DC
  Filled 2023-04-19: qty 180, 90d supply, fill #0
  Filled 2023-07-17: qty 180, 90d supply, fill #1
  Filled 2023-10-09: qty 180, 90d supply, fill #2

## 2023-04-23 ENCOUNTER — Other Ambulatory Visit (HOSPITAL_BASED_OUTPATIENT_CLINIC_OR_DEPARTMENT_OTHER): Payer: Self-pay

## 2023-04-25 ENCOUNTER — Other Ambulatory Visit (HOSPITAL_BASED_OUTPATIENT_CLINIC_OR_DEPARTMENT_OTHER): Payer: Self-pay

## 2023-04-25 ENCOUNTER — Other Ambulatory Visit (HOSPITAL_COMMUNITY): Payer: Self-pay

## 2023-04-25 ENCOUNTER — Other Ambulatory Visit: Payer: Self-pay

## 2023-04-25 MED FILL — Simvastatin Tab 20 MG: ORAL | 90 days supply | Qty: 90 | Fill #1 | Status: CN

## 2023-04-25 MED FILL — Simvastatin Tab 20 MG: ORAL | 90 days supply | Qty: 90 | Fill #0 | Status: AC

## 2023-04-26 ENCOUNTER — Other Ambulatory Visit: Payer: Self-pay | Admitting: Physician Assistant

## 2023-04-26 DIAGNOSIS — Z1231 Encounter for screening mammogram for malignant neoplasm of breast: Secondary | ICD-10-CM

## 2023-05-13 ENCOUNTER — Other Ambulatory Visit: Payer: Self-pay | Admitting: Physician Assistant

## 2023-05-13 ENCOUNTER — Other Ambulatory Visit (HOSPITAL_BASED_OUTPATIENT_CLINIC_OR_DEPARTMENT_OTHER): Payer: Self-pay

## 2023-05-13 ENCOUNTER — Other Ambulatory Visit: Payer: Self-pay

## 2023-05-13 MED ORDER — LEVOTHYROXINE SODIUM 75 MCG PO TABS
75.0000 ug | ORAL_TABLET | Freq: Every day | ORAL | 1 refills | Status: DC
  Filled 2023-05-13: qty 90, 90d supply, fill #0
  Filled 2023-08-13: qty 90, 90d supply, fill #1

## 2023-05-16 ENCOUNTER — Telehealth: Payer: Self-pay

## 2023-05-16 ENCOUNTER — Other Ambulatory Visit: Payer: Self-pay | Admitting: Physician Assistant

## 2023-05-16 ENCOUNTER — Other Ambulatory Visit (HOSPITAL_BASED_OUTPATIENT_CLINIC_OR_DEPARTMENT_OTHER): Payer: Self-pay

## 2023-05-16 DIAGNOSIS — F330 Major depressive disorder, recurrent, mild: Secondary | ICD-10-CM

## 2023-05-16 MED ORDER — CITALOPRAM HYDROBROMIDE 40 MG PO TABS
40.0000 mg | ORAL_TABLET | Freq: Every day | ORAL | 1 refills | Status: DC
  Filled 2023-05-16: qty 90, 90d supply, fill #0
  Filled 2023-08-13: qty 90, 90d supply, fill #1

## 2023-05-16 NOTE — Telephone Encounter (Signed)
 Copied from CRM [id number]. Topic: General - Billing Inquiry >> May 15, 2023  3:01 PM Star [place] wrote: Reason for CRM: Patient received a bill from [facility] and wants to make sure the billing was done correctly- please call [phone]

## 2023-05-16 NOTE — Telephone Encounter (Signed)
 I left a message stating that she will need to reach out to [facility] regarding a bill. We do not handle the bills for [facility].

## 2023-05-16 NOTE — Telephone Encounter (Signed)
 I spoke with the patient. Patient notified that she will need to contact [facility] and she can reach out to her insurance for more information.   Copied from CRM [id number]. Topic: General - Billing Inquiry >> May 16, 2023  9:42 AM Lotus [place] B wrote: Reason for CRM: pt called in about a billing question that she has to pay $10 to get her blood drawn . She would like it if someone from the clinic can give her a call .

## 2023-05-20 ENCOUNTER — Other Ambulatory Visit: Payer: Self-pay | Admitting: Physician Assistant

## 2023-05-20 DIAGNOSIS — F419 Anxiety disorder, unspecified: Secondary | ICD-10-CM

## 2023-05-21 ENCOUNTER — Other Ambulatory Visit (HOSPITAL_BASED_OUTPATIENT_CLINIC_OR_DEPARTMENT_OTHER): Payer: Self-pay

## 2023-05-21 MED ORDER — ALPRAZOLAM 0.5 MG PO TABS
0.5000 mg | ORAL_TABLET | Freq: Two times a day (BID) | ORAL | 0 refills | Status: DC | PRN
  Filled 2023-05-21: qty 60, 30d supply, fill #0

## 2023-05-29 ENCOUNTER — Other Ambulatory Visit: Payer: Self-pay | Admitting: Physician Assistant

## 2023-05-29 ENCOUNTER — Other Ambulatory Visit (HOSPITAL_BASED_OUTPATIENT_CLINIC_OR_DEPARTMENT_OTHER): Payer: Self-pay

## 2023-05-29 MED ORDER — METOPROLOL TARTRATE 25 MG PO TABS
25.0000 mg | ORAL_TABLET | Freq: Every day | ORAL | 1 refills | Status: DC
  Filled 2023-05-29: qty 90, 90d supply, fill #0
  Filled 2023-06-12 – 2023-08-26 (×2): qty 90, 90d supply, fill #1

## 2023-05-30 ENCOUNTER — Encounter (HOSPITAL_COMMUNITY): Payer: Self-pay

## 2023-06-05 ENCOUNTER — Encounter (HOSPITAL_BASED_OUTPATIENT_CLINIC_OR_DEPARTMENT_OTHER): Payer: Self-pay | Admitting: Radiology

## 2023-06-05 ENCOUNTER — Ambulatory Visit (INDEPENDENT_AMBULATORY_CARE_PROVIDER_SITE_OTHER)
Admission: RE | Admit: 2023-06-05 | Discharge: 2023-06-05 | Disposition: A | Discharge status: Home / Self Care | Source: Ambulatory Visit | Attending: Physician Assistant | Admitting: Physician Assistant

## 2023-06-05 DIAGNOSIS — Z1231 Encounter for screening mammogram for malignant neoplasm of breast: Secondary | ICD-10-CM

## 2023-06-06 ENCOUNTER — Ambulatory Visit

## 2023-06-12 ENCOUNTER — Other Ambulatory Visit (HOSPITAL_BASED_OUTPATIENT_CLINIC_OR_DEPARTMENT_OTHER): Payer: Self-pay

## 2023-06-15 MED FILL — Losartan Potassium & Hydrochlorothiazide Tab 100-25 MG: ORAL | 90 days supply | Qty: 90 | Fill #1 | Status: AC

## 2023-06-18 ENCOUNTER — Other Ambulatory Visit (HOSPITAL_BASED_OUTPATIENT_CLINIC_OR_DEPARTMENT_OTHER): Payer: Self-pay

## 2023-06-18 DIAGNOSIS — H35371 Puckering of macula, right eye: Secondary | ICD-10-CM | POA: Diagnosis not present

## 2023-06-18 DIAGNOSIS — H35712 Central serous chorioretinopathy, left eye: Secondary | ICD-10-CM | POA: Diagnosis not present

## 2023-06-18 DIAGNOSIS — H2512 Age-related nuclear cataract, left eye: Secondary | ICD-10-CM | POA: Diagnosis not present

## 2023-06-18 DIAGNOSIS — E119 Type 2 diabetes mellitus without complications: Secondary | ICD-10-CM | POA: Diagnosis not present

## 2023-06-18 DIAGNOSIS — H35033 Hypertensive retinopathy, bilateral: Secondary | ICD-10-CM | POA: Diagnosis not present

## 2023-06-18 DIAGNOSIS — H43823 Vitreomacular adhesion, bilateral: Secondary | ICD-10-CM | POA: Diagnosis not present

## 2023-06-18 DIAGNOSIS — H353132 Nonexudative age-related macular degeneration, bilateral, intermediate dry stage: Secondary | ICD-10-CM | POA: Diagnosis not present

## 2023-06-18 DIAGNOSIS — Z961 Presence of intraocular lens: Secondary | ICD-10-CM | POA: Diagnosis not present

## 2023-07-12 ENCOUNTER — Other Ambulatory Visit (HOSPITAL_BASED_OUTPATIENT_CLINIC_OR_DEPARTMENT_OTHER): Payer: Self-pay

## 2023-07-12 ENCOUNTER — Other Ambulatory Visit: Payer: Self-pay | Admitting: Physician Assistant

## 2023-07-12 MED ORDER — GABAPENTIN 300 MG PO CAPS
300.0000 mg | ORAL_CAPSULE | Freq: Every day | ORAL | 1 refills | Status: DC
  Filled 2023-07-12: qty 30, 30d supply, fill #0
  Filled 2023-08-12: qty 30, 30d supply, fill #1

## 2023-07-17 ENCOUNTER — Other Ambulatory Visit (HOSPITAL_BASED_OUTPATIENT_CLINIC_OR_DEPARTMENT_OTHER): Payer: Self-pay

## 2023-07-17 ENCOUNTER — Other Ambulatory Visit: Payer: Self-pay | Admitting: Physician Assistant

## 2023-07-17 DIAGNOSIS — F419 Anxiety disorder, unspecified: Secondary | ICD-10-CM

## 2023-07-18 ENCOUNTER — Other Ambulatory Visit (HOSPITAL_BASED_OUTPATIENT_CLINIC_OR_DEPARTMENT_OTHER): Payer: Self-pay

## 2023-07-18 MED ORDER — ALPRAZOLAM 0.5 MG PO TABS
0.5000 mg | ORAL_TABLET | Freq: Two times a day (BID) | ORAL | 0 refills | Status: DC | PRN
  Filled 2023-07-18: qty 60, 30d supply, fill #0

## 2023-07-18 MED ORDER — SIMVASTATIN 20 MG PO TABS
20.0000 mg | ORAL_TABLET | Freq: Every day | ORAL | 1 refills | Status: DC
  Filled 2023-07-18: qty 90, 90d supply, fill #0

## 2023-07-19 ENCOUNTER — Other Ambulatory Visit: Payer: Self-pay

## 2023-07-19 DIAGNOSIS — I2510 Atherosclerotic heart disease of native coronary artery without angina pectoris: Secondary | ICD-10-CM

## 2023-07-23 ENCOUNTER — Ambulatory Visit (INDEPENDENT_AMBULATORY_CARE_PROVIDER_SITE_OTHER): Payer: PPO | Admitting: Physician Assistant

## 2023-07-23 ENCOUNTER — Encounter: Payer: Self-pay | Admitting: Physician Assistant

## 2023-07-23 VITALS — BP 132/66 | HR 71 | Temp 97.6°F | Ht 64.0 in | Wt 164.4 lb

## 2023-07-23 DIAGNOSIS — E785 Hyperlipidemia, unspecified: Secondary | ICD-10-CM | POA: Diagnosis not present

## 2023-07-23 DIAGNOSIS — D696 Thrombocytopenia, unspecified: Secondary | ICD-10-CM | POA: Diagnosis not present

## 2023-07-23 DIAGNOSIS — E1165 Type 2 diabetes mellitus with hyperglycemia: Secondary | ICD-10-CM | POA: Diagnosis not present

## 2023-07-23 DIAGNOSIS — I152 Hypertension secondary to endocrine disorders: Secondary | ICD-10-CM

## 2023-07-23 DIAGNOSIS — E1159 Type 2 diabetes mellitus with other circulatory complications: Secondary | ICD-10-CM

## 2023-07-23 DIAGNOSIS — E1169 Type 2 diabetes mellitus with other specified complication: Secondary | ICD-10-CM | POA: Diagnosis not present

## 2023-07-23 DIAGNOSIS — E559 Vitamin D deficiency, unspecified: Secondary | ICD-10-CM

## 2023-07-23 DIAGNOSIS — E538 Deficiency of other specified B group vitamins: Secondary | ICD-10-CM

## 2023-07-23 DIAGNOSIS — E039 Hypothyroidism, unspecified: Secondary | ICD-10-CM

## 2023-07-23 DIAGNOSIS — M85851 Other specified disorders of bone density and structure, right thigh: Secondary | ICD-10-CM | POA: Insufficient documentation

## 2023-07-23 HISTORY — DX: Other specified disorders of bone density and structure, right thigh: M85.851

## 2023-07-23 NOTE — Progress Notes (Signed)
 Subjective:  Patient ID: Cassandra Fox, female    DOB: 10/07/1955  Age: 68 y.o. MRN: [id number]  Chief Complaint  Patient presents with   Medical Management of Chronic Issues    Hyperlipidemia  Hypertension  Diabetes    Pt presents for follow up of hypertension. The patient is tolerating the medication well without side effects. Compliance with treatment has been good; including taking medication as directed , maintains a healthy diet and regular exercise regimen , and following up as directed. Pt currently taking hyzaar  100/25 and metoprolol  25mg     Pt with history of hypothyroidism - currently on synthroid  75mcg and due to check labwork  Pt with history of NIDDM - currently on glucophage  500mg  bid  -sugars have been in good control ranging under 110 fasting She is up to date with eye exam - voices no problems or concerns -   Pt with history of depression with anxiety - currently taking celexa  40mg  qd and xanax  as needed - feels that she would benefit from taking xanax  twice daily -   Mixed hyperlipidemia  Pt presents with hyperlipidemia.  Compliance with treatment has been good The patient is compliant with medications, maintains a low cholesterol diet , follows up as directed , and maintains an exercise regimen . The patient denies experiencing any hypercholesterolemia related symptoms. Currently on simvastatin  20mg  qd  Pt with B12 def - taking otc supplement - due for labwork  Pt takes otc vit D supplements - due for labwork  Pt with neuralgias and states gabapentin  works well for her  Pt with history of chronic leukocytosis and mild thrombocytopenia- has been released from hematology Current Outpatient Medications on File Prior to Visit  Medication Sig Dispense Refill   ALPRAZolam  (XANAX ) 0.5 MG tablet Take 1 tablet (0.5 mg total) by mouth 2 (two) times daily as needed for anxiety. 60 tablet 0   aspirin EC 81 MG tablet Take 81 mg by mouth daily.     citalopram   (CELEXA ) 40 MG tablet Take 1 tablet (40 mg total) by mouth daily. 90 tablet 1   clotrimazole  (LOTRIMIN ) 1 % cream apply to affected area(s) Externally Twice a day for 2 to 4 weeks 30 g 0   cyanocobalamin  (VITAMIN B12) 1000 MCG tablet Take 1,000 mcg by mouth daily.     diphenhydrAMINE (BENADRYL) 50 MG capsule Take 50 mg by mouth at bedtime.     famotidine (PEPCID) 20 MG tablet Take 20 mg by mouth 2 (two) times daily.     gabapentin  (NEURONTIN ) 300 MG capsule Take 1 capsule (300 mg total) by mouth at bedtime. 30 capsule 1   glucose blood (ONETOUCH VERIO) test strip USE TO CHECK BLOOD GLUCOSE TWICE DAILY 100 strip 4   levothyroxine  (SYNTHROID ) 75 MCG tablet Take 1 tablet (75 mcg total) by mouth daily before breakfast. 90 tablet 1   losartan -hydrochlorothiazide  (HYZAAR ) 100-25 MG tablet Take 1 tablet by mouth daily. 90 tablet 2   metFORMIN  (GLUCOPHAGE ) 500 MG tablet Take 1 tablet (500 mg total) by mouth 2 (two) times daily. 180 tablet 2   metoprolol  tartrate (LOPRESSOR ) 25 MG tablet Take 1 tablet (25 mg total) by mouth daily. 90 tablet 1   Multiple Vitamins-Minerals (OCUVITE PRESERVISION PO) Take 1 tablet by mouth daily.     simvastatin  (ZOCOR ) 20 MG tablet Take 1 tablet (20 mg total) by mouth at bedtime. 90 tablet 1   Vitamin D , Cholecalciferol, 1000 units CAPS Take 1 tablet by mouth daily.  Zinc Sulfate (ZINC 15 PO) Take 1 tablet by mouth daily.     No current facility-administered medications on file prior to visit.   Past Medical History:  Diagnosis Date   Abnormal laboratory test 05/16/2022   Acute laryngopharyngitis 05/16/2022   Anxiety    Aortic atherosclerosis (HCC) 11/19/2022   Benign hypertension 03/18/2019   BMI 26.0-26.9,adult 08/03/2020   Breast mass, right    Bursitis of right shoulder 11/14/2020   Coronary artery calcification 11/19/2022   Depression    Diabetes mellitus without complication (HCC)    Diverticulitis    Hyperlipidemia    Hyperlipidemia associated with  type 2 diabetes mellitus (HCC) 11/19/2022   Hypertension    Hypothyroidism    Hypothyroidism, adult 03/18/2019   LLQ pain 01/10/2022   Major depressive disorder, recurrent episode, mild (HCC) 03/18/2019   Mixed hyperlipidemia 03/18/2019   Need for vaccination for pneumococcus 11/19/2022   Neuropathy    [facility] spotted fever) 07/10/2020   Thrombocytopenia (HCC) 11/19/2022   Type 2 diabetes mellitus with hyperlipidemia (HCC) 03/18/2019   Vitamin D  deficiency    Vitamin D  insufficiency 03/01/2022   Past Surgical History:  Procedure Laterality Date   BREAST EXCISIONAL BIOPSY Right    benign   BREAST LUMPECTOMY WITH RADIOACTIVE SEED LOCALIZATION Right 09/23/2015   Procedure: RIGHT BREAST LUMPECTOMY WITH RADIOACTIVE SEED LOCALIZATION;  Surgeon: Morene Olives, MD;  Location: [facility];  Service: General;  Laterality: Right;   CHOLECYSTECTOMY     EYE SURGERY Left 10/15/2022    Family History  Problem Relation Age of Onset   Hypertension Mother    Hyperlipidemia Mother    Diabetes Mother    Colon cancer Mother    Hyperlipidemia Father    Hypertension Father    Stroke Sister    Social History   Socioeconomic History   Marital status: Widowed    Spouse name: Dempsey   Number of children: 3   Years of education: Not on file   Highest education level: Associate degree: occupational, Scientist, product/process development, or vocational program  Occupational History   Occupation: Retired Theme park manager  Tobacco Use   Smoking status: Every Day    Current packs/day: 0.50    Average packs/day: 0.5 packs/day for 40.0 years (20.0 ttl pk-yrs)    Types: Cigarettes   Smokeless tobacco: Never  Vaping Use   Vaping status: Never Used  Substance and Sexual Activity   Alcohol use: Never    Comment: social   Drug use: Never   Sexual activity: Not Currently  Other Topics Concern   Not on file  Social History Narrative   Not on file   Social Drivers of Health   Financial Resource  Strain: Low Risk  (07/22/2023)   Overall Financial Resource Strain (CARDIA)    Difficulty of Paying Living Expenses: Not hard at all  Food Insecurity: No Food Insecurity (07/22/2023)   Hunger Vital Sign    Worried About Running Out of Food in the Last Year: Never true    Ran Out of Food in the Last Year: Never true  Transportation Needs: No Transportation Needs (07/22/2023)   PRAPARE - Administrator, Civil Service (Medical): No    Lack of Transportation (Non-Medical): No  Physical Activity: Insufficiently Active (07/22/2023)   Exercise Vital Sign    Days of Exercise per Week: 3 days    Minutes of Exercise per Session: 20 min  Stress: Stress Concern Present (07/22/2023)   [employer] of Occupational Health -  Occupational Stress Questionnaire    Feeling of Stress: To some extent  Social Connections: Moderately Isolated (07/22/2023)   Social Connection and Isolation Panel    Frequency of Communication with Friends and Family: More than three times a week    Frequency of Social Gatherings with Friends and Family: Three times a week    Attends Religious Services: 1 to 4 times per year    Active Member of Clubs or Organizations: No    Attends Banker Meetings: Not on file    Marital Status: Widowed   CONSTITUTIONAL: Negative for chills, fatigue, fever, unintentional weight gain and unintentional weight loss.  E/N/T: Negative for ear pain, nasal congestion and sore throat.  CARDIOVASCULAR: Negative for chest pain, dizziness, palpitations and pedal edema.  RESPIRATORY: Negative for recent cough and dyspnea.  GASTROINTESTINAL: Negative for abdominal pain, acid reflux symptoms, constipation, diarrhea, nausea and vomiting.  MSK: Negative for arthralgias and myalgias.  INTEGUMENTARY: Negative for rash.  NEUROLOGICAL: Negative for dizziness and headaches.  PSYCHIATRIC: Negative for sleep disturbance and to question depression screen.  Negative for depression, negative  for anhedonia.       Objective:  PHYSICAL EXAM:   VS: BP 132/66   Pulse 71   Temp 97.6 F (36.4 C)   Ht 5' 4 (1.626 m)   Wt 164 lb 6.4 oz (74.6 kg)   SpO2 98%   BMI 28.22 kg/m   GEN: Well nourished, well developed, in no acute distress   Cardiac: RRR; no murmurs, rubs, or gallops,no edema - no significant varicosities Respiratory:  normal respiratory rate and pattern with no distress - normal breath sounds with no rales, rhonchi, wheezes or rubs GI: normal bowel sounds, no masses or tenderness MS: no deformity or atrophy  Skin: warm and dry, no rash  Neuro:  Alert and Oriented x 3, Strength and sensation are intact - CN II-Xii grossly intact Psych: euthymic mood, appropriate affect and demeanor   Lab Results  Component Value Date   WBC 11.9 (H) 03/22/2023   HGB 12.3 03/22/2023   HCT 36.5 03/22/2023   PLT 199 03/22/2023   GLUCOSE 86 04/04/2023   CHOL 161 03/22/2023   TRIG 226 (H) 03/22/2023   HDL 43 03/22/2023   LDLCALC 80 03/22/2023   ALT 15 03/22/2023   AST 18 03/22/2023   NA 134 04/04/2023   K 3.6 04/04/2023   CL 94 (L) 04/04/2023   CREATININE 0.88 04/04/2023   BUN 11 04/04/2023   CO2 24 04/04/2023   TSH 3.340 03/22/2023   HGBA1C 5.9 (H) 03/22/2023   MICROALBUR 30 03/19/2019      Assessment & Plan:   Problem List Items Addressed This Visit       Cardiovascular and Mediastinum   Hypertension associated with diabetes (HCC)   Relevant Orders   CBC with Differential/Platelet   Comprehensive metabolic panel Continue meds     Endocrine   Hypothyroidism, adult   Relevant Orders   TSH Continue med     Other   Hyperlipidemia associated with diabetes (HCC)   Relevant Orders   Lipid panel Watch diet - low fat/low chol Continue med   Major depressive disorder, recurrent episode, mild (HCC) Continue meds   Other Visit Diagnoses     Type 2 diabetes mellitus with hyperglycemia (HCC)       Relevant Medications   metFORMIN  (GLUCOPHAGE ) 500 MG  tablet   Other Relevant Orders   CBC with Differential/Platelet   Comprehensive metabolic panel  Hemoglobin A1c Low carb/low sugar diet Urine microalbumin    Vitamin D  insufficiency       Relevant Orders   VITAMIN D  25 Hydroxy (Vit-D Deficiency, Fractures)  Thrombocytopenia Cbc pending   Aortic atherosclerosis (HCC) Coronary artery calcification Recommend asa qd Follow up with cardiology as directed  Anxiety Continue celexa  and ativan as directed     .  No orders of the defined types were placed in this encounter.   Orders Placed This Encounter  Procedures   CBC with Differential/Platelet   Comprehensive metabolic panel with GFR   TSH   Lipid panel   Hemoglobin A1c   VITAMIN D  25 Hydroxy (Vit-D Deficiency, Fractures)   B12 and Folate Panel   Microalbumin/Creatinine Ratio, Urine     Follow-up: Return in about 4 months (around 11/23/2023) for chronic fasting follow-up.  An After Visit Summary was printed and given to the patient.  CAMIE JONELLE NICHOLAUS DEVONNA [facility] [phone]

## 2023-07-24 ENCOUNTER — Ambulatory Visit: Payer: Self-pay | Admitting: Physician Assistant

## 2023-07-24 ENCOUNTER — Other Ambulatory Visit: Payer: Self-pay | Admitting: Physician Assistant

## 2023-07-24 ENCOUNTER — Other Ambulatory Visit (HOSPITAL_BASED_OUTPATIENT_CLINIC_OR_DEPARTMENT_OTHER): Payer: Self-pay

## 2023-07-24 DIAGNOSIS — E1169 Type 2 diabetes mellitus with other specified complication: Secondary | ICD-10-CM

## 2023-07-24 LAB — CBC WITH DIFFERENTIAL/PLATELET
Basophils Absolute: 0.1 10*3/uL (ref 0.0–0.2)
Basos: 1 %
EOS (ABSOLUTE): 0.5 10*3/uL — ABNORMAL HIGH (ref 0.0–0.4)
Eos: 4 %
Hematocrit: 40 % (ref 34.0–46.6)
Hemoglobin: 13 g/dL (ref 11.1–15.9)
Immature Grans (Abs): 0.1 10*3/uL (ref 0.0–0.1)
Immature Granulocytes: 1 %
Lymphocytes Absolute: 2.1 10*3/uL (ref 0.7–3.1)
Lymphs: 19 %
MCH: 29 pg (ref 26.6–33.0)
MCHC: 32.5 g/dL (ref 31.5–35.7)
MCV: 89 fL (ref 79–97)
Monocytes Absolute: 1.2 10*3/uL — ABNORMAL HIGH (ref 0.1–0.9)
Monocytes: 11 %
Neutrophils Absolute: 7.1 10*3/uL — ABNORMAL HIGH (ref 1.4–7.0)
Neutrophils: 64 %
Platelets: 236 10*3/uL (ref 150–450)
RBC: 4.48 x10E6/uL (ref 3.77–5.28)
RDW: 13.2 % (ref 11.7–15.4)
WBC: 11.1 10*3/uL — ABNORMAL HIGH (ref 3.4–10.8)

## 2023-07-24 LAB — MICROALBUMIN / CREATININE URINE RATIO
Creatinine, Urine: 102.9 mg/dL
Microalb/Creat Ratio: 55 mg/g{creat} — ABNORMAL HIGH (ref 0–29)
Microalbumin, Urine: 56.8 ug/mL

## 2023-07-24 LAB — COMPREHENSIVE METABOLIC PANEL WITH GFR
ALT: 16 IU/L (ref 0–32)
AST: 16 IU/L (ref 0–40)
Albumin: 4.1 g/dL (ref 3.9–4.9)
Alkaline Phosphatase: 77 IU/L (ref 44–121)
BUN/Creatinine Ratio: 13 (ref 12–28)
BUN: 12 mg/dL (ref 8–27)
Bilirubin Total: 0.3 mg/dL (ref 0.0–1.2)
CO2: 23 mmol/L (ref 20–29)
Calcium: 9.3 mg/dL (ref 8.7–10.3)
Chloride: 94 mmol/L — ABNORMAL LOW (ref 96–106)
Creatinine, Ser: 0.91 mg/dL (ref 0.57–1.00)
Globulin, Total: 3.1 g/dL (ref 1.5–4.5)
Glucose: 100 mg/dL — ABNORMAL HIGH (ref 70–99)
Potassium: 4.1 mmol/L (ref 3.5–5.2)
Sodium: 133 mmol/L — ABNORMAL LOW (ref 134–144)
Total Protein: 7.2 g/dL (ref 6.0–8.5)
eGFR: 68 mL/min/{1.73_m2} (ref >59)

## 2023-07-24 LAB — LIPID PANEL
Chol/HDL Ratio: 4.5 ratio — ABNORMAL HIGH (ref 0.0–4.4)
Cholesterol, Total: 168 mg/dL (ref 100–199)
HDL: 37 mg/dL — ABNORMAL LOW (ref >39)
LDL Chol Calc (NIH): 84 mg/dL (ref 0–99)
Triglycerides: 286 mg/dL — ABNORMAL HIGH (ref 0–149)
VLDL Cholesterol Cal: 47 mg/dL — ABNORMAL HIGH (ref 5–40)

## 2023-07-24 LAB — TSH: TSH: 3.74 u[IU]/mL (ref 0.450–4.500)

## 2023-07-24 LAB — VITAMIN D 25 HYDROXY (VIT D DEFICIENCY, FRACTURES): Vit D, 25-Hydroxy: 40.1 ng/mL (ref 30.0–100.0)

## 2023-07-24 LAB — B12 AND FOLATE PANEL
Folate: 7.4 ng/mL (ref >3.0)
Vitamin B-12: 1283 pg/mL — ABNORMAL HIGH (ref 232–1245)

## 2023-07-24 LAB — HEMOGLOBIN A1C
Est. average glucose Bld gHb Est-mCnc: 134 mg/dL
Hgb A1c MFr Bld: 6.3 % — ABNORMAL HIGH (ref 4.8–5.6)

## 2023-07-24 MED ORDER — SIMVASTATIN 40 MG PO TABS
40.0000 mg | ORAL_TABLET | Freq: Every day | ORAL | 1 refills | Status: DC
  Filled 2023-07-24: qty 90, 90d supply, fill #0
  Filled 2023-11-04: qty 90, 90d supply, fill #1

## 2023-07-29 DIAGNOSIS — D1801 Hemangioma of skin and subcutaneous tissue: Secondary | ICD-10-CM | POA: Diagnosis not present

## 2023-07-29 DIAGNOSIS — L814 Other melanin hyperpigmentation: Secondary | ICD-10-CM | POA: Diagnosis not present

## 2023-07-29 DIAGNOSIS — D2272 Melanocytic nevi of left lower limb, including hip: Secondary | ICD-10-CM | POA: Diagnosis not present

## 2023-07-29 DIAGNOSIS — L821 Other seborrheic keratosis: Secondary | ICD-10-CM | POA: Diagnosis not present

## 2023-07-29 DIAGNOSIS — L570 Actinic keratosis: Secondary | ICD-10-CM | POA: Diagnosis not present

## 2023-07-29 DIAGNOSIS — Z85828 Personal history of other malignant neoplasm of skin: Secondary | ICD-10-CM | POA: Diagnosis not present

## 2023-08-01 ENCOUNTER — Ambulatory Visit: Attending: Vascular Surgery | Admitting: Vascular Surgery

## 2023-08-01 ENCOUNTER — Ambulatory Visit (HOSPITAL_COMMUNITY)
Admission: RE | Admit: 2023-08-01 | Discharge: 2023-08-01 | Disposition: A | Discharge status: Home / Self Care | Source: Ambulatory Visit | Attending: Vascular Surgery | Admitting: Vascular Surgery

## 2023-08-01 ENCOUNTER — Encounter: Payer: Self-pay | Admitting: Vascular Surgery

## 2023-08-01 VITALS — BP 140/76 | HR 55 | Temp 97.9°F | Resp 16 | Ht 64.0 in | Wt 164.9 lb

## 2023-08-01 DIAGNOSIS — E1169 Type 2 diabetes mellitus with other specified complication: Secondary | ICD-10-CM

## 2023-08-01 DIAGNOSIS — I2510 Atherosclerotic heart disease of native coronary artery without angina pectoris: Secondary | ICD-10-CM | POA: Diagnosis not present

## 2023-08-01 DIAGNOSIS — I739 Peripheral vascular disease, unspecified: Secondary | ICD-10-CM | POA: Diagnosis not present

## 2023-08-01 DIAGNOSIS — G629 Polyneuropathy, unspecified: Secondary | ICD-10-CM

## 2023-08-01 DIAGNOSIS — E785 Hyperlipidemia, unspecified: Secondary | ICD-10-CM | POA: Diagnosis not present

## 2023-08-01 LAB — VAS US ABI WITH/WO TBI
Left ABI: 1.05
Right ABI: 0.97

## 2023-08-01 NOTE — Progress Notes (Signed)
 Office Note    HPI: Cassandra Fox is a 68 y.o. (06/16/1955) female presenting in follow-up with mild PAD, and peripheral neuropathy.    A native of [place] or [place] , she continues to live there. She has 3 children in the region and multiple grandchildren.  Unfortunate, her husband passed away last 2024/01/01.  She is doing better, but still struggling at times with his loss.  She has a history of diabetes, and smokes on a daily basis.   She denies claudication, ischemic rest pain, tissue loss. Similar to last year, she is working to stay active, and is very involved with her grandchildren.  Her main goal is to live as long as possible for her grandkids.  The pt is  on a statin for cholesterol management.  The pt is  on a daily aspirin.   Other AC:  - The pt is  on medication for hypertension.   The pt is  diabetic.  Tobacco hx:  Current  Past Medical History:  Diagnosis Date   Abnormal laboratory test 05/16/2022   Acute laryngopharyngitis 05/16/2022   Anxiety    Aortic atherosclerosis (HCC) 11/19/2022   Benign hypertension 03/18/2019   BMI 26.0-26.9,adult 08/03/2020   Breast mass, right    Bursitis of right shoulder 11/14/2020   Coronary artery calcification 11/19/2022   Depression    Diabetes mellitus without complication (HCC)    Diverticulitis    Hyperlipidemia    Hyperlipidemia associated with type 2 diabetes mellitus (HCC) 11/19/2022   Hypertension    Hypothyroidism    Hypothyroidism, adult 03/18/2019   LLQ pain 01/10/2022   Major depressive disorder, recurrent episode, mild (HCC) 03/18/2019   Mixed hyperlipidemia 03/18/2019   Need for vaccination for pneumococcus 11/19/2022   Neuropathy    Peripheral vascular disease (HCC)    [facility] spotted fever) 07/10/2020   Thrombocytopenia (HCC) 11/19/2022   Type 2 diabetes mellitus with hyperlipidemia (HCC) 03/18/2019   Vitamin D  deficiency    Vitamin D  insufficiency 03/01/2022    Past Surgical  History:  Procedure Laterality Date   BREAST EXCISIONAL BIOPSY Right    benign   BREAST LUMPECTOMY WITH RADIOACTIVE SEED LOCALIZATION Right 09/23/2015   Procedure: RIGHT BREAST LUMPECTOMY WITH RADIOACTIVE SEED LOCALIZATION;  Surgeon: Morene Olives, MD;  Location: [facility];  Service: General;  Laterality: Right;   CHOLECYSTECTOMY     EYE SURGERY Left 10/15/2022    Social History   Socioeconomic History   Marital status: Widowed    Spouse name: Dempsey   Number of children: 3   Years of education: Not on file   Highest education level: Associate degree: occupational, Scientist, product/process development, or vocational program  Occupational History   Occupation: Retired Theme park manager  Tobacco Use   Smoking status: Every Day    Current packs/day: 0.50    Average packs/day: 0.5 packs/day for 40.0 years (20.0 ttl pk-yrs)    Types: Cigarettes   Smokeless tobacco: Never  Vaping Use   Vaping status: Never Used  Substance and Sexual Activity   Alcohol use: Never    Comment: social   Drug use: Never   Sexual activity: Not Currently  Other Topics Concern   Not on file  Social History Narrative   Not on file   Social Drivers of Health   Financial Resource Strain: Low Risk  (07/22/2023)   Overall Financial Resource Strain (CARDIA)    Difficulty of Paying Living Expenses: Not hard at all  Food Insecurity: No Food  Insecurity (07/22/2023)   Hunger Vital Sign    Worried About Running Out of Food in the Last Year: Never true    Ran Out of Food in the Last Year: Never true  Transportation Needs: No Transportation Needs (07/22/2023)   PRAPARE - Administrator, Civil Service (Medical): No    Lack of Transportation (Non-Medical): No  Physical Activity: Insufficiently Active (07/22/2023)   Exercise Vital Sign    Days of Exercise per Week: 3 days    Minutes of Exercise per Session: 20 min  Stress: Stress Concern Present (07/22/2023)   [employer] of Occupational Health -  Occupational Stress Questionnaire    Feeling of Stress: To some extent  Social Connections: Moderately Isolated (07/22/2023)   Social Connection and Isolation Panel    Frequency of Communication with Friends and Family: More than three times a week    Frequency of Social Gatherings with Friends and Family: Three times a week    Attends Religious Services: 1 to 4 times per year    Active Member of Clubs or Organizations: No    Attends Banker Meetings: Not on file    Marital Status: Widowed  Intimate Partner Violence: Not At Risk (04/15/2023)   Humiliation, Afraid, Rape, and Kick questionnaire    Fear of Current or Ex-Partner: No    Emotionally Abused: No    Physically Abused: No    Sexually Abused: No   Family History  Problem Relation Age of Onset   Hypertension Mother    Hyperlipidemia Mother    Diabetes Mother    Colon cancer Mother    Hyperlipidemia Father    Hypertension Father    Stroke Sister     Current Outpatient Medications  Medication Sig Dispense Refill   ALPRAZolam  (XANAX ) 0.5 MG tablet Take 1 tablet (0.5 mg total) by mouth 2 (two) times daily as needed for anxiety. 60 tablet 0   aspirin EC 81 MG tablet Take 81 mg by mouth daily.     citalopram  (CELEXA ) 40 MG tablet Take 1 tablet (40 mg total) by mouth daily. 90 tablet 1   clotrimazole  (LOTRIMIN ) 1 % cream apply to affected area(s) Externally Twice a day for 2 to 4 weeks 30 g 0   diphenhydrAMINE (BENADRYL) 50 MG capsule Take 50 mg by mouth at bedtime.     famotidine (PEPCID) 20 MG tablet Take 20 mg by mouth 2 (two) times daily.     gabapentin  (NEURONTIN ) 300 MG capsule Take 1 capsule (300 mg total) by mouth at bedtime. 30 capsule 1   glucose blood (ONETOUCH VERIO) test strip USE TO CHECK BLOOD GLUCOSE TWICE DAILY 100 strip 4   levothyroxine  (SYNTHROID ) 75 MCG tablet Take 1 tablet (75 mcg total) by mouth daily before breakfast. 90 tablet 1   losartan -hydrochlorothiazide  (HYZAAR ) 100-25 MG tablet Take 1  tablet by mouth daily. 90 tablet 2   metFORMIN  (GLUCOPHAGE ) 500 MG tablet Take 1 tablet (500 mg total) by mouth 2 (two) times daily. 180 tablet 2   metoprolol  tartrate (LOPRESSOR ) 25 MG tablet Take 1 tablet (25 mg total) by mouth daily. 90 tablet 1   Multiple Vitamins-Minerals (OCUVITE PRESERVISION PO) Take 1 tablet by mouth daily.     simvastatin  (ZOCOR ) 40 MG tablet Take 1 tablet (40 mg total) by mouth at bedtime. 90 tablet 1   Vitamin D , Cholecalciferol, 1000 units CAPS Take 1 tablet by mouth daily.     Zinc Sulfate (ZINC 15 PO) Take 1 tablet  by mouth daily.     cyanocobalamin  (VITAMIN B12) 1000 MCG tablet Take 1,000 mcg by mouth daily.     No current facility-administered medications for this visit.    Allergies  Allergen Reactions   Codeine Nausea Only and Other (See Comments)     REVIEW OF SYSTEMS:  [X]  denotes positive finding, [ ]  denotes negative finding Cardiac  Comments:  Chest pain or chest pressure:    Shortness of breath upon exertion:    Short of breath when lying flat:    Irregular heart rhythm:        Vascular    Pain in calf, thigh, or hip brought on by ambulation:    Pain in feet at night that wakes you up from your sleep:     Blood clot in your veins:    Leg swelling:         Pulmonary    Oxygen at home:    Productive cough:     Wheezing:         Neurologic    Sudden weakness in arms or legs:     Sudden numbness in arms or legs:     Sudden onset of difficulty speaking or slurred speech:    Temporary loss of vision in one eye:     Problems with dizziness:         Gastrointestinal    Blood in stool:     Vomited blood:         Genitourinary    Burning when urinating:     Blood in urine:        Psychiatric    Major depression:         Hematologic    Bleeding problems:    Problems with blood clotting too easily:        Skin    Rashes or ulcers:        Constitutional    Fever or chills:      PHYSICAL EXAMINATION:  Vitals:   08/01/23  1414  BP: (!) 140/76  Pulse: (!) 55  Resp: 16  Temp: 97.9 F (36.6 C)  TempSrc: Temporal  SpO2: 97%  Weight: 164 lb 14.4 oz (74.8 kg)  Height: 5' 4 (1.626 m)    General:  WDWN in NAD; vital signs documented above Gait: Not observed HENT: WNL, normocephalic Pulmonary: normal non-labored breathing , without wheezing Cardiac: regular HR Abdomen: soft, NT, no masses Skin: without rashes Vascular Exam/Pulses:  Right Left  Radial 2+ (normal) 2+ (normal)  Ulnar    Femoral    Popliteal    DP    PT 2+ (normal) 2+ (normal)   Extremities: without ischemic changes, without Gangrene , without cellulitis; without open wounds;  Musculoskeletal: no muscle wasting or atrophy  Neurologic: A&O X 3;  No focal weakness or paresthesias are detected Psychiatric:  The pt has Normal affect.   Non-Invasive Vascular Imaging:     ABI Findings:  +---------+------------------+-----+---------+----------------+  Right   Rt Pressure (mmHg)IndexWaveform Comment           +---------+------------------+-----+---------+----------------+  Brachial 176                                               +---------+------------------+-----+---------+----------------+  PTA     171               0.97 triphasic                  +---------+------------------+-----+---------+----------------+  DP      170               0.97 biphasic Audibly biphasic  +---------+------------------+-----+---------+----------------+  Great Toe91                0.52 Abnormal                   +---------+------------------+-----+---------+----------------+   +---------+------------------+-----+--------+----------------+  Left    Lt Pressure (mmHg)IndexWaveformComment           +---------+------------------+-----+--------+----------------+  Brachial 165                                              +---------+------------------+-----+--------+----------------+  PTA     184                1.05 biphasic                  +---------+------------------+-----+--------+----------------+  DP      175               0.99 biphasicAudibly biphasic  +---------+------------------+-----+--------+----------------+  [place] Toe82                0.47 Abnormal                  +---------+------------------+-----+--------+----------------+    ASSESSMENT/PLAN: MIGNON BECHLER is a 68 y.o. female presenting with mild PAD.  On physical exam, she had palpable pulses in the feet ABI was reviewed and demonstrated no changes from last year.  Toe pressures adequate for wound healing.    I had a long conversation with her regarding glycemic control as well as the need for smoking cessation.  She is aware that smoking with diabetes places her at a very high risk of future limb loss.    I offered my condolences regarding her husband, and told her that I will be praying for her.  I think it is reasonable for her to follow-up as needed at this time.  We discussed that I would like to see her back if new symptoms of claudication, ischemic rest pain or tissue loss develop.   Fonda FORBES Rim, MD [facility] [phone]

## 2023-08-12 ENCOUNTER — Other Ambulatory Visit (HOSPITAL_BASED_OUTPATIENT_CLINIC_OR_DEPARTMENT_OTHER): Payer: Self-pay

## 2023-08-13 ENCOUNTER — Other Ambulatory Visit: Payer: Self-pay | Admitting: Physician Assistant

## 2023-08-13 ENCOUNTER — Other Ambulatory Visit (HOSPITAL_BASED_OUTPATIENT_CLINIC_OR_DEPARTMENT_OTHER): Payer: Self-pay

## 2023-08-13 ENCOUNTER — Encounter: Payer: Self-pay | Admitting: Physician Assistant

## 2023-08-13 DIAGNOSIS — F330 Major depressive disorder, recurrent, mild: Secondary | ICD-10-CM

## 2023-08-13 MED ORDER — CARIPRAZINE HCL 1.5 MG PO CAPS: 1.5000 mg | ORAL_CAPSULE | Freq: Every day | ORAL | Status: DC

## 2023-08-13 NOTE — Telephone Encounter (Signed)
 Will start vraylar  1.5mg  qd in addition to other meds

## 2023-08-20 ENCOUNTER — Ambulatory Visit (INDEPENDENT_AMBULATORY_CARE_PROVIDER_SITE_OTHER)
Admission: RE | Admit: 2023-08-20 | Discharge: 2023-08-20 | Disposition: A | Discharge status: Home / Self Care | Source: Ambulatory Visit | Attending: Nurse Practitioner | Admitting: Nurse Practitioner

## 2023-08-20 DIAGNOSIS — Z780 Asymptomatic menopausal state: Secondary | ICD-10-CM | POA: Diagnosis not present

## 2023-08-20 DIAGNOSIS — M85851 Other specified disorders of bone density and structure, right thigh: Secondary | ICD-10-CM

## 2023-08-20 DIAGNOSIS — M8589 Other specified disorders of bone density and structure, multiple sites: Secondary | ICD-10-CM | POA: Diagnosis not present

## 2023-08-26 ENCOUNTER — Other Ambulatory Visit (HOSPITAL_BASED_OUTPATIENT_CLINIC_OR_DEPARTMENT_OTHER): Payer: Self-pay

## 2023-08-26 ENCOUNTER — Other Ambulatory Visit (HOSPITAL_COMMUNITY): Payer: Self-pay

## 2023-08-27 ENCOUNTER — Other Ambulatory Visit: Payer: Self-pay

## 2023-08-27 ENCOUNTER — Other Ambulatory Visit (HOSPITAL_BASED_OUTPATIENT_CLINIC_OR_DEPARTMENT_OTHER): Payer: Self-pay

## 2023-08-27 ENCOUNTER — Other Ambulatory Visit: Payer: Self-pay | Admitting: Physician Assistant

## 2023-08-27 DIAGNOSIS — K219 Gastro-esophageal reflux disease without esophagitis: Secondary | ICD-10-CM

## 2023-08-27 DIAGNOSIS — I739 Peripheral vascular disease, unspecified: Secondary | ICD-10-CM | POA: Insufficient documentation

## 2023-08-27 DIAGNOSIS — F330 Major depressive disorder, recurrent, mild: Secondary | ICD-10-CM

## 2023-08-27 MED ORDER — CLOTRIMAZOLE 1 % EX CREA
1.0000 | TOPICAL_CREAM | Freq: Two times a day (BID) | CUTANEOUS | 0 refills | Status: AC
  Filled 2023-08-27: qty 45, 23d supply, fill #0

## 2023-08-27 NOTE — Progress Notes (Signed)
 Cardiology Office Note   Date:  08/29/2023  ID:  Cassandra Fox, DOB July 14, 1955, MRN [id number] PCP: Nicholaus Credit, PA-C  [facility] Providers Cardiologist:  Redell Leiter, MD Cardiology APP:  Carlin Delon BROCKS, NP     History of Present Illness Cassandra Fox is a 68 y.o. female with a past medical history of coronary artery disease, apical hypertrophic cardiomyopathy, DM2, dyslipidemia.  08/01/2023 ABI abnormal  01/31/2023 monitor average heart rate 61 bpm, predominantly sinus rhythm, brief run of atrial tachycardia 01/03/2024 echo EF 60-65%, severe asymmetric LVH, mild MR 12/07/2022 coronary CTA calcium score of 11.4, 51st percentile, TPV moderate  She established care with Dr. Leiter in 2024 after having a CT scan revealed coronary artery calcification. A coronary CTA was arranged revealed a calcium score of 11.4, 51st percentile.  Most recently she was evaluated on 03/01/2023, her blood pressure was low and her amlodipine  was stopped and she was started on Lovaza , with plans to follow up in 6 months.   She presents today for follow-up.  She has been doing okay since she was last evaluated in our office from a cardiac perspective.  Unfortunately, she lost her husband around the end of last year and has been dealing with that loss.  She was recently started on a new medication by her PCP to help with her mood, she is feeling much better from this perspective however her blood pressure is slightly increased, which may be contributory to the new medication. She denies chest pain, palpitations, dyspnea, pnd, orthopnea, n, v, dizziness, syncope, edema, weight gain, or early satiety.    ROS: Review of Systems  Psychiatric/Behavioral:  Positive for depression (improving).   All other systems reviewed and are negative.    Studies Reviewed      Cardiac Studies & Procedures   ______________________________________________________________________________________________      ECHOCARDIOGRAM  ECHOCARDIOGRAM COMPLETE 01/03/2023  Narrative ECHOCARDIOGRAM REPORT    Patient Name:   Cassandra Fox Date of Exam: 01/03/2023 Medical Rec #:  [id number]         Height:       64.0 in Accession #:    [id number]        Weight:       160.0 lb Date of Birth:  02/03/1955         BSA:          1.779 m Patient Age:    67 years          BP:           112/68 mmHg Patient Gender: F                 HR:           54 bpm. Exam Location:  [place]  Procedure: 2D Echo, Cardiac Doppler, Color Doppler and Strain Analysis  Indications:    Coronary artery calcification [I25.10 (ICD-10-CM)]  History:        Patient has no prior history of Echocardiogram examinations. CAD, Abnormal ECG; Risk Factors:Hypertension, Diabetes and Dyslipidemia.  Sonographer:    Charlie Jointer RDCS Referring Phys: [id number] BRIAN J MUNLEY  IMPRESSIONS   1. Apical views consistent with apical hypertrophic cardiomyopathy 16-18 mm . Left ventricular ejection fraction, by estimation, is 60 to 65%. The left ventricle has normal function. The left ventricle has no regional wall motion abnormalities. There is severe asymmetric left ventricular hypertrophy. Left ventricular diastolic parameters were normal. 2. Right ventricular systolic function is normal. The right ventricular size  is normal. There is normal pulmonary artery systolic pressure. 3. The mitral valve is normal in structure. Mild mitral valve regurgitation. No evidence of mitral stenosis. 4. The aortic valve is tricuspid. Aortic valve regurgitation is not visualized. No aortic stenosis is present. 5. The inferior vena cava is normal in size with greater than 50% respiratory variability, suggesting right atrial pressure of 3 mmHg.  FINDINGS Left Ventricle: Apical views consistent with apical hypertrophic cardiomyopathy 16-18 mm. Left ventricular ejection fraction, by estimation, is 60 to 65%. The left ventricle has normal function. The left  ventricle has no regional wall motion abnormalities. The left ventricular internal cavity size was normal in size. There is severe asymmetric left ventricular hypertrophy. Left ventricular diastolic parameters were normal.  Right Ventricle: The right ventricular size is normal. No increase in right ventricular wall thickness. Right ventricular systolic function is normal. There is normal pulmonary artery systolic pressure. The tricuspid regurgitant velocity is 2.61 m/s, and with an assumed right atrial pressure of 3 mmHg, the estimated right ventricular systolic pressure is 30.2 mmHg.  Left Atrium: Left atrial size was normal in size.  Right Atrium: Right atrial size was normal in size.  Pericardium: There is no evidence of pericardial effusion.  Mitral Valve: The mitral valve is normal in structure. Mild mitral valve regurgitation. No evidence of mitral valve stenosis.  Tricuspid Valve: The tricuspid valve is normal in structure. Tricuspid valve regurgitation is mild . No evidence of tricuspid stenosis.  Aortic Valve: The aortic valve is tricuspid. Aortic valve regurgitation is not visualized. No aortic stenosis is present.  Pulmonic Valve: The pulmonic valve was normal in structure. Pulmonic valve regurgitation is not visualized. No evidence of pulmonic stenosis.  Aorta: The aortic root is normal in size and structure and the aortic root, ascending aorta, aortic arch and descending aorta are all structurally normal, with no evidence of dilitation or obstruction.  Venous: A normal flow pattern is recorded from the right upper pulmonary vein. The inferior vena cava is normal in size with greater than 50% respiratory variability, suggesting right atrial pressure of 3 mmHg.  IAS/Shunts: No atrial level shunt detected by color flow Doppler.   LEFT VENTRICLE PLAX 2D LVIDd:         5.40 cm   Diastology LVIDs:         3.00 cm   LV e' medial:    6.64 cm/s LV PW:         0.90 cm   LV E/e'  medial:  12.0 LV IVS:        1.10 cm   LV e' lateral:   7.72 cm/s LVOT diam:     1.90 cm   LV E/e' lateral: 10.3 LV SV:         78 LV SV Index:   44 LVOT Area:     2.84 cm   RIGHT VENTRICLE             IVC RV Basal diam:  3.20 cm     IVC diam: 1.90 cm RV Mid diam:    2.60 cm RV S prime:     14.10 cm/s TAPSE (M-mode): 2.8 cm  LEFT ATRIUM             Index        RIGHT ATRIUM           Index LA diam:        3.30 cm 1.85 cm/m   RA Area:  15.10 cm LA Vol (A2C):   49.1 ml 27.59 ml/m  RA Volume:   36.50 ml  20.51 ml/m LA Vol (A4C):   66.3 ml 37.26 ml/m LA Biplane Vol: 58.8 ml 33.05 ml/m AORTIC VALVE LVOT Vmax:   121.33 cm/s LVOT Vmean:  76.833 cm/s LVOT VTI:    0.276 m  AORTA Ao Root diam: 3.20 cm Ao Asc diam:  2.90 cm Ao Desc diam: 1.90 cm  MITRAL VALVE               TRICUSPID VALVE MV Area (PHT): 3.31 cm    TR Peak grad:   27.2 mmHg MV Decel Time: 229 msec    TR Vmax:        261.00 cm/s MR Peak grad: 84.6 mmHg MR Vmax:      460.00 cm/s  SHUNTS MV E velocity: 79.70 cm/s  Systemic VTI:  0.28 m MV A velocity: 62.40 cm/s  Systemic Diam: 1.90 cm MV E/A ratio:  1.28  Redell Leiter MD Electronically signed by Redell Leiter MD Signature Date/Time: 01/03/2023/3:21:10 PM    Final    MONITORS  LONG TERM MONITOR (3-14 DAYS) 01/29/2023  Narrative Patch Wear Time:  8 days and 1 hours (2024-12-26T15:31:15-0500 to 2025-01-03T16:38:42-0500)  Patient had a min HR of 48 bpm, max HR of 146 bpm, and avg HR of 61 bpm. Predominant underlying rhythm was Sinus Rhythm.  There was 1 diary event of fluttering with sinus rhythm.  There were no pauses of 3 seconds or greater no episodes of second or third-degree AV nodal block.  There were no episodes of atrial fibrillation or flutter.  Brief runs of APCs were seen longest 8 complexes representing atrial tachycardia.  There was a single 4 beat run of PVCs was seen at a relatively slow rate 112 bpm.  SVE Couplets were rare  (<1.0%), and SVE Triplets were rare (<1.0%).  Isolated VEs were rare (<1.0%), and no VE Couplets or VE Triplets were present.   CT SCANS  CT CORONARY MORPH W/CTA COR W/SCORE 12/17/2022  Addendum 01/01/2023  1:58 AM ADDENDUM REPORT: 01/01/2023 01:56  EXAM: OVER-READ INTERPRETATION  CT CHEST  The following report is an over-read performed by radiologist Dr. Oneil Devonshire of [facility] on 01/01/2023. This over-read does not include interpretation of cardiac or coronary anatomy or pathology. The coronary calcium score/coronary CTA interpretation by the cardiologist is attached.  COMPARISON:  None.  FINDINGS: Cardiovascular: There are no significant extracardiac vascular findings.  Mediastinum/Nodes: There are no enlarged lymph nodes within the visualized mediastinum.  Lungs/Pleura: There is no pleural effusion. The visualized lungs appear clear.  Upper abdomen: No significant findings in the visualized upper abdomen.  Musculoskeletal/Chest wall: No chest wall mass or suspicious osseous findings within the visualized chest.  IMPRESSION: No significant extracardiac findings within the visualized chest.   Electronically Signed By: Oneil Devonshire M.D. On: 01/01/2023 01:56  Narrative CLINICAL DATA:  59F with diabetes, hypertension, hyperlipidemia, coronary calcification and abnormal EKG.  EXAM: Cardiac/Coronary  CT  TECHNIQUE: The patient was scanned on a [employer].  FINDINGS: A 120 kV prospective scan was triggered in the descending thoracic aorta at 111 HU's. Axial non-contrast 3 mm slices were carried out through the heart. The data set was analyzed on a dedicated work station and scored using the Agatson method. Gantry rotation speed was 250 msecs and collimation was .6 mm. No beta blockade and 0.8 mg of sl NTG was given. The 3D data set was reconstructed  in 5% intervals of the 67-82 % of the R-R cycle. Diastolic phases were analyzed  on a dedicated work station using MPR, MIP and VRT modes. The patient received 80 cc of contrast.  Aorta: Normal size.  Aortic atherosclerosis.  No dissection.  Aortic Valve:  Trileaflet.  No calcifications.  Coronary Arteries:  Normal coronary origin.  Right dominance.  RCA is a large dominant artery that gives rise to PDA and PLVB. There is no plaque.  Left main is a large artery that gives rise to LAD and LCX arteries.  LAD is a large vessel that has minimal (<25%) calcified plaque proximally. D1 is a large vessel with minimal (<25%) calcified plaque proximally.  LCX is a non-dominant artery that gives rise to one large OM1 branch and a small OM2. There is minimal (<25%) calcified plaque in the proximal OM1.  Coronary Calcium Score:  Left main: 0  Left anterior descending artery: 11.2  Left circumflex artery: 0.156  Right coronary artery: 0  Total: 11.4  Percentile: 51st  Other findings:  Normal pulmonary vein drainage into the left atrium.  Normal let atrial appendage without a thrombus.  Normal size of the pulmonary artery.  The full cardiac cycle is not evaluated. However, there appears to be severe apical hypertrophy (1.8cm) consistent with apical variant hypertrophic cardiomyopathy. Suggest echo or cardiac MRI to better evaluate.  Non-cardiac: See separate report from [facility].  IMPRESSION: 1. Coronary calcium score of 11.4. This was 51st percentile for age-, sex, and race-matched controls.  2. Total plaque volume 147 mm3 which is 51st percentile for age- and sex-matched controls (calcified plaque 18 mm3; non-calcified plaque 129 mm3). TPV is moderate.  3. Normal coronary origin with right dominance.  4. There is minimal (<25%) mixed plaque in the proximal LAD, D1, and proximal LCX. CAD-RADS 1.  5. The full cardiac cycle is not evaluated. However, there appears to be severe apical hypertrophy (1.8cm) consistent with apical variant  hypertrophic cardiomyopathy. Suggest echo or cardiac MRI to better evaluate.  RECOMMENDATIONS: 1. CAD-RADS 0: No evidence of CAD (0%). Consider non-atherosclerotic causes of chest pain.  2. CAD-RADS 1: Minimal non-obstructive CAD (0-24%). Consider non-atherosclerotic causes of chest pain. Consider preventive therapy and risk factor modification.  3. CAD-RADS 2: Mild non-obstructive CAD (25-49%). Consider non-atherosclerotic causes of chest pain. Consider preventive therapy and risk factor modification.  4. CAD-RADS 3: Moderate stenosis. Consider symptom-guided anti-ischemic pharmacotherapy as well as risk factor modification per guideline directed care. Additional analysis with CT FFR will be submitted.  5. CAD-RADS 4: Severe stenosis. (70-99% or > 50% left main). Cardiac catheterization or CT FFR is recommended. Consider symptom-guided anti-ischemic pharmacotherapy as well as risk factor modification per guideline directed care. Invasive coronary angiography recommended with revascularization per published guideline statements.  6. CAD-RADS 5: Total coronary occlusion (100%). Consider cardiac catheterization or viability assessment. Consider symptom-guided anti-ischemic pharmacotherapy as well as risk factor modification per guideline directed care.  7. CAD-RADS N: Non-diagnostic study. Obstructive CAD can't be excluded. Alternative evaluation is recommended.  Annabella Scarce, MD  Electronically Signed: By: Annabella Scarce M.D. On: 12/18/2022 11:41     ______________________________________________________________________________________________      Risk Assessment/Calculations          Physical Exam VS:  BP (!) 148/64   Pulse 62   Ht 5' 4 (1.626 m)   Wt 165 lb (74.8 kg)   SpO2 98%   BMI 28.32 kg/m        Wt Readings from Last  3 Encounters:  08/28/23 165 lb (74.8 kg)  08/01/23 164 lb 14.4 oz (74.8 kg)  07/23/23 164 lb 6.4 oz (74.6 kg)    GEN: Well  nourished, well developed in no acute distress NECK: No JVD; No carotid bruits CARDIAC: RRR, no murmurs, rubs, gallops RESPIRATORY:  Clear to auscultation without rales, wheezing or rhonchi  ABDOMEN: Soft, non-tender, non-distended EXTREMITIES:  No edema; No deformity   ASSESSMENT AND PLAN CAD - non-obstructive per cCTA, Stable with no anginal symptoms. No indication for ischemic evaluation.  Continue aspirin 81 mg daily, Metoprolol  25 mg daily. Heart healthy diet and regular cardiovascular exercise encouraged.    Apical hypertrophy cardiomyopathy - NYHA class I, asymptomatic.   Hypertension -blood pressure slightly elevated today, this may be due to the addition of Vraylar  to her antidepressant regimen which is actually working quite well for her and we definitely want to continue her on this to help with her mood.  I am going to have her keep her blood pressure log x 1 week, for now we will continue her Hyzaar  100-25 mg daily.  She was previously on amlodipine  10 mg daily and this is caused pedal edema however we could start her on a lower dose of amlodipine  if her blood pressure log is not well-controlled.  She also follows very closely with her PCP.  Dyslipidemia-recent LDL slightly elevated at 84, she is currently on Zocor  40 mg--this was recently increased by her PCP and will be rechecked at her next office visit so anticipate this will be well-controlled at less than 70.        Dispo: Blood Pressure log for 1 week, if it is not well-controlled we will start her on a low-dose of amlodipine .  Otherwise follow-up in 1 year with cardiology  Signed, Delon JAYSON Hoover, NP

## 2023-08-28 ENCOUNTER — Ambulatory Visit: Attending: Cardiology | Admitting: Cardiology

## 2023-08-28 ENCOUNTER — Other Ambulatory Visit: Payer: Self-pay | Admitting: Physician Assistant

## 2023-08-28 ENCOUNTER — Encounter: Payer: Self-pay | Admitting: Cardiology

## 2023-08-28 ENCOUNTER — Other Ambulatory Visit (HOSPITAL_BASED_OUTPATIENT_CLINIC_OR_DEPARTMENT_OTHER): Payer: Self-pay

## 2023-08-28 ENCOUNTER — Other Ambulatory Visit: Payer: Self-pay

## 2023-08-28 VITALS — BP 148/64 | HR 62 | Ht 64.0 in | Wt 165.0 lb

## 2023-08-28 DIAGNOSIS — E782 Mixed hyperlipidemia: Secondary | ICD-10-CM | POA: Diagnosis not present

## 2023-08-28 DIAGNOSIS — I422 Other hypertrophic cardiomyopathy: Secondary | ICD-10-CM

## 2023-08-28 DIAGNOSIS — F330 Major depressive disorder, recurrent, mild: Secondary | ICD-10-CM

## 2023-08-28 DIAGNOSIS — I2510 Atherosclerotic heart disease of native coronary artery without angina pectoris: Secondary | ICD-10-CM

## 2023-08-28 DIAGNOSIS — I10 Essential (primary) hypertension: Secondary | ICD-10-CM | POA: Diagnosis not present

## 2023-08-28 MED ORDER — CARIPRAZINE HCL 1.5 MG PO CAPS
1.5000 mg | ORAL_CAPSULE | Freq: Every day | ORAL | 1 refills | Status: DC
  Filled 2023-08-28 – 2023-08-30 (×2): qty 30, 30d supply, fill #0

## 2023-08-28 MED ORDER — OMEPRAZOLE 40 MG PO CPDR
40.0000 mg | DELAYED_RELEASE_CAPSULE | Freq: Every day | ORAL | 3 refills | Status: DC
  Filled 2023-08-28: qty 30, 30d supply, fill #0
  Filled 2023-09-23: qty 30, 30d supply, fill #1
  Filled 2023-10-22: qty 20, 20d supply, fill #2
  Filled 2023-10-22: qty 10, 10d supply, fill #2
  Filled 2023-11-19: qty 30, 30d supply, fill #3

## 2023-08-28 NOTE — Patient Instructions (Signed)
 Medication Instructions:  Your physician recommends that you continue on your current medications as directed. Please refer to the Current Medication list given to you today.  *If you need a refill on your cardiac medications before your next appointment, please call your pharmacy*  Lab Work: None If you have labs (blood work) drawn today and your tests are completely normal, you will receive your results only by: MyChart Message (if you have MyChart) OR A paper copy in the mail If you have any lab test that is abnormal or we need to change your treatment, we will call you to review the results.  Testing/Procedures: None  Follow-Up: At [facility], you and your health needs are our priority.  As part of our continuing mission to provide you with exceptional heart care, our providers are all part of one team.  This team includes your primary Cardiologist (physician) and Advanced Practice Providers or APPs (Physician Assistants and Nurse Practitioners) who all work together to provide you with the care you need, when you need it.  Your next appointment:   1 year(s)  Provider:   Redell Leiter, MD    We recommend signing up for the patient portal called MyChart.  Sign up information is provided on this After Visit Summary.  MyChart is used to connect with patients for Virtual Visits (Telemedicine).  Patients are able to view lab/test results, encounter notes, upcoming appointments, etc.  Non-urgent messages can be sent to your provider as well.   To learn more about what you can do with MyChart, go to [email or web address].   Other Instructions Stop Benadryl and take Melatonin 1 mg at bed time  Please keep a BP log for 1 week and send by MyChart or mail.                          Name and DOB__________________________ Cassandra Hoover, NP [address] [place], [place] [address]  Blood Pressure Record Sheet To take your blood pressure, you will need a blood pressure machine. You  can buy a blood pressure machine (blood pressure monitor) at your clinic, drug store, or online. When choosing one, consider: An automatic monitor that has an arm cuff. A cuff that wraps snugly around your upper arm. You should be able to fit only one finger between your arm and the cuff. A device that stores blood pressure reading results. Do not choose a monitor that measures your blood pressure from your wrist or finger. Follow your health care provider's instructions for how to take your blood pressure. To use this form: Get one reading in the morning (a.m.) 1-2 hours after you take any medicines. Get one reading in the evening (p.m.) before supper.   Blood pressure log Date: _______________________  a.m. _____________________(1st reading) HR___________            p.m. _____________________(2nd reading) HR__________  Date: _______________________  a.m. _____________________(1st reading) HR___________            p.m. _____________________(2nd reading) HR__________  Date: _______________________  a.m. _____________________(1st reading) HR___________            p.m. _____________________(2nd reading) HR__________  Date: _______________________  a.m. _____________________(1st reading) HR___________            p.m. _____________________(2nd reading) HR__________  Date: _______________________  a.m. _____________________(1st reading) HR___________            p.m. _____________________(2nd reading) HR__________  Date: _______________________  a.m. _____________________(1st reading) HR___________  p.m. _____________________(2nd reading) HR__________  Date: _______________________  a.m. _____________________(1st reading) HR___________            p.m. _____________________(2nd reading) HR__________   This information is not intended to replace advice given to you by your health care provider. Make sure you discuss any questions you have with your health care  provider. Document Revised: 04/29/2019 Document Reviewed: 04/29/2019 Elsevier Patient Education  2021 [employer].

## 2023-08-30 ENCOUNTER — Other Ambulatory Visit (HOSPITAL_BASED_OUTPATIENT_CLINIC_OR_DEPARTMENT_OTHER): Payer: Self-pay

## 2023-09-04 ENCOUNTER — Telehealth: Payer: Self-pay | Admitting: Physician Assistant

## 2023-09-04 ENCOUNTER — Ambulatory Visit: Admitting: Physician Assistant

## 2023-09-04 NOTE — Telephone Encounter (Signed)
 Patient dropped off forms to be completed. Forms placed in Kim's office.

## 2023-09-08 ENCOUNTER — Other Ambulatory Visit: Payer: Self-pay | Admitting: Physician Assistant

## 2023-09-08 DIAGNOSIS — F419 Anxiety disorder, unspecified: Secondary | ICD-10-CM

## 2023-09-09 ENCOUNTER — Other Ambulatory Visit (HOSPITAL_BASED_OUTPATIENT_CLINIC_OR_DEPARTMENT_OTHER): Payer: Self-pay

## 2023-09-09 MED ORDER — ALPRAZOLAM 0.5 MG PO TABS
0.5000 mg | ORAL_TABLET | Freq: Two times a day (BID) | ORAL | 0 refills | Status: DC | PRN
  Filled 2023-09-09: qty 60, 30d supply, fill #0

## 2023-09-09 MED ORDER — GABAPENTIN 300 MG PO CAPS
300.0000 mg | ORAL_CAPSULE | Freq: Every day | ORAL | 1 refills | Status: DC
  Filled 2023-09-09: qty 30, 30d supply, fill #0
  Filled 2023-10-09: qty 30, 30d supply, fill #1

## 2023-09-10 ENCOUNTER — Other Ambulatory Visit (HOSPITAL_BASED_OUTPATIENT_CLINIC_OR_DEPARTMENT_OTHER): Payer: Self-pay

## 2023-09-10 MED ORDER — AMLODIPINE BESYLATE 2.5 MG PO TABS
2.5000 mg | ORAL_TABLET | Freq: Every day | ORAL | 3 refills | Status: DC
  Filled 2023-09-10: qty 90, 90d supply, fill #0

## 2023-09-11 ENCOUNTER — Other Ambulatory Visit: Payer: Self-pay | Admitting: Physician Assistant

## 2023-09-11 ENCOUNTER — Other Ambulatory Visit (HOSPITAL_BASED_OUTPATIENT_CLINIC_OR_DEPARTMENT_OTHER): Payer: Self-pay

## 2023-09-11 MED ORDER — LOSARTAN POTASSIUM-HCTZ 100-25 MG PO TABS
1.0000 | ORAL_TABLET | Freq: Every day | ORAL | 0 refills | Status: DC
  Filled 2023-09-11: qty 90, 90d supply, fill #0

## 2023-09-12 ENCOUNTER — Other Ambulatory Visit (HOSPITAL_BASED_OUTPATIENT_CLINIC_OR_DEPARTMENT_OTHER): Payer: Self-pay

## 2023-09-22 MED ORDER — NITROFURANTOIN MONOHYD MACRO 100 MG PO CAPS: 100.0000 mg | ORAL_CAPSULE | Freq: Two times a day (BID) | ORAL | 0 refills | Status: AC

## 2023-09-24 ENCOUNTER — Other Ambulatory Visit (HOSPITAL_BASED_OUTPATIENT_CLINIC_OR_DEPARTMENT_OTHER): Payer: Self-pay

## 2023-09-24 ENCOUNTER — Ambulatory Visit: Admitting: Physician Assistant

## 2023-09-27 ENCOUNTER — Ambulatory Visit: Admitting: Physician Assistant

## 2023-10-03 ENCOUNTER — Encounter: Payer: Self-pay | Admitting: Physician Assistant

## 2023-10-09 ENCOUNTER — Other Ambulatory Visit (HOSPITAL_BASED_OUTPATIENT_CLINIC_OR_DEPARTMENT_OTHER): Payer: Self-pay

## 2023-10-22 ENCOUNTER — Other Ambulatory Visit (HOSPITAL_BASED_OUTPATIENT_CLINIC_OR_DEPARTMENT_OTHER): Payer: Self-pay

## 2023-10-22 DIAGNOSIS — H2512 Age-related nuclear cataract, left eye: Secondary | ICD-10-CM | POA: Diagnosis not present

## 2023-10-22 DIAGNOSIS — Z961 Presence of intraocular lens: Secondary | ICD-10-CM | POA: Diagnosis not present

## 2023-10-22 DIAGNOSIS — H18413 Arcus senilis, bilateral: Secondary | ICD-10-CM | POA: Diagnosis not present

## 2023-10-22 DIAGNOSIS — H353131 Nonexudative age-related macular degeneration, bilateral, early dry stage: Secondary | ICD-10-CM | POA: Diagnosis not present

## 2023-10-22 MED ORDER — KETOROLAC TROMETHAMINE 0.5 % OP SOLN
1.0000 [drp] | Freq: Two times a day (BID) | OPHTHALMIC | 1 refills | Status: AC
  Filled 2023-10-22: qty 5, 25d supply, fill #0
  Filled 2023-11-04: qty 10, 100d supply, fill #0

## 2023-10-22 MED ORDER — GATIFLOXACIN 0.5 % OP SOLN
1.0000 [drp] | Freq: Four times a day (QID) | OPHTHALMIC | 1 refills | Status: AC
  Filled 2023-10-22 – 2023-11-04 (×2): qty 5, 25d supply, fill #0

## 2023-10-22 MED ORDER — PREDNISOLONE ACETATE 1 % OP SUSP
1.0000 [drp] | Freq: Four times a day (QID) | OPHTHALMIC | 1 refills | Status: DC
  Filled 2023-10-22 – 2023-11-04 (×2): qty 5, 25d supply, fill #0

## 2023-10-23 ENCOUNTER — Other Ambulatory Visit (HOSPITAL_BASED_OUTPATIENT_CLINIC_OR_DEPARTMENT_OTHER): Payer: Self-pay

## 2023-10-24 ENCOUNTER — Other Ambulatory Visit (HOSPITAL_BASED_OUTPATIENT_CLINIC_OR_DEPARTMENT_OTHER): Payer: Self-pay

## 2023-10-31 ENCOUNTER — Telehealth: Payer: Self-pay | Admitting: Cardiology

## 2023-10-31 NOTE — Telephone Encounter (Signed)
 Pt received a message card morphology in mychart and is requesting a callback for more info. Please advise.

## 2023-10-31 NOTE — Telephone Encounter (Signed)
 Left message for the patient to call back.

## 2023-11-04 ENCOUNTER — Other Ambulatory Visit (HOSPITAL_BASED_OUTPATIENT_CLINIC_OR_DEPARTMENT_OTHER): Payer: Self-pay

## 2023-11-04 ENCOUNTER — Other Ambulatory Visit: Payer: Self-pay | Admitting: Physician Assistant

## 2023-11-04 ENCOUNTER — Other Ambulatory Visit: Payer: Self-pay | Admitting: Family Medicine

## 2023-11-04 DIAGNOSIS — F419 Anxiety disorder, unspecified: Secondary | ICD-10-CM

## 2023-11-04 DIAGNOSIS — F330 Major depressive disorder, recurrent, mild: Secondary | ICD-10-CM

## 2023-11-04 MED ORDER — ALPRAZOLAM 0.5 MG PO TABS
0.5000 mg | ORAL_TABLET | Freq: Two times a day (BID) | ORAL | 0 refills | Status: DC | PRN
  Filled 2023-11-04: qty 60, 30d supply, fill #0

## 2023-11-04 MED ORDER — CITALOPRAM HYDROBROMIDE 40 MG PO TABS
40.0000 mg | ORAL_TABLET | Freq: Every day | ORAL | 1 refills | Status: DC
  Filled 2023-11-04: qty 90, 90d supply, fill #0

## 2023-11-04 MED ORDER — LEVOTHYROXINE SODIUM 75 MCG PO TABS
75.0000 ug | ORAL_TABLET | Freq: Every day | ORAL | 1 refills | Status: DC
  Filled 2023-11-04: qty 90, 90d supply, fill #0

## 2023-11-04 MED ORDER — GABAPENTIN 300 MG PO CAPS
300.0000 mg | ORAL_CAPSULE | Freq: Every day | ORAL | 1 refills | Status: DC
  Filled 2023-11-04: qty 30, 30d supply, fill #0
  Filled 2023-12-04: qty 30, 30d supply, fill #1

## 2023-11-04 NOTE — Telephone Encounter (Signed)
 Left vm to return call.

## 2023-11-04 NOTE — Telephone Encounter (Signed)
 Left voice message to return call

## 2023-11-05 NOTE — Telephone Encounter (Signed)
Completed in My Chart 

## 2023-11-06 ENCOUNTER — Encounter: Payer: Self-pay | Admitting: Physician Assistant

## 2023-11-07 ENCOUNTER — Ambulatory Visit (INDEPENDENT_AMBULATORY_CARE_PROVIDER_SITE_OTHER): Admitting: Physician Assistant

## 2023-11-07 ENCOUNTER — Encounter: Payer: Self-pay | Admitting: Physician Assistant

## 2023-11-07 ENCOUNTER — Other Ambulatory Visit (HOSPITAL_BASED_OUTPATIENT_CLINIC_OR_DEPARTMENT_OTHER): Payer: Self-pay

## 2023-11-07 VITALS — BP 130/60 | HR 60 | Temp 98.3°F | Ht 64.0 in | Wt 167.0 lb

## 2023-11-07 DIAGNOSIS — F419 Anxiety disorder, unspecified: Secondary | ICD-10-CM | POA: Diagnosis not present

## 2023-11-07 DIAGNOSIS — F330 Major depressive disorder, recurrent, mild: Secondary | ICD-10-CM

## 2023-11-07 MED ORDER — BUSPIRONE HCL 5 MG PO TABS
5.0000 mg | ORAL_TABLET | Freq: Two times a day (BID) | ORAL | 1 refills | Status: DC
  Filled 2023-11-07: qty 60, 30d supply, fill #0
  Filled 2023-12-02: qty 60, 30d supply, fill #1

## 2023-11-07 MED ORDER — FLUZONE HIGH-DOSE 0.5 ML IM SUSY
0.5000 mL | PREFILLED_SYRINGE | Freq: Once | INTRAMUSCULAR | 0 refills | Status: AC
  Filled 2023-11-07: qty 0.5, 1d supply, fill #0

## 2023-11-07 NOTE — Progress Notes (Signed)
 Subjective:  Patient ID: Cassandra Fox, female    DOB: [birth year]  Age: 68 y.o. MRN: [id number]  Chief Complaint  Patient presents with   Depression    HPI Pt states that she took vraylar  with celexa  for a few months - at first it was working well for her but then started not sleeping well and felt a 'high then crash' - she stopped medication and those symptoms improved She however feels that her anxiety and depression seem a little worse - it is coming up on the one year anniversary of husbands death She would like to continue with celexa      11/07/2023    1:51 PM 07/23/2023   10:46 AM 07/23/2023   10:30 AM 04/15/2023    3:38 PM 03/22/2023   10:16 AM  Depression screen PHQ 2/9  Decreased Interest 3 1 1 1 1   Down, Depressed, Hopeless 3 1 1 1 1   PHQ - 2 Score 6 2 2 2 2   Altered sleeping 3 1  1 1   Tired, decreased energy 2 0 0 1 1  Change in appetite 0 0 0 0 0  Feeling bad or failure about yourself  0 0 0 0 0  Trouble concentrating 0 0 0 0 0  Moving slowly or fidgety/restless 0 0 0 0 0  Suicidal thoughts 0 0 0 0 0  PHQ-9 Score 11 3  4 4   Difficult doing work/chores Somewhat difficult Not difficult at all Not difficult at all Somewhat difficult Somewhat difficult        11/19/2022    9:57 AM 03/22/2023   10:16 AM 04/15/2023    2:57 PM 07/23/2023   10:30 AM 11/07/2023    1:51 PM  Fall Risk  Falls in the past year? 0 1 1 1  0  Was there an injury with Fall? 0 0 0 0 0  Fall Risk Category Calculator 0 1 1 1  0  Patient at Risk for Falls Due to No Fall Risks No Fall Risks No Fall Risks History of fall(s) No Fall Risks  Fall risk Follow up Falls evaluation completed Falls evaluation completed Falls evaluation completed;Education provided  Falls evaluation completed     ROS CONSTITUTIONAL: Negative for chills, fatigue, fever,  CARDIOVASCULAR: Negative for chest pain, dizziness, palpitations and pedal edema.  RESPIRATORY: Negative for recent cough and dyspnea.  PSYCHIATRIC: see  HPI   Current Outpatient Medications:    ALPRAZolam  (XANAX ) 0.5 MG tablet, Take 1 tablet (0.5 mg total) by mouth 2 (two) times daily as needed for anxiety., Disp: 60 tablet, Rfl: 0   aspirin EC 81 MG tablet, Take 81 mg by mouth daily., Disp: , Rfl:    busPIRone (BUSPAR) 5 MG tablet, Take 1 tablet (5 mg total) by mouth 2 (two) times daily., Disp: 60 tablet, Rfl: 1   citalopram  (CELEXA ) 40 MG tablet, Take 1 tablet (40 mg total) by mouth daily., Disp: 90 tablet, Rfl: 1   famotidine (PEPCID) 20 MG tablet, Take 20 mg by mouth 2 (two) times daily., Disp: , Rfl:    gabapentin  (NEURONTIN ) 300 MG capsule, Take 1 capsule (300 mg total) by mouth at bedtime., Disp: 30 capsule, Rfl: 1   gatifloxacin  (ZYMAXID ) 0.5 % SOLN, Place 1 drop into the left eye 4 (four) times daily as directed. Store upside down!, Disp: 5 mL, Rfl: 1   glucose blood (ONETOUCH VERIO) test strip, USE TO CHECK BLOOD GLUCOSE TWICE DAILY, Disp: 100 strip, Rfl: 4   ketorolac  (ACULAR ) 0.5 %  ophthalmic solution, Place 1 drop into the left eye 2 (two) times daily as directed, Disp: 10 mL, Rfl: 1   levothyroxine  (SYNTHROID ) 75 MCG tablet, Take 1 tablet (75 mcg total) by mouth daily before breakfast., Disp: 90 tablet, Rfl: 1   losartan -hydrochlorothiazide  (HYZAAR ) 100-25 MG tablet, Take 1 tablet by mouth daily., Disp: 90 tablet, Rfl: 0   metFORMIN  (GLUCOPHAGE ) 500 MG tablet, Take 1 tablet (500 mg total) by mouth 2 (two) times daily., Disp: 180 tablet, Rfl: 2   metoprolol  tartrate (LOPRESSOR ) 25 MG tablet, Take 1 tablet (25 mg total) by mouth daily., Disp: 90 tablet, Rfl: 1   Multiple Vitamins-Minerals (OCUVITE PRESERVISION PO), Take 1 tablet by mouth daily., Disp: , Rfl:    Omega-3 Fatty Acids (FISH OIL) 1000 MG CAPS, Take 1,000 mg by mouth 2 (two) times daily., Disp: , Rfl:    omeprazole  (PRILOSEC) 40 MG capsule, Take 1 capsule (40 mg total) by mouth daily., Disp: 30 capsule, Rfl: 3   prednisoLONE  acetate (PRED FORTE ) 1 % ophthalmic suspension,  Place 1 drop into the left eye 4 (four) times daily as directed. Please shake before  each use!!!, Disp: 5 mL, Rfl: 1   simvastatin  (ZOCOR ) 40 MG tablet, Take 1 tablet (40 mg total) by mouth at bedtime., Disp: 90 tablet, Rfl: 1   Vitamin D , Cholecalciferol, 1000 units CAPS, Take 1 tablet by mouth daily., Disp: , Rfl:    Zinc Sulfate (ZINC 15 PO), Take 1 tablet by mouth daily. (Patient not taking: Reported on 11/07/2023), Disp: , Rfl:   Past Medical History:  Diagnosis Date   Abnormal laboratory test 05/16/2022   Anxiety    Aortic atherosclerosis 11/19/2022   Benign hypertension 03/18/2019   BMI 26.0-26.9,adult 08/03/2020   Breast mass, right    Bursitis of right shoulder 11/14/2020   Coronary artery calcification 11/19/2022   Depression    Diabetes mellitus without complication (HCC)    Hyperlipidemia    Hyperlipidemia associated with type 2 diabetes mellitus (HCC) 11/19/2022   Hypertension    Hypothyroidism    Hypothyroidism, adult 03/18/2019   LLQ pain 01/10/2022   Major depressive disorder, recurrent episode, mild 03/18/2019   Mixed hyperlipidemia 03/18/2019   Need for vaccination for pneumococcus 11/19/2022   Neuropathy    Osteopenia of neck of right femur 07/23/2023   Peripheral vascular disease    R[facility] spotted fever) 07/10/2020   Thrombocytopenia 11/19/2022   Type 2 diabetes mellitus with hyperlipidemia (HCC) 03/18/2019   Vitamin D  deficiency    Vitamin D  insufficiency 03/01/2022   Objective:  PHYSICAL EXAM:   BP 130/60   Pulse 60   Temp 98.3 F (36.8 C)   Ht 5' 4 (1.626 m)   Wt 167 lb (75.8 kg)   SpO2 98%   BMI 28.67 kg/m    GEN: Well nourished, well developed, in no acute distress  Cardiac: RRR; no murmurs,  Respiratory:  normal respiratory rate and pattern with no distress - normal breath sounds with no rales, rhonchi, wheezes or rubs Psych: euthymic mood, appropriate affect and demeanor  Assessment & Plan:    Anxiety -     busPIRone  HCl; Take 1 tablet (5 mg total) by mouth 2 (two) times daily.  Dispense: 60 tablet; Refill: 1  Major depressive disorder, recurrent episode, mild -     busPIRone HCl; Take 1 tablet (5 mg total) by mouth 2 (two) times daily.  Dispense: 60 tablet; Refill: 1 Continue celexa  as directed  Follow-up: Return for next month for chronic fasting visit.  An After Visit Summary was printed and given to the patient.  CAMIE JONELLE NICHOLAUS DEVONNA [facility] [phone]

## 2023-11-08 ENCOUNTER — Encounter: Payer: Self-pay | Admitting: Physician Assistant

## 2023-11-18 ENCOUNTER — Other Ambulatory Visit (HOSPITAL_BASED_OUTPATIENT_CLINIC_OR_DEPARTMENT_OTHER): Payer: Self-pay

## 2023-11-18 ENCOUNTER — Encounter (HOSPITAL_BASED_OUTPATIENT_CLINIC_OR_DEPARTMENT_OTHER): Payer: Self-pay

## 2023-11-19 ENCOUNTER — Other Ambulatory Visit: Payer: Self-pay | Admitting: Physician Assistant

## 2023-11-19 ENCOUNTER — Other Ambulatory Visit (HOSPITAL_BASED_OUTPATIENT_CLINIC_OR_DEPARTMENT_OTHER): Payer: Self-pay

## 2023-11-20 ENCOUNTER — Other Ambulatory Visit (HOSPITAL_BASED_OUTPATIENT_CLINIC_OR_DEPARTMENT_OTHER): Payer: Self-pay

## 2023-11-20 MED ORDER — METOPROLOL TARTRATE 25 MG PO TABS
25.0000 mg | ORAL_TABLET | Freq: Every day | ORAL | 1 refills | Status: DC
  Filled 2023-11-20: qty 90, 90d supply, fill #0

## 2023-11-27 ENCOUNTER — Ambulatory Visit: Admitting: Physician Assistant

## 2023-12-02 ENCOUNTER — Other Ambulatory Visit: Payer: Self-pay | Admitting: Physician Assistant

## 2023-12-02 ENCOUNTER — Other Ambulatory Visit (HOSPITAL_BASED_OUTPATIENT_CLINIC_OR_DEPARTMENT_OTHER): Payer: Self-pay

## 2023-12-02 DIAGNOSIS — F419 Anxiety disorder, unspecified: Secondary | ICD-10-CM

## 2023-12-02 MED ORDER — ALPRAZOLAM 0.5 MG PO TABS
0.5000 mg | ORAL_TABLET | Freq: Two times a day (BID) | ORAL | 0 refills | Status: DC | PRN
  Filled 2023-12-02: qty 60, 30d supply, fill #0

## 2023-12-02 MED ORDER — LOSARTAN POTASSIUM-HCTZ 100-25 MG PO TABS
1.0000 | ORAL_TABLET | Freq: Every day | ORAL | 0 refills | Status: AC
  Filled 2023-12-02: qty 90, 90d supply, fill #0

## 2023-12-04 ENCOUNTER — Other Ambulatory Visit (HOSPITAL_BASED_OUTPATIENT_CLINIC_OR_DEPARTMENT_OTHER): Payer: Self-pay

## 2023-12-10 ENCOUNTER — Encounter: Payer: Self-pay | Admitting: Physician Assistant

## 2023-12-10 ENCOUNTER — Other Ambulatory Visit (HOSPITAL_BASED_OUTPATIENT_CLINIC_OR_DEPARTMENT_OTHER): Payer: Self-pay

## 2023-12-10 ENCOUNTER — Ambulatory Visit: Admitting: Physician Assistant

## 2023-12-10 VITALS — BP 132/78 | HR 63 | Temp 97.6°F | Ht 64.0 in | Wt 164.0 lb

## 2023-12-10 DIAGNOSIS — K219 Gastro-esophageal reflux disease without esophagitis: Secondary | ICD-10-CM | POA: Diagnosis not present

## 2023-12-10 DIAGNOSIS — E559 Vitamin D deficiency, unspecified: Secondary | ICD-10-CM | POA: Diagnosis not present

## 2023-12-10 DIAGNOSIS — N3000 Acute cystitis without hematuria: Secondary | ICD-10-CM

## 2023-12-10 DIAGNOSIS — F419 Anxiety disorder, unspecified: Secondary | ICD-10-CM

## 2023-12-10 DIAGNOSIS — E1165 Type 2 diabetes mellitus with hyperglycemia: Secondary | ICD-10-CM

## 2023-12-10 DIAGNOSIS — E1159 Type 2 diabetes mellitus with other circulatory complications: Secondary | ICD-10-CM

## 2023-12-10 DIAGNOSIS — E1169 Type 2 diabetes mellitus with other specified complication: Secondary | ICD-10-CM | POA: Diagnosis not present

## 2023-12-10 DIAGNOSIS — I2510 Atherosclerotic heart disease of native coronary artery without angina pectoris: Secondary | ICD-10-CM | POA: Diagnosis not present

## 2023-12-10 DIAGNOSIS — E039 Hypothyroidism, unspecified: Secondary | ICD-10-CM

## 2023-12-10 DIAGNOSIS — I152 Hypertension secondary to endocrine disorders: Secondary | ICD-10-CM

## 2023-12-10 DIAGNOSIS — F330 Major depressive disorder, recurrent, mild: Secondary | ICD-10-CM | POA: Diagnosis not present

## 2023-12-10 DIAGNOSIS — E538 Deficiency of other specified B group vitamins: Secondary | ICD-10-CM | POA: Diagnosis not present

## 2023-12-10 DIAGNOSIS — Z720 Tobacco use: Secondary | ICD-10-CM

## 2023-12-10 LAB — POCT URINALYSIS DIP (CLINITEK)
Bilirubin, UA: NEGATIVE
Blood, UA: NEGATIVE
Glucose, UA: NEGATIVE mg/dL
Ketones, POC UA: NEGATIVE mg/dL
Nitrite, UA: NEGATIVE
Spec Grav, UA: 1.01 (ref 1.010–1.025)
Urobilinogen, UA: 0.2 U/dL
pH, UA: 7.5 (ref 5.0–8.0)

## 2023-12-10 MED ORDER — CIPROFLOXACIN HCL 500 MG PO TABS
500.0000 mg | ORAL_TABLET | Freq: Two times a day (BID) | ORAL | 0 refills | Status: AC
  Filled 2023-12-10: qty 20, 10d supply, fill #0

## 2023-12-10 NOTE — Progress Notes (Signed)
 Subjective:  Patient ID: Cassandra Fox, female    DOB: 05/21/1955  Age: 68 y.o. MRN: [id number]  Chief Complaint  Patient presents with   Medical Management of Chronic Issues    Hyperlipidemia  Hypertension  Diabetes    Pt presents for follow up of hypertension. The patient is tolerating the medication well without side effects. Compliance with treatment has been good; including taking medication as directed , maintains a healthy diet and regular exercise regimen , and following up as directed. Pt currently taking hyzaar  100/25 and metoprolol  25mg   Denies chest pain or dyspnea  Pt with hypothyroidism - currently on synthroid  and due to check labwork  Pt with NIDDM - currently on glucophage  500mg  bid  -sugars have been in good control ranging under 120 fasting She is up to date with eye exam - voices no problems or concerns -   Pt with  depression with anxiety - currently taking celexa  40mg  qd , buspar 5mg  bid and xanax  - symptoms have improved  Mixed hyperlipidemia  Pt presents with hyperlipidemia.  Compliance with treatment has been good The patient is compliant with medications, maintains a low cholesterol diet , follows up as directed , and maintains an exercise regimen . The patient denies experiencing any hypercholesterolemia related symptoms. Currently on simvastatin  40mg  qd and fish oil  Pt with B12 def - taking otc supplement - due for labwork  Pt takes otc vit D supplements - due for labwork  Pt with neuralgias and states gabapentin  works well for her  Pt with GERD - symptoms stable on prilosec 40mg    Pt had UTI and was treated a few weeks ago - overall feeling better but still having some mild urgency  Pt would like lung cancer screening done Current Outpatient Medications on File Prior to Visit  Medication Sig Dispense Refill   ALPRAZolam  (XANAX ) 0.5 MG tablet Take 1 tablet (0.5 mg total) by mouth 2 (two) times daily as needed for anxiety. 60 tablet 0    aspirin EC 81 MG tablet Take 81 mg by mouth daily.     busPIRone (BUSPAR) 5 MG tablet Take 1 tablet (5 mg total) by mouth 2 (two) times daily. 60 tablet 1   citalopram  (CELEXA ) 40 MG tablet Take 1 tablet (40 mg total) by mouth daily. 90 tablet 1   gabapentin  (NEURONTIN ) 300 MG capsule Take 1 capsule (300 mg total) by mouth at bedtime. 30 capsule 1   gatifloxacin  (ZYMAXID ) 0.5 % SOLN Place 1 drop into the left eye 4 (four) times daily as directed. Store upside down! 5 mL 1   glucose blood (ONETOUCH VERIO) test strip USE TO CHECK BLOOD GLUCOSE TWICE DAILY 100 strip 4   ketorolac  (ACULAR ) 0.5 % ophthalmic solution Place 1 drop into the left eye 2 (two) times daily as directed 10 mL 1   levothyroxine  (SYNTHROID ) 75 MCG tablet Take 1 tablet (75 mcg total) by mouth daily before breakfast. 90 tablet 1   losartan -hydrochlorothiazide  (HYZAAR ) 100-25 MG tablet Take 1 tablet by mouth daily. 90 tablet 0   metFORMIN  (GLUCOPHAGE ) 500 MG tablet Take 1 tablet (500 mg total) by mouth 2 (two) times daily. 180 tablet 2   metoprolol  tartrate (LOPRESSOR ) 25 MG tablet Take 1 tablet (25 mg total) by mouth daily. 90 tablet 1   Multiple Vitamins-Minerals (OCUVITE PRESERVISION PO) Take 1 tablet by mouth daily.     Omega-3 Fatty Acids (FISH OIL) 1000 MG CAPS Take 1,000 mg by mouth 2 (two)  times daily.     omeprazole  (PRILOSEC) 40 MG capsule Take 1 capsule (40 mg total) by mouth daily. 30 capsule 3   prednisoLONE  acetate (PRED FORTE ) 1 % ophthalmic suspension Place 1 drop into the left eye 4 (four) times daily as directed. Please shake before  each use!!! 5 mL 1   Probiotic Product (ALIGN PO) Take by mouth.     simvastatin  (ZOCOR ) 40 MG tablet Take 1 tablet (40 mg total) by mouth at bedtime. 90 tablet 1   Vitamin D , Cholecalciferol, 1000 units CAPS Take 1 tablet by mouth daily.     Zinc Sulfate (ZINC 15 PO) Take 1 tablet by mouth daily.     famotidine (PEPCID) 20 MG tablet Take 20 mg by mouth 2 (two) times daily.     No  current facility-administered medications on file prior to visit.   Past Medical History:  Diagnosis Date   Abnormal laboratory test 05/16/2022   Anxiety    Aortic atherosclerosis 11/19/2022   Benign hypertension 03/18/2019   BMI 26.0-26.9,adult 08/03/2020   Breast mass, right    Bursitis of right shoulder 11/14/2020   Coronary artery calcification 11/19/2022   Depression    Diabetes mellitus without complication (HCC)    Hyperlipidemia    Hyperlipidemia associated with type 2 diabetes mellitus (HCC) 11/19/2022   Hypertension    Hypothyroidism    Hypothyroidism, adult 03/18/2019   LLQ pain 01/10/2022   Major depressive disorder, recurrent episode, mild 03/18/2019   Mixed hyperlipidemia 03/18/2019   Need for vaccination for pneumococcus 11/19/2022   Neuropathy    Osteopenia of neck of right femur 07/23/2023   Peripheral vascular disease    RMSF [facility] spotted fever) 07/10/2020   Thrombocytopenia 11/19/2022   Type 2 diabetes mellitus with hyperlipidemia (HCC) 03/18/2019   Vitamin D  deficiency    Vitamin D  insufficiency 03/01/2022   Past Surgical History:  Procedure Laterality Date   BREAST EXCISIONAL BIOPSY Right    benign   BREAST LUMPECTOMY WITH RADIOACTIVE SEED LOCALIZATION Right 09/23/2015   Procedure: RIGHT BREAST LUMPECTOMY WITH RADIOACTIVE SEED LOCALIZATION;  Surgeon: Morene Olives, MD;  Location: [facility];  Service: General;  Laterality: Right;   CHOLECYSTECTOMY     EYE SURGERY Left 10/15/2022    Family History  Problem Relation Age of Onset   Hypertension Mother    Hyperlipidemia Mother    Diabetes Mother    Colon cancer Mother    Hyperlipidemia Father    Hypertension Father    Stroke Sister    Social History   Socioeconomic History   Marital status: Widowed    Spouse name: Dempsey   Number of children: 3   Years of education: Not on file   Highest education level: Associate degree: occupational, scientist, product/process development, or vocational  program  Occupational History   Occupation: Retired Theme [employer] Manager  Tobacco Use   Smoking status: Every Day    Current packs/day: 0.50    Average packs/day: 0.5 packs/day for 40.0 years (20.0 ttl pk-yrs)    Types: Cigarettes   Smokeless tobacco: Never  Vaping Use   Vaping status: Never Used  Substance and Sexual Activity   Alcohol use: Never    Comment: social   Drug use: Never   Sexual activity: Not Currently  Other Topics Concern   Not on file  Social History Narrative   Not on file   Social Drivers of Health   Financial Resource Strain: Low Risk  (11/06/2023)   Overall Physicist, Medical Strain (  CARDIA)    Difficulty of Paying Living Expenses: Not hard at all  Food Insecurity: No Food Insecurity (11/06/2023)   Hunger Vital Sign    Worried About Running Out of Food in the Last Year: Never true    Ran Out of Food in the Last Year: Never true  Transportation Needs: No Transportation Needs (11/06/2023)   PRAPARE - Administrator, Civil Service (Medical): No    Lack of Transportation (Non-Medical): No  Physical Activity: Insufficiently Active (11/06/2023)   Exercise Vital Sign    Days of Exercise per Week: 3 days    Minutes of Exercise per Session: 20 min  Stress: Stress Concern Present (11/06/2023)   [employer] of Occupational Health - Occupational Stress Questionnaire    Feeling of Stress: Rather much  Social Connections: Moderately Isolated (11/06/2023)   Social Connection and Isolation Panel    Frequency of Communication with Friends and Family: More than three times a week    Frequency of Social Gatherings with Friends and Family: Once a week    Attends Religious Services: More than 4 times per year    Active Member of [employer] or Organizations: No    Attends Banker Meetings: Not on file    Marital Status: Widowed   CONSTITUTIONAL: Negative for chills, fatigue, fever, E/N/T: Negative for ear pain, nasal congestion and sore throat.   CARDIOVASCULAR: Negative for chest pain, dizziness, palpitations and pedal edema.  RESPIRATORY: Negative for recent cough and dyspnea.  GASTROINTESTINAL: Negative for abdominal pain, acid reflux symptoms, constipation, diarrhea, nausea and vomiting.  MSK: Negative for arthralgias and myalgias.  INTEGUMENTARY: Negative for rash.  NEUROLOGICAL: Negative for dizziness and headaches.  PSYCHIATRIC: Negative for sleep disturbance and to question depression screen.  Negative for depression, negative for anhedonia.       Objective:  PHYSICAL EXAM:   VS: BP 132/78   Pulse 63   Temp 97.6 F (36.4 C)   Ht 5' 4 (1.626 m)   Wt 164 lb (74.4 kg)   SpO2 98%   BMI 28.15 kg/m   GEN: Well nourished, well developed, in no acute distress  Cardiac: RRR; no murmurs, rubs, or gallops,no edema - Respiratory:  normal respiratory rate and pattern with no distress - normal breath sounds with no rales, rhonchi, wheezes or rubs GI: normal bowel sounds, no masses or tenderness MS: no deformity or atrophy  Skin: warm and dry, no rash  Neuro:  Alert and Oriented x 3,  - CN II-Xii grossly intact Psych: euthymic mood, appropriate affect and demeanor  Office Visit on 12/10/2023  Component Date Value Ref Range Status   Color, UA 12/10/2023 yellow  yellow Final   Clarity, UA 12/10/2023 clear  clear Final   Glucose, UA 12/10/2023 negative  negative mg/dL Final   Bilirubin, UA 88/81/7974 negative  negative Final   Ketones, POC UA 12/10/2023 negative  negative mg/dL Final   Spec Grav, UA 88/81/7974 1.010  1.010 - 1.025 Final   Blood, UA 12/10/2023 negative  negative Final   pH, UA 12/10/2023 7.5  5.0 - 8.0 Final   POC PROTEIN,UA 12/10/2023 trace  negative, trace Final   Urobilinogen, UA 12/10/2023 0.2  0.2 or 1.0 E.U./dL Final   Nitrite, UA 88/81/7974 Negative  Negative Final   Leukocytes, UA 12/10/2023 Small (1+) (A)  Negative Final    Lab Results  Component Value Date   WBC 11.1 (H) 07/23/2023   HGB  13.0 07/23/2023   HCT 40.0 07/23/2023  PLT 236 07/23/2023   GLUCOSE 100 (H) 07/23/2023   CHOL 168 07/23/2023   TRIG 286 (H) 07/23/2023   HDL 37 (L) 07/23/2023   LDLCALC 84 07/23/2023   ALT 16 07/23/2023   AST 16 07/23/2023   NA 133 (L) 07/23/2023   K 4.1 07/23/2023   CL 94 (L) 07/23/2023   CREATININE 0.91 07/23/2023   BUN 12 07/23/2023   CO2 23 07/23/2023   TSH 3.740 07/23/2023   HGBA1C 6.3 (H) 07/23/2023   MICROALBUR 30 03/19/2019      Assessment & Plan:   Problem List Items Addressed This Visit       Cardiovascular and Mediastinum   Hypertension associated with diabetes (HCC)   Relevant Orders   CBC with Differential/Platelet   Comprehensive metabolic panel Continue meds     Endocrine   Hypothyroidism, adult   Relevant Orders   TSH Continue med     Other   Hyperlipidemia associated with diabetes (HCC)   Relevant Orders   Lipid panel Watch diet - low fat/low chol Continue med   Major depressive disorder, recurrent episode, mild (HCC) Continue meds   Other Visit Diagnoses     Type 2 diabetes mellitus with hyperglycemia (HCC)       Relevant Medications   metFORMIN  (GLUCOPHAGE ) 500 MG tablet   Other Relevant Orders   CBC with Differential/Platelet   Comprehensive metabolic panel   Hemoglobin A1c Low carb/low sugar diet Urine microalbumin    Vitamin D  insufficiency       Relevant Orders   VITAMIN D  25 Hydroxy (Vit-D Deficiency, Fractures)   Coronary artery calcification Recommend asa qd and continue statin Follow up with cardiology as directed  Anxiety Continue celexa  and ativan as directed  GERD  Continue meds  B12 def Continue otc supplement Labwork pending  Tobacco abuse Lung CT ordered  UTI Rx cipro  500mg  bid     .  Meds ordered this encounter  Medications   ciprofloxacin  (CIPRO ) 500 MG tablet    Sig: Take 1 tablet (500 mg total) by mouth 2 (two) times daily for 10 days.    Dispense:  20 tablet    Refill:  0     Supervising Provider:   COX, KIRSTEN [phone]    Orders Placed This Encounter  Procedures   CT CHEST LUNG CA SCREEN LOW DOSE W/O CM   CBC with Differential/Platelet   Comprehensive metabolic panel with GFR   TSH   Lipid panel   VITAMIN D  25 Hydroxy (Vit-D Deficiency, Fractures)   B12 and Folate Panel   Hemoglobin A1c   POCT URINALYSIS DIP (CLINITEK)     Follow-up: Return in about 4 months (around 04/08/2024).  An After Visit Summary was printed and given to the patient.  Cassandra Fox [facility] [phone]

## 2023-12-11 ENCOUNTER — Other Ambulatory Visit (HOSPITAL_COMMUNITY): Payer: Self-pay

## 2023-12-11 ENCOUNTER — Other Ambulatory Visit: Payer: Self-pay | Admitting: Physician Assistant

## 2023-12-11 ENCOUNTER — Encounter: Payer: Self-pay | Admitting: Physician Assistant

## 2023-12-11 ENCOUNTER — Other Ambulatory Visit (HOSPITAL_BASED_OUTPATIENT_CLINIC_OR_DEPARTMENT_OTHER): Payer: Self-pay

## 2023-12-11 ENCOUNTER — Ambulatory Visit: Payer: Self-pay | Admitting: Physician Assistant

## 2023-12-11 DIAGNOSIS — E1169 Type 2 diabetes mellitus with other specified complication: Secondary | ICD-10-CM

## 2023-12-11 LAB — LIPID PANEL
Chol/HDL Ratio: 3.5 ratio (ref 0.0–4.4)
Cholesterol, Total: 157 mg/dL (ref 100–199)
HDL: 45 mg/dL (ref >39)
LDL Chol Calc (NIH): 74 mg/dL (ref 0–99)
Triglycerides: 234 mg/dL — ABNORMAL HIGH (ref 0–149)
VLDL Cholesterol Cal: 38 mg/dL (ref 5–40)

## 2023-12-11 LAB — COMPREHENSIVE METABOLIC PANEL WITH GFR
ALT: 19 IU/L (ref 0–32)
AST: 18 IU/L (ref 0–40)
Albumin: 3.9 g/dL (ref 3.9–4.9)
Alkaline Phosphatase: 78 IU/L (ref 49–135)
BUN/Creatinine Ratio: 10 — ABNORMAL LOW (ref 12–28)
BUN: 9 mg/dL (ref 8–27)
Bilirubin Total: 0.4 mg/dL (ref 0.0–1.2)
CO2: 25 mmol/L (ref 20–29)
Calcium: 9.2 mg/dL (ref 8.7–10.3)
Chloride: 91 mmol/L — ABNORMAL LOW (ref 96–106)
Creatinine, Ser: 0.92 mg/dL (ref 0.57–1.00)
Globulin, Total: 3.1 g/dL (ref 1.5–4.5)
Glucose: 119 mg/dL — ABNORMAL HIGH (ref 70–99)
Potassium: 3.8 mmol/L (ref 3.5–5.2)
Sodium: 132 mmol/L — ABNORMAL LOW (ref 134–144)
Total Protein: 7 g/dL (ref 6.0–8.5)
eGFR: 67 mL/min/1.73 (ref >59)

## 2023-12-11 LAB — CBC WITH DIFFERENTIAL/PLATELET
Basophils Absolute: 0.1 x10E3/uL (ref 0.0–0.2)
Basos: 1 %
EOS (ABSOLUTE): 0.7 x10E3/uL — ABNORMAL HIGH (ref 0.0–0.4)
Eos: 7 %
Hematocrit: 39.8 % (ref 34.0–46.6)
Hemoglobin: 12.8 g/dL (ref 11.1–15.9)
Immature Grans (Abs): 0.1 x10E3/uL (ref 0.0–0.1)
Immature Granulocytes: 1 %
Lymphocytes Absolute: 1.9 x10E3/uL (ref 0.7–3.1)
Lymphs: 20 %
MCH: 28.5 pg (ref 26.6–33.0)
MCHC: 32.2 g/dL (ref 31.5–35.7)
MCV: 89 fL (ref 79–97)
Monocytes Absolute: 0.9 x10E3/uL (ref 0.1–0.9)
Monocytes: 9 %
Neutrophils Absolute: 6 x10E3/uL (ref 1.4–7.0)
Neutrophils: 62 %
Platelets: 179 x10E3/uL (ref 150–450)
RBC: 4.49 x10E6/uL (ref 3.77–5.28)
RDW: 13.1 % (ref 11.7–15.4)
WBC: 9.6 x10E3/uL (ref 3.4–10.8)

## 2023-12-11 LAB — TSH: TSH: 6.91 u[IU]/mL — ABNORMAL HIGH (ref 0.450–4.500)

## 2023-12-11 LAB — HEMOGLOBIN A1C
Est. average glucose Bld gHb Est-mCnc: 128 mg/dL
Hgb A1c MFr Bld: 6.1 % — ABNORMAL HIGH (ref 4.8–5.6)

## 2023-12-11 LAB — VITAMIN D 25 HYDROXY (VIT D DEFICIENCY, FRACTURES): Vit D, 25-Hydroxy: 44.2 ng/mL (ref 30.0–100.0)

## 2023-12-11 LAB — B12 AND FOLATE PANEL
Folate: 7.5 ng/mL (ref >3.0)
Vitamin B-12: 535 pg/mL (ref 232–1245)

## 2023-12-11 MED ORDER — ROSUVASTATIN CALCIUM 10 MG PO TABS
10.0000 mg | ORAL_TABLET | Freq: Every day | ORAL | 0 refills | Status: DC
  Filled 2023-12-11 – 2023-12-12 (×2): qty 90, 90d supply, fill #0

## 2023-12-12 ENCOUNTER — Other Ambulatory Visit (HOSPITAL_BASED_OUTPATIENT_CLINIC_OR_DEPARTMENT_OTHER): Payer: Self-pay

## 2023-12-13 ENCOUNTER — Ambulatory Visit: Admitting: Physician Assistant

## 2023-12-17 ENCOUNTER — Ambulatory Visit (HOSPITAL_BASED_OUTPATIENT_CLINIC_OR_DEPARTMENT_OTHER): Admitting: Radiology

## 2023-12-18 ENCOUNTER — Other Ambulatory Visit (HOSPITAL_COMMUNITY): Payer: Self-pay

## 2023-12-21 ENCOUNTER — Other Ambulatory Visit: Payer: Self-pay | Admitting: Physician Assistant

## 2023-12-21 DIAGNOSIS — F330 Major depressive disorder, recurrent, mild: Secondary | ICD-10-CM

## 2023-12-21 DIAGNOSIS — F419 Anxiety disorder, unspecified: Secondary | ICD-10-CM

## 2023-12-23 ENCOUNTER — Other Ambulatory Visit (HOSPITAL_COMMUNITY): Payer: Self-pay

## 2023-12-23 ENCOUNTER — Other Ambulatory Visit (HOSPITAL_BASED_OUTPATIENT_CLINIC_OR_DEPARTMENT_OTHER): Payer: Self-pay

## 2023-12-23 ENCOUNTER — Ambulatory Visit (INDEPENDENT_AMBULATORY_CARE_PROVIDER_SITE_OTHER)
Admission: RE | Admit: 2023-12-23 | Discharge: 2023-12-23 | Disposition: A | Source: Ambulatory Visit | Attending: Physician Assistant | Admitting: Physician Assistant

## 2023-12-23 ENCOUNTER — Encounter: Payer: Self-pay | Admitting: Physician Assistant

## 2023-12-23 ENCOUNTER — Other Ambulatory Visit: Payer: Self-pay | Admitting: Physician Assistant

## 2023-12-23 DIAGNOSIS — F17210 Nicotine dependence, cigarettes, uncomplicated: Secondary | ICD-10-CM

## 2023-12-23 DIAGNOSIS — Z720 Tobacco use: Secondary | ICD-10-CM

## 2023-12-23 MED ORDER — DULOXETINE HCL 30 MG PO CPEP
30.0000 mg | ORAL_CAPSULE | Freq: Every day | ORAL | 0 refills | Status: DC
  Filled 2023-12-23 (×2): qty 60, 30d supply, fill #0

## 2023-12-23 MED ORDER — BUSPIRONE HCL 5 MG PO TABS
5.0000 mg | ORAL_TABLET | Freq: Two times a day (BID) | ORAL | 3 refills | Status: DC
  Filled 2023-12-23 – 2023-12-25 (×2): qty 60, 30d supply, fill #0
  Filled 2024-01-25: qty 60, 30d supply, fill #1

## 2023-12-23 MED ORDER — OMEPRAZOLE 40 MG PO CPDR
40.0000 mg | DELAYED_RELEASE_CAPSULE | Freq: Every day | ORAL | 3 refills | Status: DC
  Filled 2023-12-23: qty 30, 30d supply, fill #0
  Filled 2024-01-18: qty 30, 30d supply, fill #1
  Filled 2024-01-21: qty 30, 30d supply, fill #0
  Filled 2024-01-21 – 2024-02-10 (×2): qty 30, 30d supply, fill #1
  Filled 2024-02-10 – 2024-02-14 (×2): qty 30, 30d supply, fill #0

## 2023-12-24 ENCOUNTER — Ambulatory Visit (HOSPITAL_BASED_OUTPATIENT_CLINIC_OR_DEPARTMENT_OTHER): Admitting: Radiology

## 2023-12-24 ENCOUNTER — Other Ambulatory Visit (HOSPITAL_COMMUNITY): Payer: Self-pay

## 2023-12-25 ENCOUNTER — Other Ambulatory Visit (HOSPITAL_BASED_OUTPATIENT_CLINIC_OR_DEPARTMENT_OTHER): Payer: Self-pay

## 2023-12-26 ENCOUNTER — Encounter: Payer: Self-pay | Admitting: Physician Assistant

## 2023-12-26 ENCOUNTER — Encounter: Payer: Self-pay | Admitting: Cardiology

## 2023-12-27 ENCOUNTER — Ambulatory Visit (HOSPITAL_BASED_OUTPATIENT_CLINIC_OR_DEPARTMENT_OTHER): Admission: EM | Admit: 2023-12-27 | Discharge: 2023-12-27 | Disposition: A | Discharge status: Home / Self Care

## 2023-12-27 ENCOUNTER — Encounter (HOSPITAL_BASED_OUTPATIENT_CLINIC_OR_DEPARTMENT_OTHER): Payer: Self-pay

## 2023-12-27 ENCOUNTER — Telehealth: Payer: Self-pay

## 2023-12-27 ENCOUNTER — Other Ambulatory Visit (HOSPITAL_BASED_OUTPATIENT_CLINIC_OR_DEPARTMENT_OTHER): Payer: Self-pay

## 2023-12-27 ENCOUNTER — Other Ambulatory Visit: Payer: Self-pay | Admitting: Physician Assistant

## 2023-12-27 DIAGNOSIS — I10 Essential (primary) hypertension: Secondary | ICD-10-CM

## 2023-12-27 MED ORDER — DOXYCYCLINE HYCLATE 100 MG PO TABS
100.0000 mg | ORAL_TABLET | Freq: Two times a day (BID) | ORAL | 0 refills | Status: DC
  Filled 2023-12-27: qty 20, 10d supply, fill #0

## 2023-12-27 NOTE — ED Provider Notes (Signed)
 [facility]    CSN: [id number] Arrival date & time: 12/27/23  1448      History   Chief Complaint No chief complaint on file.   HPI Cassandra Fox is a 68 y.o. female.   Patient is a 68 year old female who presents today with elevated blood pressure readings.  Pt states that her bp has been running higher than normal this week, 160s/80s takes losartan /metoprolol . Cardiologist has taken pt off of amlodipine  due to leg swelling. Pt tried to contact her cardiologist yesterday , but was not able to. Denies headache,blurred vision, chest pain. Did take the Amlodipine  the past few days. Very worried about BP     Past Medical History:  Diagnosis Date   Abnormal laboratory test 05/16/2022   Anxiety    Aortic atherosclerosis 11/19/2022   Benign hypertension 03/18/2019   BMI 26.0-26.9,adult 08/03/2020   Breast mass, right    Bursitis of right shoulder 11/14/2020   Coronary artery calcification 11/19/2022   Depression    Diabetes mellitus without complication (HCC)    Hyperlipidemia    Hyperlipidemia associated with type 2 diabetes mellitus (HCC) 11/19/2022   Hypertension    Hypothyroidism    Hypothyroidism, adult 03/18/2019   LLQ pain 01/10/2022   Major depressive disorder, recurrent episode, mild 03/18/2019   Mixed hyperlipidemia 03/18/2019   Need for vaccination for pneumococcus 11/19/2022   Neuropathy    Osteopenia of neck of right femur 07/23/2023   Peripheral vascular disease    [facility] spotted fever) 07/10/2020   Thrombocytopenia 11/19/2022   Type 2 diabetes mellitus with hyperlipidemia (HCC) 03/18/2019   Vitamin D  deficiency    Vitamin D  insufficiency 03/01/2022    Patient Active Problem List   Diagnosis Date Noted   Peripheral vascular disease    Osteopenia of neck of right femur 07/23/2023   Breast mass, right    Depression    Diabetes mellitus without complication (HCC)    Hyperlipidemia    Hypertension    Hypothyroidism     Neuropathy    Vitamin D  deficiency    Thrombocytopenia 11/19/2022   Hyperlipidemia associated with type 2 diabetes mellitus (HCC) 11/19/2022   Need for vaccination for pneumococcus 11/19/2022   Coronary artery calcification 11/19/2022   Aortic atherosclerosis 11/19/2022   Abnormal laboratory test 05/16/2022   Vitamin D  insufficiency 03/01/2022   LLQ pain 01/10/2022   Bursitis of right shoulder 11/14/2020   BMI 26.0-26.9,adult 08/03/2020   [facility] spotted fever) 07/10/2020   Anxiety 03/30/2020   Mixed hyperlipidemia 03/18/2019   Hypothyroidism, adult 03/18/2019   Major depressive disorder, recurrent episode, mild 03/18/2019   Type 2 diabetes mellitus with hyperlipidemia (HCC) 03/18/2019   Benign hypertension 03/18/2019    Past Surgical History:  Procedure Laterality Date   BREAST EXCISIONAL BIOPSY Right    benign   BREAST LUMPECTOMY WITH RADIOACTIVE SEED LOCALIZATION Right 09/23/2015   Procedure: RIGHT BREAST LUMPECTOMY WITH RADIOACTIVE SEED LOCALIZATION;  Surgeon: Morene Olives, MD;  Location: [facility];  Service: General;  Laterality: Right;   CHOLECYSTECTOMY     EYE SURGERY Left 10/15/2022    OB History   No obstetric history on file.      Home Medications    Prior to Admission medications   Medication Sig Start Date End Date Taking? Authorizing Provider  ALPRAZolam  (XANAX ) 0.5 MG tablet Take 1 tablet (0.5 mg total) by mouth 2 (two) times daily as needed for anxiety. 12/02/23   Nicholaus Credit, PA-C  aspirin  EC 81 MG tablet Take 81 mg by mouth daily.    [provider]  busPIRone  (BUSPAR ) 5 MG tablet Take 1 tablet (5 mg total) by mouth 2 (two) times daily. 12/23/23   Nicholaus Credit, PA-C  doxycycline  (VIBRA -TABS) 100 MG tablet Take 1 tablet (100 mg total) by mouth 2 (two) times daily. 12/27/23   Nicholaus Credit, PA-C  DULoxetine  (CYMBALTA ) 30 MG capsule Take 1 capsule by mouth once daily for 1 week THEN increase to 2 capsules daily 12/23/23    Nicholaus Credit, PA-C  gabapentin  (NEURONTIN ) 300 MG capsule Take 1 capsule (300 mg total) by mouth at bedtime. 11/04/23   Nicholaus Credit, PA-C  gatifloxacin  (ZYMAXID ) 0.5 % SOLN Place 1 drop into the left eye 4 (four) times daily as directed. Store upside down! 10/22/23     glucose blood (ONETOUCH VERIO) test strip USE TO CHECK BLOOD GLUCOSE TWICE DAILY 07/11/21   Abran Jerilynn Loving, MD  ketorolac  (ACULAR ) 0.5 % ophthalmic solution Place 1 drop into the left eye 2 (two) times daily as directed 10/22/23     levothyroxine  (SYNTHROID ) 75 MCG tablet Take 1 tablet (75 mcg total) by mouth daily before breakfast. 11/04/23   Nicholaus Credit, PA-C  losartan -hydrochlorothiazide  (HYZAAR ) 100-25 MG tablet Take 1 tablet by mouth daily. 12/02/23   Nicholaus Credit, PA-C  metFORMIN  (GLUCOPHAGE ) 500 MG tablet Take 1 tablet (500 mg total) by mouth 2 (two) times daily. 04/19/23   Nicholaus Credit, PA-C  metoprolol  tartrate (LOPRESSOR ) 25 MG tablet Take 1 tablet (25 mg total) by mouth daily. 11/20/23   Cox, Abigail, MD  Multiple Vitamins-Minerals (OCUVITE PRESERVISION PO) Take 1 tablet by mouth daily.    [provider]  Omega-3 Fatty Acids (FISH OIL) 1000 MG CAPS Take 1,000 mg by mouth 2 (two) times daily.    [provider]  omeprazole  (PRILOSEC) 40 MG capsule Take 1 capsule (40 mg total) by mouth daily. 12/23/23   Nicholaus Credit, PA-C  prednisoLONE  acetate (PRED FORTE ) 1 % ophthalmic suspension Place 1 drop into the left eye 4 (four) times daily as directed. Please shake before  each use!!! 10/22/23     Probiotic Product (ALIGN PO) Take by mouth.    [provider]  rosuvastatin  (CRESTOR ) 10 MG tablet Take 1 tablet (10 mg total) by mouth daily. 12/11/23   Nicholaus Credit, PA-C  Vitamin D , Cholecalciferol, 1000 units CAPS Take 1 tablet by mouth daily.    [provider]  Zinc Sulfate (ZINC 15 PO) Take 1 tablet by mouth daily.    [provider]    Family History Family History  Problem Relation  Age of Onset   Hypertension Mother    Hyperlipidemia Mother    Diabetes Mother    Colon cancer Mother    Hyperlipidemia Father    Hypertension Father    Stroke Sister     Social History Social History   Tobacco Use   Smoking status: Every Day    Current packs/day: 0.50    Average packs/day: 0.5 packs/day for 40.0 years (20.0 ttl pk-yrs)    Types: Cigarettes   Smokeless tobacco: Never  Vaping Use   Vaping status: Never Used  Substance Use Topics   Alcohol use: Never    Comment: social   Drug use: Never     Allergies   Codeine   Review of Systems Review of Systems See HPI  Physical Exam Triage Vital Signs ED Triage Vitals  Encounter Vitals Group     BP  12/27/23 1525 (!) 151/83     Girls Systolic BP Percentile --      Girls Diastolic BP Percentile --      Boys Systolic BP Percentile --      Boys Diastolic BP Percentile --      Pulse Rate 12/27/23 1525 (!) 58     Resp 12/27/23 1525 18     Temp 12/27/23 1525 98.6 F (37 C)     Temp Source 12/27/23 1525 Oral     SpO2 12/27/23 1525 96 %     Weight --      Height --      Head Circumference --      Peak Flow --      Pain Score 12/27/23 1523 0     Pain Loc --      Pain Education --      Exclude from Growth Chart --    No data found.  Updated Vital Signs BP (!) 151/83 (BP Location: Right Arm)   Pulse (!) 58   Temp 98.6 F (37 C) (Oral)   Resp 18   SpO2 96%   Visual Acuity Right Eye Distance:   Left Eye Distance:   Bilateral Distance:    Right Eye Near:   Left Eye Near:    Bilateral Near:     Physical Exam Vitals and nursing note reviewed.  Constitutional:      General: She is not in acute distress.    Appearance: Normal appearance. She is not ill-appearing, toxic-appearing or diaphoretic.  Pulmonary:     Effort: Pulmonary effort is normal.  Neurological:     Mental Status: She is alert.  Psychiatric:        Mood and Affect: Mood normal.      UC Treatments / Results  Labs (all labs  ordered are listed, but only abnormal results are displayed) Labs Reviewed - No data to display  EKG   Radiology No results found.  Procedures Procedures (including critical care time)  Medications Ordered in UC Medications - No data to display  Initial Impression / Assessment and Plan / UC Course  I have reviewed the triage vital signs and the nursing notes.  Pertinent labs & imaging results that were available during my care of the patient were reviewed by me and considered in my medical decision making (see chart for details).     Essential hypertension- blood pressure is not concerning just mildly elevated today.  She is not having any concerning signs, symptoms or red flags.  Recommend take the Xanax  as needed for anxiety.  Natural remedies are Ashwagandha 200 mg daily and Magnesium 200 mg daily.  Reduce your sodium and caffeine intake.  Try to relax.  Contact your PCP or cardiology  next week.  Final Clinical Impressions(s) / UC Diagnoses   Final diagnoses:  Essential hypertension     Discharge Instructions      Your blood pressure is not concerning just mildly elevated today.  And you are not having any concerning symptoms. Recommend take the Xanax  as needed for anxiety.  Natural remedies are Ashwagandha 200 mg daily and Magnesium 200 mg daily.  Reduce your sodium and caffeine intake.  Try to relax.  Contact your PCP or cardiology  next week.      ED Prescriptions   None    PDMP not reviewed this encounter.   Adah Wilbert LABOR, FNP 12/28/23 [phone]

## 2023-12-27 NOTE — Telephone Encounter (Signed)
 Provider already saw the results and sent message to patient.  Copied from CRM #[id number]. Topic: Clinical - Lab/Test Results >> Dec 27, 2023 11:52 AM Ivette P wrote: Reason for CRM: Diane from [facility] radiology called in to report that foundign of CT Lung Cancer screening have been added to pt chart and can be seen. If Provider can relay results to pt.    CT CHEST LUNG CA SCREEN LOW DOSE W/O CM  Callback if any questions [phone]

## 2023-12-27 NOTE — ED Triage Notes (Addendum)
 Pt states that her bp has been running higher than normal this week, 160s/80s takes losartan /metoprolol . Cardiologist has taken pt off of amlodipine  due to leg swelling. Pt tried to contact her cardiologist yesterday , but was not able to.  I would like you all to check my Bp today or any recommendations to get my bp lowered  Denies headache,blurred vision, chest pain.

## 2023-12-27 NOTE — Discharge Instructions (Signed)
 Your blood pressure is not concerning just mildly elevated today.  And you are not having any concerning symptoms. Recommend take the Xanax  as needed for anxiety.  Natural remedies are Ashwagandha 200 mg daily and Magnesium 200 mg daily.  Reduce your sodium and caffeine intake.  Try to relax.  Contact your PCP or cardiology  next week.

## 2023-12-31 ENCOUNTER — Ambulatory Visit: Admitting: Physician Assistant

## 2023-12-31 ENCOUNTER — Encounter: Payer: Self-pay | Admitting: Physician Assistant

## 2023-12-31 ENCOUNTER — Other Ambulatory Visit (HOSPITAL_BASED_OUTPATIENT_CLINIC_OR_DEPARTMENT_OTHER): Payer: Self-pay

## 2023-12-31 VITALS — BP 138/72 | HR 63 | Temp 98.3°F | Resp 18 | Ht 64.0 in | Wt 166.4 lb

## 2023-12-31 DIAGNOSIS — K209 Esophagitis, unspecified without bleeding: Secondary | ICD-10-CM

## 2023-12-31 DIAGNOSIS — J439 Emphysema, unspecified: Secondary | ICD-10-CM | POA: Diagnosis not present

## 2023-12-31 DIAGNOSIS — E039 Hypothyroidism, unspecified: Secondary | ICD-10-CM | POA: Diagnosis not present

## 2023-12-31 DIAGNOSIS — E1159 Type 2 diabetes mellitus with other circulatory complications: Secondary | ICD-10-CM | POA: Diagnosis not present

## 2023-12-31 DIAGNOSIS — F330 Major depressive disorder, recurrent, mild: Secondary | ICD-10-CM

## 2023-12-31 DIAGNOSIS — I152 Hypertension secondary to endocrine disorders: Secondary | ICD-10-CM | POA: Diagnosis not present

## 2023-12-31 MED ORDER — LEVOTHYROXINE SODIUM 88 MCG PO TABS
88.0000 ug | ORAL_TABLET | Freq: Every day | ORAL | 3 refills | Status: AC
  Filled 2023-12-31: qty 90, 90d supply, fill #0

## 2023-12-31 MED ORDER — BREZTRI AEROSPHERE 160-9-4.8 MCG/ACT IN AERO
2.0000 | INHALATION_SPRAY | Freq: Two times a day (BID) | RESPIRATORY_TRACT | 5 refills | Status: AC
  Filled 2023-12-31: qty 10.7, 30d supply, fill #0
  Filled 2024-01-25: qty 10.7, 30d supply, fill #1

## 2023-12-31 NOTE — Progress Notes (Signed)
 Subjective:  Patient ID: Cassandra Fox, female    DOB: Jul 02, 1955  Age: 68 y.o. MRN: [id number]  Chief Complaint  Patient presents with   Anxiety   Hypertension    HPI Pt in today for follow up of hypertension - she states she had been getting readings of 140-150/70-80s but more recently has been lower.  She denies chest pain/sob/edema.  Currently taking losartan  /Hct 100/25mg  qd and lopressor  25mg  qd Bp today good at 138/72  Pt had CT lung screening that showed area of infective bronchiolitis and treated with doxycycline .  She was also noted to have emphysema - currently not on inhaler but does state she coughs at night and is agreeable to try Aortic atherosclerosis and CAD was noted - recommend to stay on statin and will keep LDL below 70 Pt to follow up with cardiology as scheduled  Last labwork showed TSH was elevated - will increase dose of thyroid  medication and recheck in 8 weeks  Esophagitis and hiatal hernia was noted on scan.  Pt is currently on protonix and does have breakthrough reflux symptoms at times - recommend referral to GI for endoscopy especially with her smoking history  Pt was recently weaned off lexapro and started on cymbalta  - she just started the dose of 30mg  bid and is tolerating medication - has not yet noted big change in depressive symptoms but told to give a few weeks to reach full benefit of medication     12/31/2023    3:46 PM 11/07/2023    1:51 PM 07/23/2023   10:46 AM 07/23/2023   10:30 AM 04/15/2023    3:38 PM  Depression screen PHQ 2/9  Decreased Interest 3 3 1 1 1   Down, Depressed, Hopeless 2 3 1 1 1   PHQ - 2 Score 5 6 2 2 2   Altered sleeping 3 3 1  1   Tired, decreased energy 2 2 0 0 1  Change in appetite 2 0 0 0 0  Feeling bad or failure about yourself  2 0 0 0 0  Trouble concentrating 0 0 0 0 0  Moving slowly or fidgety/restless 0 0 0 0 0  Suicidal thoughts 0 0 0 0 0  PHQ-9 Score 14 11  3   4    Difficult doing work/chores Somewhat  difficult Somewhat difficult Not difficult at all Not difficult at all Somewhat difficult     Data saved with a previous flowsheet row definition        11/19/2022    9:57 AM 03/22/2023   10:16 AM 04/15/2023    2:57 PM 07/23/2023   10:30 AM 11/07/2023    1:51 PM  Fall Risk  Falls in the past year? 0 1 1 1  0  Was there an injury with Fall? 0  0  0  0  0   Fall Risk Category Calculator 0 1 1 1  0  Patient at Risk for Falls Due to No Fall Risks No Fall Risks No Fall Risks History of fall(s) No Fall Risks  Fall risk Follow up Falls evaluation completed Falls evaluation completed Falls evaluation completed;Education provided  Falls evaluation completed     Data saved with a previous flowsheet row definition     ROS CONSTITUTIONAL: Negative for chills, fatigue, fever, E/N/T: Negative for ear pain, nasal congestion and sore throat.  CARDIOVASCULAR: Negative for chest pain, dizziness, palpitations and pedal edema.  RESPIRATORY: Negative for recent cough and dyspnea.  GASTROINTESTINAL: Negative for abdominal pain, acid reflux symptoms,  constipation, diarrhea, nausea and vomiting.  MSK: Negative for arthralgias and myalgias.  INTEGUMENTARY: Negative for rash.  NEUROLOGICAL: Negative for dizziness and headaches.  PSYCHIATRIC: Negative for sleep disturbance and to question depression screen.  Negative for depression, negative for anhedonia.    Current Outpatient Medications:    ALPRAZolam  (XANAX ) 0.5 MG tablet, Take 1 tablet (0.5 mg total) by mouth 2 (two) times daily as needed for anxiety., Disp: 60 tablet, Rfl: 0   aspirin EC 81 MG tablet, Take 81 mg by mouth daily., Disp: , Rfl:    budesonide -glycopyrrolate -formoterol  (BREZTRI  AEROSPHERE) 160-9-4.8 MCG/ACT AERO inhaler, Inhale 2 puffs into the lungs 2 (two) times daily., Disp: 10.7 g, Rfl: 5   busPIRone  (BUSPAR ) 5 MG tablet, Take 1 tablet (5 mg total) by mouth 2 (two) times daily., Disp: 60 tablet, Rfl: 3   doxycycline  (VIBRA -TABS) 100 MG  tablet, Take 1 tablet (100 mg total) by mouth 2 (two) times daily., Disp: 20 tablet, Rfl: 0   DULoxetine  (CYMBALTA ) 30 MG capsule, Take 1 capsule by mouth once daily for 1 week THEN increase to 2 capsules daily, Disp: 60 capsule, Rfl: 0   gabapentin  (NEURONTIN ) 300 MG capsule, Take 1 capsule (300 mg total) by mouth at bedtime., Disp: 30 capsule, Rfl: 1   gatifloxacin  (ZYMAXID ) 0.5 % SOLN, Place 1 drop into the left eye 4 (four) times daily as directed. Store upside down!, Disp: 5 mL, Rfl: 1   glucose blood (ONETOUCH VERIO) test strip, USE TO CHECK BLOOD GLUCOSE TWICE DAILY, Disp: 100 strip, Rfl: 4   ketorolac  (ACULAR ) 0.5 % ophthalmic solution, Place 1 drop into the left eye 2 (two) times daily as directed, Disp: 10 mL, Rfl: 1   levothyroxine  (SYNTHROID ) 88 MCG tablet, Take 1 tablet (88 mcg total) by mouth daily., Disp: 90 tablet, Rfl: 3   losartan -hydrochlorothiazide  (HYZAAR ) 100-25 MG tablet, Take 1 tablet by mouth daily., Disp: 90 tablet, Rfl: 0   metFORMIN  (GLUCOPHAGE ) 500 MG tablet, Take 1 tablet (500 mg total) by mouth 2 (two) times daily., Disp: 180 tablet, Rfl: 2   metoprolol  tartrate (LOPRESSOR ) 25 MG tablet, Take 1 tablet (25 mg total) by mouth daily., Disp: 90 tablet, Rfl: 1   Multiple Vitamins-Minerals (OCUVITE PRESERVISION PO), Take 1 tablet by mouth daily., Disp: , Rfl:    Omega-3 Fatty Acids (FISH OIL) 1000 MG CAPS, Take 1,000 mg by mouth 2 (two) times daily., Disp: , Rfl:    omeprazole  (PRILOSEC) 40 MG capsule, Take 1 capsule (40 mg total) by mouth daily., Disp: 30 capsule, Rfl: 3   Probiotic Product (ALIGN PO), Take by mouth., Disp: , Rfl:    rosuvastatin  (CRESTOR ) 10 MG tablet, Take 1 tablet (10 mg total) by mouth daily., Disp: 90 tablet, Rfl: 0   Vitamin D , Cholecalciferol, 1000 units CAPS, Take 1 tablet by mouth daily., Disp: , Rfl:   Past Medical History:  Diagnosis Date   Abnormal laboratory test 05/16/2022   Anxiety    Aortic atherosclerosis 11/19/2022   Benign  hypertension 03/18/2019   BMI 26.0-26.9,adult 08/03/2020   Breast mass, right    Bursitis of right shoulder 11/14/2020   Coronary artery calcification 11/19/2022   Depression    Diabetes mellitus without complication (HCC)    Hyperlipidemia    Hyperlipidemia associated with type 2 diabetes mellitus (HCC) 11/19/2022   Hypertension    Hypothyroidism    Hypothyroidism, adult 03/18/2019   LLQ pain 01/10/2022   Major depressive disorder, recurrent episode, mild 03/18/2019   Mixed hyperlipidemia 03/18/2019  Need for vaccination for pneumococcus 11/19/2022   Neuropathy    Osteopenia of neck of right femur 07/23/2023   Peripheral vascular disease    [facility] spotted fever) 07/10/2020   Thrombocytopenia 11/19/2022   Type 2 diabetes mellitus with hyperlipidemia (HCC) 03/18/2019   Vitamin D  deficiency    Vitamin D  insufficiency 03/01/2022   Objective:  PHYSICAL EXAM:   BP 138/72   Pulse 63   Temp 98.3 F (36.8 C) (Temporal)   Resp 18   Ht 5' 4 (1.626 m)   Wt 166 lb 6.4 oz (75.5 kg)   SpO2 98%   BMI 28.56 kg/m    GEN: Well nourished, well developed, in no acute distress  Cardiac: RRR; no murmurs, rubs, or gallops,no edema -  Respiratory:  normal respiratory rate and pattern with no distress - normal breath sounds with no rales, rhonchi, wheezes or rubs MS: no deformity or atrophy  Skin: warm and dry, no rash  Neuro:  Alert and Oriented x 3,  - CN II-Xii grossly intact Psych: euthymic mood, appropriate affect and demeanor  Assessment & Plan:    Hypertension associated with diabetes (HCC) Continue current meds and keep bp log Recheck bp in 3 weeks Emphysema lung (HCC) -     Breztri  Aerosphere; Inhale 2 puffs into the lungs 2 (two) times daily.  Dispense: 10.7 g; Refill: 5 Efforts at smoking cessation Esophagitis -     Ambulatory referral to Gastroenterology Continue prilosec Major depressive disorder, recurrent episode, mild Continue current  meds Hypothyroidism, adult -     Levothyroxine  Sodium; Take 1 tablet (88 mcg total) by mouth daily.  Dispense: 90 tablet; Refill: 3 Recheck TSH in 8 weeks    Follow-up: Return in about 3 weeks (around 01/21/2024) for nurse visit BP check.  An After Visit Summary was printed and given to the patient.  Cassandra Fox [facility] [phone]

## 2024-01-02 ENCOUNTER — Other Ambulatory Visit

## 2024-01-07 ENCOUNTER — Encounter: Payer: Self-pay | Admitting: Physician Assistant

## 2024-01-08 ENCOUNTER — Other Ambulatory Visit (HOSPITAL_BASED_OUTPATIENT_CLINIC_OR_DEPARTMENT_OTHER): Payer: Self-pay

## 2024-01-08 ENCOUNTER — Other Ambulatory Visit: Payer: Self-pay | Admitting: Physician Assistant

## 2024-01-08 DIAGNOSIS — F419 Anxiety disorder, unspecified: Secondary | ICD-10-CM

## 2024-01-08 MED ORDER — ALPRAZOLAM 0.5 MG PO TABS
0.5000 mg | ORAL_TABLET | Freq: Two times a day (BID) | ORAL | 1 refills | Status: AC | PRN
  Filled 2024-01-08: qty 60, 30d supply, fill #0
  Filled 2024-02-06: qty 60, 30d supply, fill #1

## 2024-01-08 MED ORDER — GABAPENTIN 300 MG PO CAPS
300.0000 mg | ORAL_CAPSULE | Freq: Every day | ORAL | 1 refills | Status: AC
  Filled 2024-01-08: qty 30, 30d supply, fill #0
  Filled 2024-02-06: qty 30, 30d supply, fill #1

## 2024-01-09 ENCOUNTER — Ambulatory Visit

## 2024-01-14 ENCOUNTER — Other Ambulatory Visit: Payer: Self-pay | Admitting: Physician Assistant

## 2024-01-14 ENCOUNTER — Other Ambulatory Visit (HOSPITAL_BASED_OUTPATIENT_CLINIC_OR_DEPARTMENT_OTHER): Payer: Self-pay

## 2024-01-14 ENCOUNTER — Encounter: Payer: Self-pay | Admitting: Physician Assistant

## 2024-01-14 ENCOUNTER — Ambulatory Visit: Payer: Self-pay

## 2024-01-14 DIAGNOSIS — E119 Type 2 diabetes mellitus without complications: Secondary | ICD-10-CM

## 2024-01-14 MED ORDER — METFORMIN HCL 500 MG PO TABS
500.0000 mg | ORAL_TABLET | Freq: Two times a day (BID) | ORAL | 1 refills | Status: AC
  Filled 2024-01-14 – 2024-01-21 (×3): qty 180, 90d supply, fill #0

## 2024-01-14 NOTE — Telephone Encounter (Signed)
 Patient/caller refused triage.  Reason for refusal: already spoke with pcp, has appointment scheduled on 01/15/24 with pcp.  Reason for Disposition  Caller has already spoken with the doctor (or NP/PA, pharmacist) and has no further questions.  Answer Assessment - Initial Assessment Questions Returned call to patient, she thanked this clinical research associate for calling her back but that I already spoke with my primary and she answered all my questions. Verified with patient she has a self scheduled appointment on 01/15/24 with pcp. Advised patient to call back with any additional questions, concerns, or if she develops new or worsening symptoms prior to her scheduled appointment. No further needs at this time.  Protocols used: No Contact or Duplicate Contact Call-A-AH  Message from Hyden R sent at 01/14/2024  3:28 PM EST  Summary: Bronchiolitis   Reason for Triage: Pt has Bronchiolitis and is taking doxycycline . She wants to know is she contagious. And is this like RSV ? Please call pt at [phone].

## 2024-01-14 NOTE — Telephone Encounter (Signed)
 Cassandra Fox has spoken with patient since this message.

## 2024-01-15 ENCOUNTER — Ambulatory Visit: Admitting: Physician Assistant

## 2024-01-20 ENCOUNTER — Other Ambulatory Visit (HOSPITAL_BASED_OUTPATIENT_CLINIC_OR_DEPARTMENT_OTHER): Payer: Self-pay

## 2024-01-21 ENCOUNTER — Ambulatory Visit

## 2024-01-21 ENCOUNTER — Other Ambulatory Visit (HOSPITAL_BASED_OUTPATIENT_CLINIC_OR_DEPARTMENT_OTHER): Payer: Self-pay

## 2024-01-21 ENCOUNTER — Other Ambulatory Visit (HOSPITAL_COMMUNITY): Payer: Self-pay

## 2024-01-21 NOTE — Progress Notes (Signed)
 Patient is in office today for a nurse visit for Blood Pressure Check. Patient blood pressure was 140/80 HR 68, Patient No chest pain, No shortness of breath, No dyspnea on exertion, No orthopnea, No paroxysmal nocturnal dyspnea, No edema, No palpitations, No syncope.

## 2024-01-22 ENCOUNTER — Other Ambulatory Visit (HOSPITAL_COMMUNITY): Payer: Self-pay

## 2024-01-22 ENCOUNTER — Other Ambulatory Visit: Payer: Self-pay

## 2024-01-22 ENCOUNTER — Other Ambulatory Visit: Payer: Self-pay | Admitting: Physician Assistant

## 2024-01-22 MED ORDER — DULOXETINE HCL 30 MG PO CPEP
30.0000 mg | ORAL_CAPSULE | Freq: Every day | ORAL | 0 refills | Status: DC
  Filled 2024-01-22: qty 60, 30d supply, fill #0

## 2024-01-24 ENCOUNTER — Other Ambulatory Visit (HOSPITAL_COMMUNITY): Payer: Self-pay

## 2024-01-24 ENCOUNTER — Encounter: Payer: Self-pay | Admitting: Pharmacist

## 2024-01-24 ENCOUNTER — Other Ambulatory Visit: Payer: Self-pay

## 2024-01-25 ENCOUNTER — Other Ambulatory Visit (HOSPITAL_COMMUNITY): Payer: Self-pay

## 2024-01-26 ENCOUNTER — Encounter: Payer: Self-pay | Admitting: Physician Assistant

## 2024-01-27 ENCOUNTER — Other Ambulatory Visit (HOSPITAL_COMMUNITY): Payer: Self-pay

## 2024-01-27 ENCOUNTER — Encounter (HOSPITAL_COMMUNITY): Payer: Self-pay

## 2024-01-27 ENCOUNTER — Encounter: Payer: Self-pay | Admitting: Physician Assistant

## 2024-01-27 ENCOUNTER — Other Ambulatory Visit: Payer: Self-pay

## 2024-01-27 ENCOUNTER — Other Ambulatory Visit (HOSPITAL_BASED_OUTPATIENT_CLINIC_OR_DEPARTMENT_OTHER): Payer: Self-pay

## 2024-01-28 ENCOUNTER — Other Ambulatory Visit (HOSPITAL_BASED_OUTPATIENT_CLINIC_OR_DEPARTMENT_OTHER): Payer: Self-pay

## 2024-01-28 ENCOUNTER — Encounter: Payer: Self-pay | Admitting: Physician Assistant

## 2024-01-28 ENCOUNTER — Other Ambulatory Visit: Payer: Self-pay | Admitting: Physician Assistant

## 2024-01-28 ENCOUNTER — Telehealth (HOSPITAL_BASED_OUTPATIENT_CLINIC_OR_DEPARTMENT_OTHER): Payer: Self-pay

## 2024-01-28 ENCOUNTER — Ambulatory Visit: Admitting: Physician Assistant

## 2024-01-28 ENCOUNTER — Other Ambulatory Visit: Payer: Self-pay

## 2024-01-28 VITALS — BP 132/60 | HR 73 | Temp 97.2°F | Resp 18 | Ht 64.0 in | Wt 164.2 lb

## 2024-01-28 DIAGNOSIS — J060 Acute laryngopharyngitis: Secondary | ICD-10-CM

## 2024-01-28 DIAGNOSIS — S7001XA Contusion of right hip, initial encounter: Secondary | ICD-10-CM | POA: Diagnosis not present

## 2024-01-28 DIAGNOSIS — F330 Major depressive disorder, recurrent, mild: Secondary | ICD-10-CM

## 2024-01-28 MED ORDER — VILAZODONE HCL 10 MG PO TABS
10.0000 mg | ORAL_TABLET | Freq: Every day | ORAL | 0 refills | Status: DC
  Filled 2024-01-28: qty 10, 10d supply, fill #0

## 2024-01-28 MED ORDER — VILAZODONE HCL 10 MG PO TABS
ORAL_TABLET | ORAL | 0 refills | Status: DC
  Filled 2024-01-28: qty 50, 30d supply, fill #0

## 2024-01-28 MED ORDER — DULOXETINE HCL 30 MG PO CPEP
30.0000 mg | ORAL_CAPSULE | Freq: Every day | ORAL | 0 refills | Status: DC
  Filled 2024-01-28 (×2): qty 10, 10d supply, fill #0

## 2024-01-28 MED ORDER — AZITHROMYCIN 250 MG PO TABS
ORAL_TABLET | ORAL | 0 refills | Status: AC
  Filled 2024-01-28: qty 6, 5d supply, fill #0

## 2024-01-28 MED ORDER — VILAZODONE HCL 20 MG PO TABS
20.0000 mg | ORAL_TABLET | Freq: Every day | ORAL | 0 refills | Status: DC
  Filled 2024-01-28: qty 20, 20d supply, fill #0

## 2024-01-28 NOTE — Progress Notes (Signed)
 "  Subjective:  Patient ID: Cassandra Fox, female    DOB: August 18, 1955  Age: 69 y.o. MRN: [id number]  Chief Complaint  Patient presents with   Depression    Genesight Testing results    HPI Pt in today with uri symptoms - has had cough, congestion and pnd since last week - had been treated for bronchiectasis a few weeks ago and cleared but then symptoms recurred - denies fever - has had productive cough  Pt fell on 12/31 down steps hitting her right hip and landing on left side.  States she is not having any pain or trouble ambulating - does have bruising on her right hip and lower back  Pt with depression with anxiety - she is currently on cymbalta , buspar  and xanax .  Her anxiety is under controlled but she continues to feel depressed and overwhelmed - she did do genesight testing and will go over results today It does show both buspar  and xanax  are best tolerated for anxiety - Cymbalta  shows moderate interaction and she has not felt like that medication has helped too much - will switch to Viibryd  and do a taper dose off cymbalta  and steadily increase Viibryd      01/28/2024    3:27 PM 12/31/2023    3:46 PM 11/07/2023    1:51 PM 07/23/2023   10:46 AM 07/23/2023   10:30 AM  Depression screen PHQ 2/9  Decreased Interest 3 3 3 1 1   Down, Depressed, Hopeless 2 2 3 1 1   PHQ - 2 Score 5 5 6 2 2   Altered sleeping 3 3 3 1    Tired, decreased energy 2 2 2  0 0  Change in appetite 2 2 0 0 0  Feeling bad or failure about yourself  2 2 0 0 0  Trouble concentrating 0 0 0 0 0  Moving slowly or fidgety/restless 0 0 0 0 0  Suicidal thoughts 0 0 0 0 0  PHQ-9 Score 14 14 11  3     Difficult doing work/chores Very difficult Somewhat difficult Somewhat difficult Not difficult at all Not difficult at all     Data saved with a previous flowsheet row definition        11/19/2022    9:57 AM 03/22/2023   10:16 AM 04/15/2023    2:57 PM 07/23/2023   10:30 AM 11/07/2023    1:51 PM  Fall Risk  Falls in the  past year? 0 1 1 1  0  Was there an injury with Fall? 0  0  0  0  0   Fall Risk Category Calculator 0 1 1 1  0  Patient at Risk for Falls Due to No Fall Risks No Fall Risks No Fall Risks History of fall(s) No Fall Risks  Fall risk Follow up Falls evaluation completed Falls evaluation completed Falls evaluation completed;Education provided  Falls evaluation completed     Data saved with a previous flowsheet row definition     CONSTITUTIONAL: Negative for chills, fatigue, fever,  E/N/T: Negative for ear pain, nasal congestion and sore throat.  CARDIOVASCULAR: Negative for chest pain, dizziness, palpitations and pedal edema.  RESPIRATORY: Negative for recent cough and dyspnea.  GASTROINTESTINAL: Negative for abdominal pain, acid reflux symptoms, constipation, diarrhea, nausea and vomiting.  MSK: Negative for arthralgias and myalgias.  PSYCHIATRIC: Negative for sleep disturbance and to question depression screen.  Negative for depression, negative for anhedonia.       Current Medications[1]  Past Medical History:  Diagnosis Date  Abnormal laboratory test 05/16/2022   Anxiety    Aortic atherosclerosis 11/19/2022   Benign hypertension 03/18/2019   BMI 26.0-26.9,adult 08/03/2020   Breast mass, right    Bursitis of right shoulder 11/14/2020   Coronary artery calcification 11/19/2022   Depression    Diabetes mellitus without complication (HCC)    Hyperlipidemia    Hyperlipidemia associated with type 2 diabetes mellitus (HCC) 11/19/2022   Hypertension    Hypothyroidism    Hypothyroidism, adult 03/18/2019   LLQ pain 01/10/2022   Major depressive disorder, recurrent episode, mild 03/18/2019   Mixed hyperlipidemia 03/18/2019   Need for vaccination for pneumococcus 11/19/2022   Neuropathy    Osteopenia of neck of right femur 07/23/2023   Peripheral vascular disease    RMSF [facility] spotted fever) 07/10/2020   Thrombocytopenia 11/19/2022   Type 2 diabetes mellitus with  hyperlipidemia (HCC) 03/18/2019   Vitamin D  deficiency    Vitamin D  insufficiency 03/01/2022   Objective:  PHYSICAL EXAM:   VS: BP 132/60   Pulse 73   Temp (!) 97.2 F (36.2 C) (Temporal)   Resp 18   Ht 5' 4 (1.626 m)   Wt 164 lb 3.2 oz (74.5 kg)   SpO2 97%   BMI 28.18 kg/m   GEN: Well nourished, well developed, in no acute distress  HEENT: normal external ears and nose - normal external auditory canals and TMS -- Lips, Teeth and Gums - normal  Oropharynx - erythema/pnd Cardiac: RRR; no murmurs, rubs, Respiratory:  normal respiratory rate and pattern with no distress - normal breath sounds with no rales, rhonchi, wheezes or rubs Skin: warm and dry, no rash  Neuro:  Alert and Oriented x 3,  - CN II-Xii grossly intact Psych: euthymic mood, appropriate affect and demeanor  Assessment & Plan:    Acute laryngopharyngitis -     Azithromycin ; Take 2 tablets (500 mg total) by mouth daily for 1 day, THEN 1 tablet (250 mg total) daily for 4 days.  Dispense: 6 tablet; Refill: 0 Recommend stop smoking Major depressive disorder, recurrent episode, mild -     Vilazodone  HCl; Take 1 tablet (10 mg total) by mouth daily for 10 days, THEN 2 tablets (20 mg total) daily for 20 days.  Dispense: 50 tablet; Refill: 0 -     DULoxetine  HCl; Take 1 capsule (30 mg total) by mouth daily.  Dispense: 10 capsule; Refill: 0 Continue buspar  and xanax  as prescribed Contusion of right hip, initial encounter Tylenol  as needed    Follow-up: Return in about 4 weeks (around 02/25/2024) for follow-up.  An After Visit Summary was printed and given to the patient.  SARA R Kimari Lienhard, PA-C [facility] [phone]    [1]  Current Outpatient Medications:    ALPRAZolam  (XANAX ) 0.5 MG tablet, Take 1 tablet (0.5 mg total) by mouth 2 (two) times daily as needed for anxiety., Disp: 60 tablet, Rfl: 1   aspirin EC 81 MG tablet, Take 81 mg by mouth daily., Disp: , Rfl:    azithromycin  (ZITHROMAX ) 250 MG tablet,  Take 2 tablets (500 mg total) by mouth daily for 1 day, THEN 1 tablet (250 mg total) daily for 4 days., Disp: 6 tablet, Rfl: 0   budesonide -glycopyrrolate -formoterol  (BREZTRI  AEROSPHERE) 160-9-4.8 MCG/ACT AERO inhaler, Inhale 2 puffs into the lungs 2 (two) times daily., Disp: 10.7 g, Rfl: 5   busPIRone  (BUSPAR ) 5 MG tablet, Take 1 tablet (5 mg total) by mouth 2 (two) times daily., Disp: 60 tablet, Rfl:  3   DULoxetine  (CYMBALTA ) 30 MG capsule, Take 1 capsule (30 mg total) by mouth daily., Disp: 10 capsule, Rfl: 0   gabapentin  (NEURONTIN ) 300 MG capsule, Take 1 capsule (300 mg total) by mouth at bedtime., Disp: 30 capsule, Rfl: 1   gatifloxacin  (ZYMAXID ) 0.5 % SOLN, Place 1 drop into the left eye 4 (four) times daily as directed. Store upside down!, Disp: 5 mL, Rfl: 1   glucose blood (ONETOUCH VERIO) test strip, USE TO CHECK BLOOD GLUCOSE TWICE DAILY, Disp: 100 strip, Rfl: 4   ketorolac  (ACULAR ) 0.5 % ophthalmic solution, Place 1 drop into the left eye 2 (two) times daily as directed, Disp: 10 mL, Rfl: 1   levothyroxine  (SYNTHROID ) 88 MCG tablet, Take 1 tablet (88 mcg total) by mouth daily., Disp: 90 tablet, Rfl: 3   loratadine (CLARITIN) 10 MG tablet, , Disp: , Rfl:    losartan -hydrochlorothiazide  (HYZAAR ) 100-25 MG tablet, Take 1 tablet by mouth daily., Disp: 90 tablet, Rfl: 0   metFORMIN  (GLUCOPHAGE ) 500 MG tablet, Take 1 tablet (500 mg total) by mouth 2 (two) times daily., Disp: 180 tablet, Rfl: 1   metoprolol  tartrate (LOPRESSOR ) 25 MG tablet, Take 1 tablet (25 mg total) by mouth daily., Disp: 90 tablet, Rfl: 1   Multiple Vitamins-Minerals (OCUVITE PRESERVISION PO), Take 1 tablet by mouth daily., Disp: , Rfl:    Omega-3 Fatty Acids (FISH OIL) 1000 MG CAPS, Take 1,000 mg by mouth 2 (two) times daily., Disp: , Rfl:    omeprazole  (PRILOSEC) 40 MG capsule, Take 1 capsule (40 mg total) by mouth daily., Disp: 30 capsule, Rfl: 3   Probiotic Product (ALIGN PO), Take by mouth., Disp: , Rfl:     rosuvastatin  (CRESTOR ) 10 MG tablet, Take 1 tablet (10 mg total) by mouth daily., Disp: 90 tablet, Rfl: 0   Vilazodone  HCl (VIIBRYD ) 10 MG TABS, Take 1 tablet (10 mg total) by mouth daily for 10 days, THEN 2 tablets (20 mg total) daily for 20 days., Disp: 50 tablet, Rfl: 0   Vitamin D , Cholecalciferol, 1000 units CAPS, Take 1 tablet by mouth daily., Disp: , Rfl:    doxycycline  (VIBRA -TABS) 100 MG tablet, Take 1 tablet (100 mg total) by mouth 2 (two) times daily., Disp: 20 tablet, Rfl: 0  "

## 2024-01-29 ENCOUNTER — Encounter: Payer: Self-pay | Admitting: Physician Assistant

## 2024-01-29 ENCOUNTER — Other Ambulatory Visit: Payer: Self-pay | Admitting: Physician Assistant

## 2024-01-29 DIAGNOSIS — F330 Major depressive disorder, recurrent, mild: Secondary | ICD-10-CM

## 2024-01-30 ENCOUNTER — Other Ambulatory Visit (HOSPITAL_BASED_OUTPATIENT_CLINIC_OR_DEPARTMENT_OTHER): Payer: Self-pay

## 2024-01-30 ENCOUNTER — Other Ambulatory Visit: Payer: Self-pay | Admitting: Physician Assistant

## 2024-01-30 ENCOUNTER — Other Ambulatory Visit: Payer: Self-pay

## 2024-02-03 ENCOUNTER — Encounter: Payer: Self-pay | Admitting: Physician Assistant

## 2024-02-04 ENCOUNTER — Ambulatory Visit: Admitting: Physician Assistant

## 2024-02-04 ENCOUNTER — Other Ambulatory Visit: Payer: Self-pay | Admitting: Physician Assistant

## 2024-02-04 ENCOUNTER — Encounter: Payer: Self-pay | Admitting: Physician Assistant

## 2024-02-05 ENCOUNTER — Encounter: Payer: Self-pay | Admitting: Physician Assistant

## 2024-02-06 ENCOUNTER — Other Ambulatory Visit (HOSPITAL_BASED_OUTPATIENT_CLINIC_OR_DEPARTMENT_OTHER): Payer: Self-pay

## 2024-02-10 ENCOUNTER — Other Ambulatory Visit (HOSPITAL_BASED_OUTPATIENT_CLINIC_OR_DEPARTMENT_OTHER): Payer: Self-pay

## 2024-02-10 ENCOUNTER — Encounter: Payer: Self-pay | Admitting: Physician Assistant

## 2024-02-11 ENCOUNTER — Encounter: Payer: Self-pay | Admitting: Physician Assistant

## 2024-02-12 ENCOUNTER — Encounter: Payer: Self-pay | Admitting: Physician Assistant

## 2024-02-12 ENCOUNTER — Other Ambulatory Visit: Payer: Self-pay | Admitting: Physician Assistant

## 2024-02-12 ENCOUNTER — Other Ambulatory Visit (HOSPITAL_BASED_OUTPATIENT_CLINIC_OR_DEPARTMENT_OTHER): Payer: Self-pay

## 2024-02-12 DIAGNOSIS — I152 Hypertension secondary to endocrine disorders: Secondary | ICD-10-CM

## 2024-02-12 MED ORDER — METOPROLOL SUCCINATE ER 50 MG PO TB24
50.0000 mg | ORAL_TABLET | Freq: Every day | ORAL | 0 refills | Status: AC
  Filled 2024-02-12: qty 90, 90d supply, fill #0

## 2024-02-14 ENCOUNTER — Other Ambulatory Visit (HOSPITAL_BASED_OUTPATIENT_CLINIC_OR_DEPARTMENT_OTHER): Payer: Self-pay

## 2024-02-14 ENCOUNTER — Encounter: Payer: Self-pay | Admitting: Physician Assistant

## 2024-02-19 ENCOUNTER — Other Ambulatory Visit (HOSPITAL_BASED_OUTPATIENT_CLINIC_OR_DEPARTMENT_OTHER): Payer: Self-pay

## 2024-02-19 ENCOUNTER — Other Ambulatory Visit: Payer: Self-pay

## 2024-02-24 ENCOUNTER — Ambulatory Visit: Admitting: Physician Assistant

## 2024-02-25 ENCOUNTER — Ambulatory Visit: Admitting: Physician Assistant

## 2024-02-25 ENCOUNTER — Ambulatory Visit

## 2024-02-26 ENCOUNTER — Other Ambulatory Visit: Payer: Self-pay

## 2024-02-26 ENCOUNTER — Other Ambulatory Visit (HOSPITAL_BASED_OUTPATIENT_CLINIC_OR_DEPARTMENT_OTHER): Payer: Self-pay

## 2024-02-26 ENCOUNTER — Encounter: Payer: Self-pay | Admitting: Physician Assistant

## 2024-02-26 DIAGNOSIS — E1169 Type 2 diabetes mellitus with other specified complication: Secondary | ICD-10-CM

## 2024-02-26 MED ORDER — VILAZODONE HCL 20 MG PO TABS
20.0000 mg | ORAL_TABLET | Freq: Every day | ORAL | 0 refills | Status: DC
  Filled 2024-02-26: qty 20, 20d supply, fill #0

## 2024-02-26 MED ORDER — ROSUVASTATIN CALCIUM 10 MG PO TABS
10.0000 mg | ORAL_TABLET | Freq: Every day | ORAL | 0 refills | Status: AC
  Filled 2024-02-26: qty 90, 90d supply, fill #0

## 2024-02-27 ENCOUNTER — Other Ambulatory Visit (HOSPITAL_BASED_OUTPATIENT_CLINIC_OR_DEPARTMENT_OTHER): Payer: Self-pay

## 2024-02-27 ENCOUNTER — Other Ambulatory Visit: Payer: Self-pay | Admitting: Physician Assistant

## 2024-02-27 ENCOUNTER — Encounter: Payer: Self-pay | Admitting: Physician Assistant

## 2024-02-27 ENCOUNTER — Encounter: Payer: Self-pay | Admitting: Cardiology

## 2024-02-27 ENCOUNTER — Ambulatory Visit

## 2024-02-27 ENCOUNTER — Ambulatory Visit: Admitting: Physician Assistant

## 2024-02-27 ENCOUNTER — Ambulatory Visit: Admitting: Cardiology

## 2024-02-27 VITALS — BP 162/54 | HR 68 | Ht 64.0 in | Wt 164.1 lb

## 2024-02-27 VITALS — BP 162/60 | HR 70 | Temp 98.0°F | Resp 18 | Ht 64.0 in | Wt 163.6 lb

## 2024-02-27 DIAGNOSIS — M25511 Pain in right shoulder: Secondary | ICD-10-CM | POA: Diagnosis not present

## 2024-02-27 DIAGNOSIS — E871 Hypo-osmolality and hyponatremia: Secondary | ICD-10-CM

## 2024-02-27 DIAGNOSIS — E1159 Type 2 diabetes mellitus with other circulatory complications: Secondary | ICD-10-CM | POA: Diagnosis not present

## 2024-02-27 DIAGNOSIS — E039 Hypothyroidism, unspecified: Secondary | ICD-10-CM

## 2024-02-27 DIAGNOSIS — I422 Other hypertrophic cardiomyopathy: Secondary | ICD-10-CM | POA: Diagnosis not present

## 2024-02-27 DIAGNOSIS — G8929 Other chronic pain: Secondary | ICD-10-CM | POA: Diagnosis not present

## 2024-02-27 DIAGNOSIS — I2510 Atherosclerotic heart disease of native coronary artery without angina pectoris: Secondary | ICD-10-CM

## 2024-02-27 DIAGNOSIS — I152 Hypertension secondary to endocrine disorders: Secondary | ICD-10-CM | POA: Diagnosis not present

## 2024-02-27 DIAGNOSIS — F330 Major depressive disorder, recurrent, mild: Secondary | ICD-10-CM

## 2024-02-27 DIAGNOSIS — Z7984 Long term (current) use of oral hypoglycemic drugs: Secondary | ICD-10-CM | POA: Diagnosis not present

## 2024-02-27 DIAGNOSIS — I10 Essential (primary) hypertension: Secondary | ICD-10-CM

## 2024-02-27 DIAGNOSIS — F419 Anxiety disorder, unspecified: Secondary | ICD-10-CM | POA: Diagnosis not present

## 2024-02-27 DIAGNOSIS — R000 Tachycardia, unspecified: Secondary | ICD-10-CM

## 2024-02-27 DIAGNOSIS — E785 Hyperlipidemia, unspecified: Secondary | ICD-10-CM | POA: Diagnosis not present

## 2024-02-27 DIAGNOSIS — E1169 Type 2 diabetes mellitus with other specified complication: Secondary | ICD-10-CM

## 2024-02-27 DIAGNOSIS — C801 Malignant (primary) neoplasm, unspecified: Secondary | ICD-10-CM | POA: Insufficient documentation

## 2024-02-27 DIAGNOSIS — I421 Obstructive hypertrophic cardiomyopathy: Secondary | ICD-10-CM

## 2024-02-27 NOTE — Assessment & Plan Note (Signed)
 Continue current meds.

## 2024-02-27 NOTE — Assessment & Plan Note (Signed)
 Continue synthroid  Orders:   TSH

## 2024-02-27 NOTE — Progress Notes (Signed)
 "  Subjective:  Patient ID: Cassandra Fox, female    DOB: [birth year]  Age: 69 y.o. MRN: [id number]  Chief Complaint  Patient presents with   Depression    HPI Pt in today for follow up of anxiety with depression.  She is currently taking vilazodone  20mg  qd and uses xanax  as needed.  She states overall she is feeling much better and not having a lot of breakthrough symptoms.  Much improved on this regimen Pt states she still has occasional anxiety and has been noting increased heart rate and heart racing at times.  She is unsure if this could be related to her anxiety symptoms.  Pt has had event monitor in the past but it has been about a year ago.  Pt with hypertension.  Today bp is elevated at 150/58 but had been running well at last several visits.  Denies chest pain or dyspnea She is currently taking metoprolol  50mg  qd and losartan  / hctz 100/25  Pt complains of chronic right shoulder pain.  States it is sore to lay on at times at night and is able to extend arm but with some stiffness and decreased rom  Pt with hypothyroidism - is due to repeat TSH - currently taking synthroid  qd     01/28/2024    3:27 PM 12/31/2023    3:46 PM 11/07/2023    1:51 PM 07/23/2023   10:46 AM 07/23/2023   10:30 AM  Depression screen PHQ 2/9  Decreased Interest 3 3 3 1 1   Down, Depressed, Hopeless 2 2 3 1 1   PHQ - 2 Score 5 5 6 2 2   Altered sleeping 3 3 3 1    Tired, decreased energy 2 2 2  0 0  Change in appetite 2 2 0 0 0  Feeling bad or failure about yourself  2 2 0 0 0  Trouble concentrating 0 0 0 0 0  Moving slowly or fidgety/restless 0 0 0 0 0  Suicidal thoughts 0 0 0 0 0  PHQ-9 Score 14 14 11  3     Difficult doing work/chores Very difficult Somewhat difficult Somewhat difficult Not difficult at all Not difficult at all     Data saved with a previous flowsheet row definition        11/19/2022    9:57 AM 03/22/2023   10:16 AM 04/15/2023    2:57 PM 07/23/2023   10:30 AM 11/07/2023    1:51  PM  Fall Risk  Falls in the past year? 0 1 1 1  0  Was there an injury with Fall? 0  0  0  0  0   Fall Risk Category Calculator 0 1 1 1  0  Patient at Risk for Falls Due to No Fall Risks No Fall Risks No Fall Risks History of fall(s) No Fall Risks  Fall risk Follow up Falls evaluation completed Falls evaluation completed Falls evaluation completed;Education provided  Falls evaluation completed     Data saved with a previous flowsheet row definition     ROS CONSTITUTIONAL: Negative for chills, fatigue, fever, unintentional weight gain and unintentional weight loss.  E/N/T: Negative for ear pain, nasal congestion and sore throat.  CARDIOVASCULAR: see HPI RESPIRATORY: Negative for recent cough and dyspnea.  MSK: see HPI INTEGUMENTARY: Negative for rash.  PSYCHIATRIC: Negative for sleep disturbance and to question depression screen.  Negative for depression, negative for anhedonia.   Current Medications[1]  Past Medical History:  Diagnosis Date   Abnormal laboratory test 05/16/2022  Anxiety    Aortic atherosclerosis 11/19/2022   Benign hypertension 03/18/2019   BMI 26.0-26.9,adult 08/03/2020   Breast mass, right    Bursitis of right shoulder 11/14/2020   Cancer (HCC)    Skin   Coronary artery calcification 11/19/2022   Depression    Diabetes mellitus without complication (HCC)    Hyperlipidemia    Hyperlipidemia associated with type 2 diabetes mellitus (HCC) 11/19/2022   Hypertension    Hypothyroidism    Hypothyroidism, adult 03/18/2019   LLQ pain 01/10/2022   Major depressive disorder, recurrent episode, mild 03/18/2019   Mixed hyperlipidemia 03/18/2019   Need for vaccination for pneumococcus 11/19/2022   Neuropathy    Osteopenia of neck of right femur 07/23/2023   Peripheral vascular disease    RMS[facility] spotted fever) 07/10/2020   Thrombocytopenia 11/19/2022   Type 2 diabetes mellitus with hyperlipidemia (HCC) 03/18/2019   Vitamin D  deficiency    Vitamin D   insufficiency 03/01/2022   Objective:  PHYSICAL EXAM:   BP (!) 162/60   Pulse 70   Temp 98 F (36.7 C) (Temporal)   Resp 18   Ht 5' 4 (1.626 m)   Wt 163 lb 9.6 oz (74.2 kg)   SpO2 97%   BMI 28.08 kg/m    GEN: Well nourished, well developed, in no acute distress  Cardiac: RRR; no murmurs, rubs, or gallops,no edema -  Respiratory:  normal respiratory rate and pattern with no distress - normal breath sounds with no rales, rhonchi, wheezes or rubs MS: no deformity or atrophy - decreased rom of right shoulder Skin: warm and dry, no rash  Neuro:  Alert and Oriented x 3, - CN II-Xii grossly intact Psych: euthymic mood, appropriate affect and demeanor   EKG - LVH noted and prolonged QT Assessment & Plan Hypothyroidism, adult Continue synthroid  Orders:   TSH  Major depressive disorder, recurrent episode, mild Continue current meds    Anxiety Continue current meds    Tachycardia Labwork pending Recommend to make cardiology follow up appt Orders:   EKG 12-Lead   CBC with Differential/Platelet   Comprehensive metabolic panel with GFR  Chronic right shoulder pain Recommend ibuprofen 400mg  bid Rom exercises given Consider ortho appt if symptoms persist or worsen    Hypertension associated with diabetes (HCC) Continue current meds Monitor bp Follow up with cardiology      Follow-up: Return for as scheduled for next chronic visit.  An After Visit Summary was printed and given to the patient.  SARA R Nirel Babler, PA-C [facility] [phone]    [1]  Current Outpatient Medications:    ALPRAZolam  (XANAX ) 0.5 MG tablet, Take 1 tablet (0.5 mg total) by mouth 2 (two) times daily as needed for anxiety., Disp: 60 tablet, Rfl: 1   aspirin EC 81 MG tablet, Take 81 mg by mouth daily., Disp: , Rfl:    budesonide -glycopyrrolate -formoterol  (BREZTRI  AEROSPHERE) 160-9-4.8 MCG/ACT AERO inhaler, Inhale 2 puffs into the lungs 2 (two) times daily., Disp: 10.7 g, Rfl: 5    Famotidine (PEPCID PO), Take by mouth., Disp: , Rfl:    gabapentin  (NEURONTIN ) 300 MG capsule, Take 1 capsule (300 mg total) by mouth at bedtime., Disp: 30 capsule, Rfl: 1   gatifloxacin  (ZYMAXID ) 0.5 % SOLN, Place 1 drop into the left eye 4 (four) times daily as directed. Store upside down!, Disp: 5 mL, Rfl: 1   glucose blood (ONETOUCH VERIO) test strip, USE TO CHECK BLOOD GLUCOSE TWICE DAILY, Disp: 100 strip, Rfl: 4  ketorolac  (ACULAR ) 0.5 % ophthalmic solution, Place 1 drop into the left eye 2 (two) times daily as directed, Disp: 10 mL, Rfl: 1   levothyroxine  (SYNTHROID ) 88 MCG tablet, Take 1 tablet (88 mcg total) by mouth daily., Disp: 90 tablet, Rfl: 3   loratadine (CLARITIN) 10 MG tablet, , Disp: , Rfl:    losartan -hydrochlorothiazide  (HYZAAR ) 100-25 MG tablet, Take 1 tablet by mouth daily., Disp: 90 tablet, Rfl: 0   metFORMIN  (GLUCOPHAGE ) 500 MG tablet, Take 1 tablet (500 mg total) by mouth 2 (two) times daily., Disp: 180 tablet, Rfl: 1   metoprolol  succinate (TOPROL -XL) 50 MG 24 hr tablet, Take 1 tablet (50 mg total) by mouth daily. Take with or immediately following a meal., Disp: 90 tablet, Rfl: 0   Multiple Vitamins-Minerals (OCUVITE PRESERVISION PO), Take 1 tablet by mouth daily., Disp: , Rfl:    Omega-3 Fatty Acids (FISH OIL) 1000 MG CAPS, Take 1,000 mg by mouth 2 (two) times daily., Disp: , Rfl:    Probiotic Product (ALIGN PO), Take by mouth., Disp: , Rfl:    rosuvastatin  (CRESTOR ) 10 MG tablet, Take 1 tablet (10 mg total) by mouth daily., Disp: 90 tablet, Rfl: 0   Vilazodone  HCl 20 MG TABS, Take 1 tablet (20 mg total) by mouth daily. Start 20 mg rx after taking 10 mg rx for 10 days., Disp: 20 tablet, Rfl: 0   Vitamin D , Cholecalciferol, 1000 units CAPS, Take 1 tablet by mouth daily., Disp: , Rfl:   "

## 2024-02-27 NOTE — Patient Instructions (Signed)
 Medication Instructions:  Your physician recommends that you continue on your current medications as directed. Please refer to the Current Medication list given to you today.  *If you need a refill on your cardiac medications before your next appointment, please call your pharmacy*  Lab Work: None If you have labs (blood work) drawn today and your tests are completely normal, you will receive your results only by: MyChart Message (if you have MyChart) OR A paper copy in the mail If you have any lab test that is abnormal or we need to change your treatment, we will call you to review the results.  Testing/Procedures: A zio monitor was ordered today. It will remain on for 14 days. You will then return monitor and event diary in provided box. It takes 1-2 weeks for report to be downloaded and returned to us . We will call you with the results. If monitor falls off or has orange flashing light, please call Zio for further instructions.   Cardiac MRI of the heart  Follow-Up: At [facility], you and your health needs are our priority.  As part of our continuing mission to provide you with exceptional heart care, our providers are all part of one team.  This team includes your primary Cardiologist (physician) and Advanced Practice Providers or APPs (Physician Assistants and Nurse Practitioners) who all work together to provide you with the care you need, when you need it.  Your next appointment:   6 week(s)  Provider:   Redell Leiter, MD    We recommend signing up for the patient portal called MyChart.  Sign up information is provided on this After Visit Summary.  MyChart is used to connect with patients for Virtual Visits (Telemedicine).  Patients are able to view lab/test results, encounter notes, upcoming appointments, etc.  Non-urgent messages can be sent to your provider as well.   To learn more about what you can do with MyChart, go to [email or web address].   Other  Instructions None

## 2024-02-27 NOTE — Progress Notes (Signed)
 " Cardiology Office Note:    Date:  02/27/2024   ID:  Cassandra Fox, DOB [birth year], MRN [id number]  PCP:  Cassandra Credit, PA-C  Cardiologist:  Cassandra Leiter, MD    Referring MD: Cassandra Credit, PA-C    ASSESSMENT:    1. Apical variant hypertrophic cardiomyopathy (HCC)   2. Essential hypertension   3. Hyponatremia   4. Coronary artery calcification   5. Type 2 diabetes mellitus with hyperlipidemia (HCC)    PLAN:    In order of problems listed above:  After an urgent evaluation in our office including EKG vital signs orthostatics review of her medical records and review of her CGM I do not have a discrete reason why she felt so weak in my office and spontaneously recovered.  She did not have bradycardia hypotension or hypoglycemia. She has the typical EKG pattern of apical variant hypertrophic cardiomyopathy unchanged from previous no arrhythmia She does have some palpitation were going to take this as an opportunity to monitor with a live ZIO 2 weeks Also reassess with a cardiac MRI looking for high risk markers like apical aneurysm or LGE that would change our approach She is drinking 64 ounces of water a day has mild hyponatremia and I told her that she should reduce that to one half. Continue her lipid-lowering therapy   Next appointment: 6 weeks   Medication Adjustments/Labs and Tests Ordered: Current medicines are reviewed at length with the patient today.  Concerns regarding medicines are outlined above.  Orders Placed This Encounter  Procedures   MR CARDIAC MORPHOLOGY W WO CONTRAST   LONG TERM MONITOR-LIVE TELEMETRY (3-14 DAYS)   EKG 12-Lead   No orders of the defined types were placed in this encounter.    History of Present Illness:    Cassandra Fox is a 69 y.o. female with a hx of apical hypertrophic cardiomyopathy last seen 08/28/2023.  Cardiac CTA November 2024 showed score of 11 and minimal plaque in the proximal LAD diagonal and circumflex without stenosis.   I had seen her February 2025 for apical variant hypertrophic cardiomyopathy hypertension hyperlipidemia type 2 diabetes.  She was asymptomatic with her cardiomyopathy she had no family history of sudden cardiac death.  Recent results 2 months ago showed hyponatremia with a sodium 132.  This has been seen intermittently previously  She saw her family doctor today for follow-up for anxiety and depression and blood pressure was elevated..  There is a notation that her heart rate was rapid although was recorded at 70 bpm and was told to see cardiology.  Told she had an EKG performed but it cannot be seen in the medical record.  She arrived to my office and found it emotionally stressful she felt weak and came out into the hallway we brought her into the room placed her supine normal vital signs no orthostatic drop in blood pressure blood sugar with her CGM is in the range of 100 and her EKG shows no arrhythmia. She is concerned about being hypotensive but she has not been checking her blood pressure She is concerned about tachycardia but I do not see it documented. She does have underlying nonobstructive apical hypertrophic cardiomyopathy and has had palpitation. We we will apply a live monitor for 2 weeks and after reviewing her records we will go ahead and do a cardiac MRI that we considered before looking for high risk markings particularly apical aneurysm or extensive LGE. I gave her a copy of the EKG and again  told her that her children need to be screened. Fortunately she is not having syncope chest pain or shortness of breath.  Compliance with diet, lifestyle and medications: Yes Past Medical History:  Diagnosis Date   Abnormal laboratory test 05/16/2022   Anxiety    Aortic atherosclerosis 11/19/2022   Benign hypertension 03/18/2019   BMI 26.0-26.9,adult 08/03/2020   Breast mass, right    Bursitis of right shoulder 11/14/2020   Cancer (HCC)    Skin   Coronary artery calcification  11/19/2022   Depression    Diabetes mellitus without complication (HCC)    Hyperlipidemia    Hyperlipidemia associated with type 2 diabetes mellitus (HCC) 11/19/2022   Hypertension    Hypothyroidism    Hypothyroidism, adult 03/18/2019   LLQ pain 01/10/2022   Major depressive disorder, recurrent episode, mild 03/18/2019   Mixed hyperlipidemia 03/18/2019   Need for vaccination for pneumococcus 11/19/2022   Neuropathy    Osteopenia of neck of right femur 07/23/2023   Peripheral vascular disease    [facility] spotted fever) 07/10/2020   Thrombocytopenia 11/19/2022   Type 2 diabetes mellitus with hyperlipidemia (HCC) 03/18/2019   Vitamin D  deficiency    Vitamin D  insufficiency 03/01/2022    Current Medications: Active Medications[1]    EKGs/Labs/Other Studies Reviewed:    The following studies were reviewed today:  Cardiac Studies & Procedures   ______________________________________________________________________________________________     ECHOCARDIOGRAM  ECHOCARDIOGRAM COMPLETE 01/03/2023  Narrative ECHOCARDIOGRAM REPORT    Patient Name:   Cassandra Fox Date of Exam: 01/03/2023 Medical Rec #:  [id number]         Height:       64.0 in Accession #:    [id number]        Weight:       160.0 lb Date of Birth:  [birth year]         BSA:          1.779 m Patient Age:    67 years          BP:           112/68 mmHg Patient Gender: F                 HR:           54 bpm. Exam Location:  [place]  Procedure: 2D Echo, Cardiac Doppler, Color Doppler and Strain Analysis  Indications:    Coronary artery calcification [I25.10 (ICD-10-CM)]  History:        Patient has no prior history of Echocardiogram examinations. CAD, Abnormal ECG; Risk Factors:Hypertension, Diabetes and Dyslipidemia.  Sonographer:    Cassandra Fox RDCS Referring Phys: [id number] Cassandra Fox  IMPRESSIONS   1. Apical views consistent with apical hypertrophic cardiomyopathy 16-18 mm .  Left ventricular ejection fraction, by estimation, is 60 to 65%. The left ventricle has normal function. The left ventricle has no regional wall motion abnormalities. There is severe asymmetric left ventricular hypertrophy. Left ventricular diastolic parameters were normal. 2. Right ventricular systolic function is normal. The right ventricular size is normal. There is normal pulmonary artery systolic pressure. 3. The mitral valve is normal in structure. Mild mitral valve regurgitation. No evidence of mitral stenosis. 4. The aortic valve is tricuspid. Aortic valve regurgitation is not visualized. No aortic stenosis is present. 5. The inferior vena cava is normal in size with greater than 50% respiratory variability, suggesting right atrial pressure of 3 mmHg.  FINDINGS Left Ventricle: Apical views consistent with apical hypertrophic cardiomyopathy  16-18 mm. Left ventricular ejection fraction, by estimation, is 60 to 65%. The left ventricle has normal function. The left ventricle has no regional wall motion abnormalities. The left ventricular internal cavity size was normal in size. There is severe asymmetric left ventricular hypertrophy. Left ventricular diastolic parameters were normal.  Right Ventricle: The right ventricular size is normal. No increase in right ventricular wall thickness. Right ventricular systolic function is normal. There is normal pulmonary artery systolic pressure. The tricuspid regurgitant velocity is 2.61 m/s, and with an assumed right atrial pressure of 3 mmHg, the estimated right ventricular systolic pressure is 30.2 mmHg.  Left Atrium: Left atrial size was normal in size.  Right Atrium: Right atrial size was normal in size.  Pericardium: There is no evidence of pericardial effusion.  Mitral Valve: The mitral valve is normal in structure. Mild mitral valve regurgitation. No evidence of mitral valve stenosis.  Tricuspid Valve: The tricuspid valve is normal in  structure. Tricuspid valve regurgitation is mild . No evidence of tricuspid stenosis.  Aortic Valve: The aortic valve is tricuspid. Aortic valve regurgitation is not visualized. No aortic stenosis is present.  Pulmonic Valve: The pulmonic valve was normal in structure. Pulmonic valve regurgitation is not visualized. No evidence of pulmonic stenosis.  Aorta: The aortic root is normal in size and structure and the aortic root, ascending aorta, aortic arch and descending aorta are all structurally normal, with no evidence of dilitation or obstruction.  Venous: A normal flow pattern is recorded from the right upper pulmonary vein. The inferior vena cava is normal in size with greater than 50% respiratory variability, suggesting right atrial pressure of 3 mmHg.  IAS/Shunts: No atrial level shunt detected by color flow Doppler.   LEFT VENTRICLE PLAX 2D LVIDd:         5.40 cm   Diastology LVIDs:         3.00 cm   LV e' medial:    6.64 cm/s LV PW:         0.90 cm   LV E/e' medial:  12.0 LV IVS:        1.10 cm   LV e' lateral:   7.72 cm/s LVOT diam:     1.90 cm   LV E/e' lateral: 10.3 LV SV:         78 LV SV Index:   44 LVOT Area:     2.84 cm   RIGHT VENTRICLE             IVC RV Basal diam:  3.20 cm     IVC diam: 1.90 cm RV Mid diam:    2.60 cm RV S prime:     14.10 cm/s TAPSE (M-mode): 2.8 cm  LEFT ATRIUM             Index        RIGHT ATRIUM           Index LA diam:        3.30 cm 1.85 cm/m   RA Area:     15.10 cm LA Vol (A2C):   49.1 ml 27.59 ml/m  RA Volume:   36.50 ml  20.51 ml/m LA Vol (A4C):   66.3 ml 37.26 ml/m LA Biplane Vol: 58.8 ml 33.05 ml/m AORTIC VALVE LVOT Vmax:   121.33 cm/s LVOT Vmean:  76.833 cm/s LVOT VTI:    0.276 m  AORTA Ao Root diam: 3.20 cm Ao Asc diam:  2.90 cm Ao Desc diam: 1.90 cm  MITRAL  VALVE               TRICUSPID VALVE MV Area (PHT): 3.31 cm    TR Peak grad:   27.2 mmHg MV Decel Time: 229 msec    TR Vmax:        261.00 cm/s MR Peak grad:  84.6 mmHg MR Vmax:      460.00 cm/s  SHUNTS MV E velocity: 79.70 cm/s  Systemic VTI:  0.28 m MV A velocity: 62.40 cm/s  Systemic Diam: 1.90 cm MV E/A ratio:  1.28  Cassandra Leiter MD Electronically signed by Cassandra Leiter MD Signature Date/Time: 01/03/2023/3:21:10 PM    Final    MONITORS  LONG TERM MONITOR (3-14 DAYS) 01/29/2023  Narrative Patch Wear Time:  8 days and 1 hours (2024-12-26T15:31:15-0500 to 2025-01-03T16:38:42-0500)  Patient had a min HR of 48 bpm, max HR of 146 bpm, and avg HR of 61 bpm. Predominant underlying rhythm was Sinus Rhythm.  There was 1 diary event of fluttering with sinus rhythm.  There were no pauses of 3 seconds or greater no episodes of second or third-degree AV nodal block.  There were no episodes of atrial fibrillation or flutter.  Brief runs of APCs were seen longest 8 complexes representing atrial tachycardia.  There was a single 4 beat run of PVCs was seen at a relatively slow rate 112 bpm.  SVE Couplets were rare (<1.0%), and SVE Triplets were rare (<1.0%).  Isolated VEs were rare (<1.0%), and no VE Couplets or VE Triplets were present.   CT SCANS  CT CORONARY MORPH W/CTA COR W/SCORE 12/17/2022  Addendum 01/01/2023  1:58 AM ADDENDUM REPORT: 01/01/2023 01:56  EXAM: OVER-READ INTERPRETATION  CT CHEST  The following report is an over-read performed by radiologist Dr. Oneil Devonshire of [facility] on 01/01/2023. This over-read does not include interpretation of cardiac or coronary anatomy or pathology. The coronary calcium  score/coronary CTA interpretation by the cardiologist is attached.  COMPARISON:  None.  FINDINGS: Cardiovascular: There are no significant extracardiac vascular findings.  Mediastinum/Nodes: There are no enlarged lymph nodes within the visualized mediastinum.  Lungs/Pleura: There is no pleural effusion. The visualized lungs appear clear.  Upper abdomen: No significant findings in the visualized  upper abdomen.  Musculoskeletal/Chest wall: No chest wall mass or suspicious osseous findings within the visualized chest.  IMPRESSION: No significant extracardiac findings within the visualized chest.   Electronically Signed By: Oneil Devonshire M.D. On: 01/01/2023 01:56  Narrative CLINICAL DATA:  37F with diabetes, hypertension, hyperlipidemia, coronary calcification and abnormal EKG.  EXAM: Cardiac/Coronary  CT  TECHNIQUE: The patient was scanned on a [employer].  FINDINGS: A 120 kV prospective scan was triggered in the descending thoracic aorta at 111 HU's. Axial non-contrast 3 mm slices were carried out through the heart. The data set was analyzed on a dedicated work station and scored using the Agatson method. Gantry rotation speed was 250 msecs and collimation was .6 mm. No beta blockade and 0.8 mg of sl NTG was given. The 3D data set was reconstructed in 5% intervals of the 67-82 % of the R-R cycle. Diastolic phases were analyzed on a dedicated work station using MPR, MIP and VRT modes. The patient received 80 cc of contrast.  Aorta: Normal size.  Aortic atherosclerosis.  No dissection.  Aortic Valve:  Trileaflet.  No calcifications.  Coronary Arteries:  Normal coronary origin.  Right dominance.  RCA is a large dominant artery that gives rise to PDA and PLVB. There  is no plaque.  Left main is a large artery that gives rise to LAD and LCX arteries.  LAD is a large vessel that has minimal (<25%) calcified plaque proximally. D1 is a large vessel with minimal (<25%) calcified plaque proximally.  LCX is a non-dominant artery that gives rise to one large OM1 branch and a small OM2. There is minimal (<25%) calcified plaque in the proximal OM1.  Coronary Calcium  Score:  Left main: 0  Left anterior descending artery: 11.2  Left circumflex artery: 0.156  Right coronary artery: 0  Total: 11.4  Percentile: 51st  Other findings:  Normal  pulmonary vein drainage into the left atrium.  Normal let atrial appendage without a thrombus.  Normal size of the pulmonary artery.  The full cardiac cycle is not evaluated. However, there appears to be severe apical hypertrophy (1.8cm) consistent with apical variant hypertrophic cardiomyopathy. Suggest echo or cardiac MRI to better evaluate.  Non-cardiac: See separate report from [facility].  IMPRESSION: 1. Coronary calcium  score of 11.4. This was 51st percentile for age-, sex, and race-matched controls.  2. Total plaque volume 147 mm3 which is 51st percentile for age- and sex-matched controls (calcified plaque 18 mm3; non-calcified plaque 129 mm3). TPV is moderate.  3. Normal coronary origin with right dominance.  4. There is minimal (<25%) mixed plaque in the proximal LAD, D1, and proximal LCX. CAD-RADS 1.  5. The full cardiac cycle is not evaluated. However, there appears to be severe apical hypertrophy (1.8cm) consistent with apical variant hypertrophic cardiomyopathy. Suggest echo or cardiac MRI to better evaluate.  RECOMMENDATIONS: 1. CAD-RADS 0: No evidence of CAD (0%). Consider non-atherosclerotic causes of chest pain.  2. CAD-RADS 1: Minimal non-obstructive CAD (0-24%). Consider non-atherosclerotic causes of chest pain. Consider preventive therapy and risk factor modification.  3. CAD-RADS 2: Mild non-obstructive CAD (25-49%). Consider non-atherosclerotic causes of chest pain. Consider preventive therapy and risk factor modification.  4. CAD-RADS 3: Moderate stenosis. Consider symptom-guided anti-ischemic pharmacotherapy as well as risk factor modification per guideline directed care. Additional analysis with CT FFR will be submitted.  5. CAD-RADS 4: Severe stenosis. (70-99% or > 50% left main). Cardiac catheterization or CT FFR is recommended. Consider symptom-guided anti-ischemic pharmacotherapy as well as risk factor modification per  guideline directed care. Invasive coronary angiography recommended with revascularization per published guideline statements.  6. CAD-RADS 5: Total coronary occlusion (100%). Consider cardiac catheterization or viability assessment. Consider symptom-guided anti-ischemic pharmacotherapy as well as risk factor modification per guideline directed care.  7. CAD-RADS N: Non-diagnostic study. Obstructive CAD can't be excluded. Alternative evaluation is recommended.  Annabella Scarce, MD  Electronically Signed: By: Annabella Scarce M.D. On: 12/18/2022 11:41     ______________________________________________________________________________________________          Recent Labs: 12/10/2023: ALT 19; BUN 9; Creatinine, Ser 0.92; Hemoglobin 12.8; Platelets 179; Potassium 3.8; Sodium 132; TSH 6.910  Recent Lipid Panel    Component Value Date/Time   CHOL 157 12/10/2023 1019   TRIG 234 (H) 12/10/2023 1019   HDL 45 12/10/2023 1019   CHOLHDL 3.5 12/10/2023 1019   LDLCALC 74 12/10/2023 1019    Physical Exam:    VS:  BP (!) 162/54 (BP Location: Right Arm, Patient Position: Supine)   Pulse 68   Ht 5' 4 (1.626 m)   Wt 164 lb 2 oz (74.4 kg)   SpO2 98%   BMI 28.17 kg/m     Wt Readings from Last 3 Encounters:  02/27/24 164 lb 2 oz (74.4 kg)  02/27/24 163 lb 9.6 oz (74.2 kg)  01/28/24 164 lb 3.2 oz (74.5 kg)     GEN: Anxious well nourished, well developed in no acute distress HEENT: Normal NECK: No JVD; No carotid bruits LYMPHATICS: No lymphadenopathy CARDIAC: RRR, no murmurs, rubs, gallops RESPIRATORY:  Clear to auscultation without rales, wheezing or rhonchi  ABDOMEN: Soft, non-tender, non-distended MUSCULOSKELETAL:  No edema; No deformity  SKIN: Warm and dry NEUROLOGIC:  Alert and oriented x 3 PSYCHIATRIC:  Normal affect    Signed, Cassandra Leiter, MD  02/27/2024 5:16 PM    [facility]      [1]  Current Meds  Medication Sig   ALPRAZolam   (XANAX ) 0.5 MG tablet Take 1 tablet (0.5 mg total) by mouth 2 (two) times daily as needed for anxiety.   aspirin EC 81 MG tablet Take 81 mg by mouth daily.   budesonide -glycopyrrolate -formoterol  (BREZTRI  AEROSPHERE) 160-9-4.8 MCG/ACT AERO inhaler Inhale 2 puffs into the lungs 2 (two) times daily.   Famotidine (PEPCID PO) Take 20 mg by mouth daily.   gabapentin  (NEURONTIN ) 300 MG capsule Take 1 capsule (300 mg total) by mouth at bedtime.   gatifloxacin  (ZYMAXID ) 0.5 % SOLN Place 1 drop into the left eye 4 (four) times daily as directed. Store upside down!   glucose blood (ONETOUCH VERIO) test strip USE TO CHECK BLOOD GLUCOSE TWICE DAILY   ketorolac  (ACULAR ) 0.5 % ophthalmic solution Place 1 drop into the left eye 2 (two) times daily as directed   levothyroxine  (SYNTHROID ) 88 MCG tablet Take 1 tablet (88 mcg total) by mouth daily.   loratadine (CLARITIN) 10 MG tablet    losartan -hydrochlorothiazide  (HYZAAR ) 100-25 MG tablet Take 1 tablet by mouth daily.   metFORMIN  (GLUCOPHAGE ) 500 MG tablet Take 1 tablet (500 mg total) by mouth 2 (two) times daily.   metoprolol  succinate (TOPROL -XL) 50 MG 24 hr tablet Take 1 tablet (50 mg total) by mouth daily. Take with or immediately following a meal.   Multiple Vitamins-Minerals (OCUVITE PRESERVISION PO) Take 1 tablet by mouth daily.   Omega-3 Fatty Acids (FISH OIL) 1000 MG CAPS Take 1,000 mg by mouth 2 (two) times daily.   Probiotic Product (ALIGN PO) Take by mouth.   rosuvastatin  (CRESTOR ) 10 MG tablet Take 1 tablet (10 mg total) by mouth daily.   Vilazodone  HCl 20 MG TABS Take 1 tablet (20 mg total) by mouth daily. Start 20 mg rx after taking 10 mg rx for 10 days.   Vitamin D , Cholecalciferol, 1000 units CAPS Take 1 tablet by mouth daily.   "

## 2024-02-28 ENCOUNTER — Other Ambulatory Visit: Payer: Self-pay | Admitting: Physician Assistant

## 2024-02-28 ENCOUNTER — Ambulatory Visit

## 2024-02-28 ENCOUNTER — Ambulatory Visit: Payer: Self-pay | Admitting: Physician Assistant

## 2024-02-28 ENCOUNTER — Other Ambulatory Visit (HOSPITAL_BASED_OUTPATIENT_CLINIC_OR_DEPARTMENT_OTHER): Payer: Self-pay

## 2024-02-28 ENCOUNTER — Ambulatory Visit: Admitting: Cardiology

## 2024-02-28 DIAGNOSIS — R319 Hematuria, unspecified: Secondary | ICD-10-CM

## 2024-02-28 LAB — CBC WITH DIFFERENTIAL/PLATELET
Basophils Absolute: 0.1 10*3/uL (ref 0.0–0.2)
Basos: 1 %
EOS (ABSOLUTE): 0.4 10*3/uL (ref 0.0–0.4)
Eos: 3 %
Hematocrit: 38.6 % (ref 34.0–46.6)
Hemoglobin: 12.7 g/dL (ref 11.1–15.9)
Immature Grans (Abs): 0 10*3/uL (ref 0.0–0.1)
Immature Granulocytes: 0 %
Lymphocytes Absolute: 3.1 10*3/uL (ref 0.7–3.1)
Lymphs: 23 %
MCH: 29.4 pg (ref 26.6–33.0)
MCHC: 32.9 g/dL (ref 31.5–35.7)
MCV: 89 fL (ref 79–97)
Monocytes Absolute: 1.4 10*3/uL — ABNORMAL HIGH (ref 0.1–0.9)
Monocytes: 10 %
Neutrophils Absolute: 8.4 10*3/uL — ABNORMAL HIGH (ref 1.4–7.0)
Neutrophils: 63 %
Platelets: 299 10*3/uL (ref 150–450)
RBC: 4.32 x10E6/uL (ref 3.77–5.28)
RDW: 13.5 % (ref 11.7–15.4)
WBC: 13.5 10*3/uL — ABNORMAL HIGH (ref 3.4–10.8)

## 2024-02-28 LAB — POCT URINALYSIS DIP (CLINITEK)
Bilirubin, UA: NEGATIVE
Glucose, UA: NEGATIVE mg/dL
Ketones, POC UA: NEGATIVE mg/dL
Leukocytes, UA: NEGATIVE
Nitrite, UA: NEGATIVE
Spec Grav, UA: <=1.005 — AB
Urobilinogen, UA: 0.2 U/dL
pH, UA: 6.5

## 2024-02-28 LAB — COMPREHENSIVE METABOLIC PANEL WITH GFR
ALT: 21 [IU]/L (ref 0–32)
AST: 22 [IU]/L (ref 0–40)
Albumin: 4.5 g/dL (ref 3.9–4.9)
Alkaline Phosphatase: 84 [IU]/L (ref 49–135)
BUN/Creatinine Ratio: 25 (ref 12–28)
BUN: 19 mg/dL (ref 8–27)
Bilirubin Total: 0.3 mg/dL (ref 0.0–1.2)
CO2: 27 mmol/L (ref 20–29)
Calcium: 10.1 mg/dL (ref 8.7–10.3)
Chloride: 92 mmol/L — ABNORMAL LOW (ref 96–106)
Creatinine, Ser: 0.76 mg/dL (ref 0.57–1.00)
Globulin, Total: 3.1 g/dL (ref 1.5–4.5)
Glucose: 93 mg/dL (ref 70–99)
Potassium: 4.1 mmol/L (ref 3.5–5.2)
Sodium: 137 mmol/L (ref 134–144)
Total Protein: 7.6 g/dL (ref 6.0–8.5)
eGFR: 84 mL/min/{1.73_m2}

## 2024-02-28 LAB — TSH: TSH: 1.86 u[IU]/mL (ref 0.450–4.500)

## 2024-02-28 MED ORDER — CIPROFLOXACIN HCL 500 MG PO TABS: 500.0000 mg | ORAL_TABLET | Freq: Two times a day (BID) | ORAL | 0 refills | Status: AC

## 2024-02-28 MED ORDER — VILAZODONE HCL 20 MG PO TABS
20.0000 mg | ORAL_TABLET | Freq: Every day | ORAL | 2 refills | Status: AC
  Filled 2024-02-28: qty 30, 30d supply, fill #0

## 2024-02-28 NOTE — Progress Notes (Signed)
 Patient is in office today for a nurse visit for Repeat UA. Patient UA was  positive for RBCs and positive for protein, trace of both. Patient stated that she has had some lower abd cramping.

## 2024-03-03 ENCOUNTER — Ambulatory Visit: Admitting: Physician Assistant

## 2024-03-20 ENCOUNTER — Ambulatory Visit (HOSPITAL_COMMUNITY)

## 2024-04-16 ENCOUNTER — Ambulatory Visit: Admitting: Physician Assistant
